# Patient Record
Sex: Male | Born: 1940
Health system: Southern US, Community
[De-identification: ages and names within clinical notes are randomized; demographics above are authoritative.]

## PROBLEM LIST (undated history)

## (undated) DIAGNOSIS — N189 Chronic kidney disease, unspecified: Secondary | ICD-10-CM

## (undated) DIAGNOSIS — H539 Unspecified visual disturbance: Secondary | ICD-10-CM

## (undated) DIAGNOSIS — I10 Essential (primary) hypertension: Secondary | ICD-10-CM

## (undated) DIAGNOSIS — I2510 Atherosclerotic heart disease of native coronary artery without angina pectoris: Secondary | ICD-10-CM

## (undated) DIAGNOSIS — E669 Obesity, unspecified: Secondary | ICD-10-CM

## (undated) DIAGNOSIS — M109 Gout, unspecified: Secondary | ICD-10-CM

## (undated) DIAGNOSIS — M2550 Pain in unspecified joint: Secondary | ICD-10-CM

## (undated) DIAGNOSIS — I77810 Thoracic aortic ectasia: Secondary | ICD-10-CM

## (undated) DIAGNOSIS — I351 Nonrheumatic aortic (valve) insufficiency: Secondary | ICD-10-CM

## (undated) DIAGNOSIS — H409 Unspecified glaucoma: Secondary | ICD-10-CM

## (undated) DIAGNOSIS — R7301 Impaired fasting glucose: Secondary | ICD-10-CM

## (undated) DIAGNOSIS — E7800 Pure hypercholesterolemia, unspecified: Secondary | ICD-10-CM

## (undated) DIAGNOSIS — K760 Fatty (change of) liver, not elsewhere classified: Secondary | ICD-10-CM

## (undated) DIAGNOSIS — E785 Hyperlipidemia, unspecified: Secondary | ICD-10-CM

## (undated) DIAGNOSIS — I739 Peripheral vascular disease, unspecified: Secondary | ICD-10-CM

## (undated) DIAGNOSIS — R591 Generalized enlarged lymph nodes: Secondary | ICD-10-CM

## (undated) HISTORY — DX: Atherosclerotic heart disease of native coronary artery without angina pectoris: I25.10

## (undated) HISTORY — DX: Obesity, unspecified: E66.9

## (undated) HISTORY — DX: Nonrheumatic aortic (valve) insufficiency: I35.1

## (undated) HISTORY — DX: Unspecified glaucoma: H40.9

## (undated) HISTORY — DX: Hyperlipidemia, unspecified: E78.5

## (undated) HISTORY — DX: Peripheral vascular disease, unspecified: I73.9

## (undated) HISTORY — DX: Thoracic aortic ectasia: I77.810

## (undated) HISTORY — DX: Fatty (change of) liver, not elsewhere classified: K76.0

## (undated) HISTORY — DX: Generalized enlarged lymph nodes: R59.1

## (undated) HISTORY — DX: Essential (primary) hypertension: I10

## (undated) HISTORY — DX: Unspecified visual disturbance: H53.9

## (undated) HISTORY — PX: WISDOM TOOTH EXTRACTION: SHX21

## (undated) HISTORY — DX: Gout, unspecified: M10.9

## (undated) HISTORY — DX: Pure hypercholesterolemia, unspecified: E78.00

## (undated) HISTORY — PX: EYE SURGERY: SHX253

## (undated) HISTORY — DX: Impaired fasting glucose: R73.01

## (undated) HISTORY — PX: ADENOIDECTOMY: SUR15

## (undated) HISTORY — PX: CATARACT EXTRACTION: SUR2

## (undated) HISTORY — DX: Pain in unspecified joint: M25.50

## (undated) HISTORY — PX: TONSILLECTOMY: SUR1361

---

## 2001-03-02 ENCOUNTER — Ambulatory Visit (HOSPITAL_COMMUNITY): Admission: RE | Admit: 2001-03-02 | Discharge: 2001-03-03 | Payer: Self-pay | Admitting: Cardiology

## 2005-10-28 ENCOUNTER — Encounter: Admission: RE | Admit: 2005-10-28 | Discharge: 2006-01-26 | Payer: Self-pay | Admitting: Family Medicine

## 2005-10-31 ENCOUNTER — Ambulatory Visit (HOSPITAL_COMMUNITY): Admission: RE | Admit: 2005-10-31 | Discharge: 2005-10-31 | Payer: Self-pay | Admitting: Gastroenterology

## 2007-09-13 HISTORY — PX: CORONARY ANGIOPLASTY WITH STENT PLACEMENT: SHX49

## 2010-08-13 ENCOUNTER — Encounter: Payer: Self-pay | Admitting: Cardiology

## 2011-02-11 ENCOUNTER — Ambulatory Visit (HOSPITAL_COMMUNITY)
Admission: RE | Admit: 2011-02-11 | Discharge: 2011-02-11 | Disposition: A | Discharge status: Home / Self Care | Payer: BC Managed Care – PPO | Source: Ambulatory Visit | Attending: Orthopedic Surgery | Admitting: Orthopedic Surgery

## 2011-02-11 DIAGNOSIS — M7989 Other specified soft tissue disorders: Secondary | ICD-10-CM | POA: Insufficient documentation

## 2013-07-22 ENCOUNTER — Ambulatory Visit (INDEPENDENT_AMBULATORY_CARE_PROVIDER_SITE_OTHER): Payer: BC Managed Care – PPO | Admitting: *Deleted

## 2013-07-22 DIAGNOSIS — E7800 Pure hypercholesterolemia, unspecified: Secondary | ICD-10-CM

## 2013-07-22 LAB — ALT: ALT: 67 U/L — ABNORMAL HIGH (ref 0–53)

## 2013-07-23 LAB — NMR LIPOPROFILE WITH LIPIDS
Cholesterol, Total: 135 mg/dL (ref <200)
HDL Particle Number: 34.3 umol/L (ref >=30.5)
HDL Size: 8.6 nm — ABNORMAL LOW (ref >=9.2)
HDL-C: 37 mg/dL — ABNORMAL LOW (ref >=40)
LDL Particle Number: 785 nmol/L (ref <1000)
Triglycerides: 259 mg/dL — ABNORMAL HIGH (ref <150)

## 2013-07-24 ENCOUNTER — Other Ambulatory Visit: Payer: Self-pay | Admitting: General Surgery

## 2013-07-24 ENCOUNTER — Encounter: Payer: Self-pay | Admitting: General Surgery

## 2013-07-24 DIAGNOSIS — I2510 Atherosclerotic heart disease of native coronary artery without angina pectoris: Secondary | ICD-10-CM

## 2013-07-24 DIAGNOSIS — Z79899 Other long term (current) drug therapy: Secondary | ICD-10-CM

## 2013-08-12 DIAGNOSIS — I2510 Atherosclerotic heart disease of native coronary artery without angina pectoris: Secondary | ICD-10-CM

## 2013-08-12 HISTORY — DX: Atherosclerotic heart disease of native coronary artery without angina pectoris: I25.10

## 2013-08-19 ENCOUNTER — Telehealth: Payer: Self-pay | Admitting: Cardiology

## 2013-08-19 NOTE — Telephone Encounter (Signed)
Called pt and let know I would mail labs. He would have to call IT for cone about id and password for [facility]

## 2013-08-19 NOTE — Telephone Encounter (Signed)
Follow up    Pt called for last lab results to be mailed to him as well as his MY Chart  ID& Pass word.  Thanks!

## 2013-08-30 ENCOUNTER — Ambulatory Visit: Payer: BC Managed Care – PPO | Admitting: Cardiology

## 2013-09-09 ENCOUNTER — Encounter: Payer: Self-pay | Admitting: General Surgery

## 2013-09-09 DIAGNOSIS — I10 Essential (primary) hypertension: Secondary | ICD-10-CM | POA: Insufficient documentation

## 2013-09-09 DIAGNOSIS — I2510 Atherosclerotic heart disease of native coronary artery without angina pectoris: Secondary | ICD-10-CM | POA: Insufficient documentation

## 2013-09-09 DIAGNOSIS — I119 Hypertensive heart disease without heart failure: Secondary | ICD-10-CM

## 2013-09-09 DIAGNOSIS — E7800 Pure hypercholesterolemia, unspecified: Secondary | ICD-10-CM

## 2013-09-09 HISTORY — DX: Essential (primary) hypertension: I10

## 2013-09-09 HISTORY — DX: Pure hypercholesterolemia, unspecified: E78.00

## 2013-09-24 ENCOUNTER — Ambulatory Visit (INDEPENDENT_AMBULATORY_CARE_PROVIDER_SITE_OTHER): Payer: BC Managed Care – PPO | Admitting: Cardiology

## 2013-09-24 ENCOUNTER — Encounter: Payer: Self-pay | Admitting: Cardiology

## 2013-09-24 VITALS — BP 140/82 | HR 65 | Ht 76.0 in | Wt 293.8 lb

## 2013-09-24 DIAGNOSIS — I2510 Atherosclerotic heart disease of native coronary artery without angina pectoris: Secondary | ICD-10-CM

## 2013-09-24 DIAGNOSIS — I10 Essential (primary) hypertension: Secondary | ICD-10-CM

## 2013-09-24 DIAGNOSIS — R7401 Elevation of levels of liver transaminase levels: Secondary | ICD-10-CM

## 2013-09-24 DIAGNOSIS — R740 Nonspecific elevation of levels of transaminase and lactic acid dehydrogenase [LDH]: Secondary | ICD-10-CM

## 2013-09-24 DIAGNOSIS — E7800 Pure hypercholesterolemia, unspecified: Secondary | ICD-10-CM

## 2013-09-24 DIAGNOSIS — R7402 Elevation of levels of lactic acid dehydrogenase (LDH): Secondary | ICD-10-CM

## 2013-09-24 MED ORDER — VALSARTAN 320 MG PO TABS: 320.0000 mg | ORAL_TABLET | Freq: Every day | ORAL | Status: DC

## 2013-09-24 MED ORDER — ATORVASTATIN CALCIUM 80 MG PO TABS: 80.0000 mg | ORAL_TABLET | Freq: Every day | ORAL | Status: DC

## 2013-09-24 MED ORDER — METOPROLOL SUCCINATE ER 100 MG PO TB24: 100.0000 mg | ORAL_TABLET | Freq: Every day | ORAL | Status: DC

## 2013-09-24 NOTE — Patient Instructions (Signed)
Your physician recommends that you continue on your current medications as directed. Please refer to the Current Medication list given to you today.  You have been referred to [facility] [address], [address] [place] [address] Phone: [phone] Hours: Open Monday through Friday from 8:30 am to 5:00 pm They will be contacting you for an appointment within the next week.   Your physician wants you to follow-up in: 1 Year You will receive a reminder letter in the mail two months in advance. If you don't receive a letter, please call our office to schedule the follow-up appointment.

## 2013-09-24 NOTE — Progress Notes (Signed)
[place], [place], [place]  [address] Phone: [phone] Fax:  [phone]  Date:  09/24/2013   ID:  Marcus [name], DOB July 11, 1941, MRN [id number]  PCP:  Gennette Pac, MD  Cardiologist:  Fransico Him, MD     History of Present Illness: Marcus [name] is a 73 y.o. male with a history of ASCAD, HTN and dyslipidemia.  He is doing well.  He denies any chest pain, SOB, DOE, LE edema, dizziness, palpitations or syncope.  He has had a persistent cough due to chronic sinus drainage.  He is not getting any aerobic exercise at present.     Wt Readings from Last 3 Encounters:  09/24/13 293 lb 12.8 oz (133.267 kg)  09/09/13 286 lb (129.729 kg)     Past Medical History  Diagnosis Date  . Coronary artery disease     S/P PCI of the LAD  . Hypertension   . Dyslipidemia   . Obesity   . Gout     Current Outpatient Prescriptions  Medication Sig Dispense Refill  . allopurinol (ZYLOPRIM) 300 MG tablet Take 300 mg by mouth daily.      Marland Kitchen aspirin 81 MG tablet Take 81 mg by mouth daily.      Marland Kitchen atorvastatin (LIPITOR) 80 MG tablet Take 80 mg by mouth daily.      . Dorzolamide HCl-Timolol Mal (COSOPT OP) Apply 1 drop to eye 2 (two) times daily.      . metoprolol succinate (TOPROL-XL) 100 MG 24 hr tablet Take 100 mg by mouth daily. Take with or immediately following a meal.      . nitroGLYCERIN (NITROSTAT) 0.4 MG SL tablet Place 0.4 mg under the tongue every 5 (five) minutes as needed for chest pain.      . Omega-3 Fatty Acids (FISH OIL) 1000 MG CAPS Take 9 capsules by mouth daily.       . valsartan (DIOVAN) 320 MG tablet Take 320 mg by mouth daily.       No current facility-administered medications for this visit.    Allergies:   No Known Allergies  Social History:  The patient  reports that he has been smoking Cigars.  He does not have any smokeless tobacco history on file. He reports that he drinks alcohol. He reports that he does not use illicit drugs.   Family History:   The patient's family history is not on file.   ROS:  Please see the history of present illness.      All other systems reviewed and negative.   PHYSICAL EXAM: VS:  BP 140/82  Pulse 65  Ht _0  (1.93 m)  Wt 293 lb 12.8 oz (133.267 kg)  BMI 35.78 kg/m2 Well nourished, well developed, in no acute distress HEENT: normal Neck: no JVD Cardiac:  normal S1, S2; RRR; no murmur Lungs:  clear to auscultation bilaterally, no wheezing, rhonchi or rales Abd: soft, nontender, no hepatomegaly Ext: no edema Skin: warm and dry Neuro:  CNs 2-12 intact, no focal abnormalities noted  EKG:   NSR with no ST changes    ASSESSMENT AND PLAN:  1. ASCAD   - continue ASA 2. HTN - well controlled  - continue valsartan/metoprolol 3. Dyslipidemia  - continue Atrovastatin/fish oil 4. Obesity with elevated ALT which I suspect is from fatty liver.  We had a long discussion about carrying a lot of weight around his abdomen.  I have strongly encouraged him to join a gym and get into a  routine exercise program and consider weight watchers.  We will recheck his ALT after stopping all alcohol and no tylenol. 5. Elevated ALT -most likely due to fatty liver but will refer to GI  Followup with me in 1 year  Signed, Fransico Him, MD 09/24/2013 8:23 AM

## 2013-12-04 ENCOUNTER — Other Ambulatory Visit: Payer: Self-pay | Admitting: Gastroenterology

## 2013-12-04 DIAGNOSIS — R109 Unspecified abdominal pain: Secondary | ICD-10-CM

## 2013-12-30 ENCOUNTER — Ambulatory Visit
Admission: RE | Admit: 2013-12-30 | Discharge: 2013-12-30 | Disposition: A | Discharge status: Home / Self Care | Payer: BC Managed Care – PPO | Source: Ambulatory Visit | Attending: Gastroenterology | Admitting: Gastroenterology

## 2013-12-30 DIAGNOSIS — R109 Unspecified abdominal pain: Secondary | ICD-10-CM

## 2014-01-16 ENCOUNTER — Telehealth: Payer: Self-pay | Admitting: Cardiology

## 2014-01-16 NOTE — Telephone Encounter (Signed)
New message     Question about upcoming lab test

## 2014-01-16 NOTE — Telephone Encounter (Signed)
Called pt back and let him know what lab test he is scheduled for.

## 2014-01-21 ENCOUNTER — Other Ambulatory Visit: Payer: BC Managed Care – PPO

## 2014-01-27 ENCOUNTER — Other Ambulatory Visit (INDEPENDENT_AMBULATORY_CARE_PROVIDER_SITE_OTHER): Payer: BC Managed Care – PPO

## 2014-01-27 DIAGNOSIS — I2510 Atherosclerotic heart disease of native coronary artery without angina pectoris: Secondary | ICD-10-CM

## 2014-01-27 DIAGNOSIS — Z79899 Other long term (current) drug therapy: Secondary | ICD-10-CM

## 2014-01-27 LAB — ALT: ALT: 74 U/L — ABNORMAL HIGH (ref 0–53)

## 2014-01-28 LAB — NMR LIPOPROFILE WITH LIPIDS
CHOLESTEROL, TOTAL: 105 mg/dL (ref <200)
HDL Particle Number: 26.2 umol/L — ABNORMAL LOW (ref >=30.5)
HDL SIZE: 8.5 nm — AB (ref >=9.2)
HDL-C: 29 mg/dL — AB (ref >=40)
LDL (calc): 42 mg/dL (ref <100)
LDL Particle Number: 649 nmol/L (ref <1000)
LDL SIZE: 19.6 nm — AB (ref >20.5)
LP-IR SCORE: 52 — AB (ref <=45)
Large HDL-P: <1.3 umol/L — ABNORMAL LOW (ref >=4.8)
Large VLDL-P: 1.3 nmol/L (ref <=2.7)
SMALL LDL PARTICLE NUMBER: 510 nmol/L (ref <=527)
Triglycerides: 169 mg/dL — ABNORMAL HIGH (ref <150)
VLDL SIZE: 42.4 nm (ref <=46.6)

## 2014-01-29 ENCOUNTER — Encounter: Payer: Self-pay | Admitting: General Surgery

## 2014-01-29 ENCOUNTER — Other Ambulatory Visit: Payer: Self-pay | Admitting: General Surgery

## 2014-01-29 DIAGNOSIS — E7800 Pure hypercholesterolemia, unspecified: Secondary | ICD-10-CM

## 2014-04-09 ENCOUNTER — Encounter: Payer: Self-pay | Admitting: Cardiology

## 2014-07-31 ENCOUNTER — Telehealth: Payer: Self-pay | Admitting: Cardiology

## 2014-07-31 NOTE — Telephone Encounter (Signed)
Please get a copy of patient's last BMET from PCP

## 2014-08-01 ENCOUNTER — Other Ambulatory Visit (INDEPENDENT_AMBULATORY_CARE_PROVIDER_SITE_OTHER): Payer: BC Managed Care – PPO | Admitting: *Deleted

## 2014-08-01 DIAGNOSIS — E7800 Pure hypercholesterolemia, unspecified: Secondary | ICD-10-CM

## 2014-08-01 LAB — HEPATIC FUNCTION PANEL
ALT: 41 U/L (ref 0–53)
AST: 34 U/L (ref 0–37)
Albumin: 3.7 g/dL (ref 3.5–5.2)
Alkaline Phosphatase: 63 U/L (ref 39–117)
Bilirubin, Direct: 0 mg/dL (ref 0.0–0.3)
Total Bilirubin: 0.8 mg/dL (ref 0.2–1.2)
Total Protein: 7.3 g/dL (ref 6.0–8.3)

## 2014-08-01 NOTE — Telephone Encounter (Signed)
Called Dr. Eddie Dibbles office medical records who st they will fax it over.

## 2014-08-04 ENCOUNTER — Other Ambulatory Visit: Payer: Self-pay | Admitting: Cardiology

## 2014-08-04 LAB — NMR LIPOPROFILE WITH LIPIDS
CHOLESTEROL, TOTAL: 105 mg/dL (ref 100–199)
HDL PARTICLE NUMBER: 25.2 umol/L — AB (ref >=30.5)
HDL Size: 8.7 nm — ABNORMAL LOW (ref >=9.2)
HDL-C: 26 mg/dL — ABNORMAL LOW (ref >39)
LDL CALC: 43 mg/dL (ref 0–99)
LDL Particle Number: 476 nmol/L (ref <1000)
LDL Size: 20 nm (ref >=20.8)
LP-IR Score: 52 — ABNORMAL HIGH (ref <=45)
Large VLDL-P: 2.3 nmol/L (ref <=2.7)
SMALL LDL PARTICLE NUMBER: 279 nmol/L (ref <=527)
Triglycerides: 182 mg/dL — ABNORMAL HIGH (ref 0–149)
VLDL SIZE: 40.4 nm (ref <=46.6)

## 2014-08-06 ENCOUNTER — Telehealth: Payer: Self-pay | Admitting: Cardiology

## 2014-08-06 DIAGNOSIS — E7800 Pure hypercholesterolemia, unspecified: Secondary | ICD-10-CM

## 2014-08-06 NOTE — Telephone Encounter (Signed)
Patient informed of results and verbal understanding expressed.  Informed patient fasting labs need to be redrawn in a year.  Labs ordered and patient to call back at his convenience to make an appointment.

## 2014-08-06 NOTE — Telephone Encounter (Signed)
New Msg   Patient returning call. Please contact at [phone].

## 2014-08-06 NOTE — Telephone Encounter (Signed)
-----  Message from Aris Georgia, [facility] sent at 08/04/2014 11:59 AM EST ----- RF: CAD, insulin resistance, age - LDL goal < 70, non-HDL goal < 100, LDL-P goal < 1000. Meds: Atorvastatin 80 mg qd, fish oil 4 daily (h/o not taking fish oil). LDL, non-HDL, and LDL-P all at goal currently. TG remain slightly elevated ~ 146m/dL. ALT historically b/t 60-75 U/L, but normal today at 41 Plan: 1. Continue atorvastatin 80 mg qd and fish oil  2. Recheck NMR LipoProfile and hepatic panel in 12 months. Please notify patient, and set up labs. Thanks.

## 2014-08-18 ENCOUNTER — Encounter: Payer: Self-pay | Admitting: Cardiology

## 2014-08-28 ENCOUNTER — Telehealth: Payer: Self-pay | Admitting: Cardiology

## 2014-08-28 NOTE — Telephone Encounter (Signed)
New message      Talk to Katy----pt is aware she is out today and want to wait until she returns

## 2014-08-28 NOTE — Telephone Encounter (Signed)
Message sent to [facility]

## 2014-08-29 NOTE — Telephone Encounter (Signed)
Pt extremely upset because he got blood work done 11/20 and he recently received a bill from [facility] for over $70. Patient st that the order was placed incorrectly, and that [facility] should pay for it. He has gotten labs drawn here for years and has never gotten a bill. Apologized to patient that he is so angry and told him it will be looked into. Will F/U with patient next week.

## 2014-09-01 NOTE — Telephone Encounter (Signed)
Left detailed voicemail for patient.  I will be in [facility] [address] tomorrow and I explained in voicemail that in the morning I will call [facility] and see if they can give me an explanation of charges and that I will return his call before lunchtime.  Hopefully we can resolve some confusion.

## 2014-09-01 NOTE — Telephone Encounter (Signed)
I left him a voicemail to call me back

## 2014-10-06 ENCOUNTER — Ambulatory Visit: Payer: BC Managed Care – PPO | Admitting: Cardiology

## 2014-10-23 ENCOUNTER — Ambulatory Visit (INDEPENDENT_AMBULATORY_CARE_PROVIDER_SITE_OTHER): Payer: BLUE CROSS/BLUE SHIELD | Admitting: Cardiology

## 2014-10-23 ENCOUNTER — Encounter: Payer: Self-pay | Admitting: Cardiology

## 2014-10-23 VITALS — BP 134/80 | HR 60 | Ht 76.0 in | Wt 290.8 lb

## 2014-10-23 DIAGNOSIS — I2510 Atherosclerotic heart disease of native coronary artery without angina pectoris: Secondary | ICD-10-CM | POA: Diagnosis not present

## 2014-10-23 DIAGNOSIS — E7800 Pure hypercholesterolemia, unspecified: Secondary | ICD-10-CM

## 2014-10-23 DIAGNOSIS — I10 Essential (primary) hypertension: Secondary | ICD-10-CM

## 2014-10-23 NOTE — Patient Instructions (Signed)
Your physician recommends that you continue on your current medications as directed. Please refer to the Current Medication list given to you today.  Your physician wants you to follow-up in: 1 year with Dr. Radford Pax. You will receive a reminder letter in the mail two months in advance. If you don't receive a letter, please call our office to schedule the follow-up appointment.

## 2014-10-23 NOTE — Progress Notes (Signed)
Cardiology Office Note   Date:  10/23/2014   ID:  Marcus [name], DOB Apr 06, 1941, MRN [id number]  PCP:  Gennette Pac, MD  Cardiologist:   Sueanne Margarita, MD   Chief Complaint  Patient presents with  . Coronary Artery Disease  . Hypertension  . Hyperlipidemia      History of Present Illness: Marcus [name] is a 74 y.o. male with a history of ASCAD, HTN and dyslipidemia.He has a history of elevated ALT and was evaluated by GI and US showed an enlarged liver.  He stopped ETOH and tylenol and  ALT normalized.  He is doing well. He denies any anginal chest pain, SOB, DOE, dizziness, palpitations or syncope. He has chronic LE edema which is stable.  He occasionally has some numbness and tingling in his LE.  He is not getting any aerobic exercise at present.      Past Medical History  Diagnosis Date  . Coronary artery disease     S/P PCI of the LAD  . Hypertension   . Dyslipidemia   . Obesity   . Gout   . LAD (lymphadenopathy)   . Hyperlipidemia   . Joint pain   . Elevated fasting glucose   . Fatty liver     Past Surgical History  Procedure Laterality Date  . Coronary angioplasty with stent placement  2009  . Wisdom tooth extraction    . Tonsillectomy    . Adenoidectomy       Current Outpatient Prescriptions  Medication Sig Dispense Refill  . allopurinol (ZYLOPRIM) 300 MG tablet Take 300 mg by mouth daily.    Marland Kitchen aspirin 81 MG tablet Take 81 mg by mouth daily.    Marland Kitchen atorvastatin (LIPITOR) 80 MG tablet TAKE 1 TABLET DAILY 90 tablet 0  . Dorzolamide HCl-Timolol Mal (COSOPT OP) Apply 1 drop to eye 2 (two) times daily.    . metoprolol succinate (TOPROL-XL) 100 MG 24 hr tablet TAKE 1 TABLET DAILY. TAKE WITH OR IMMEDIATELY FOLLOWING A MEAL. 90 tablet 0  . minocycline (MINOCIN,DYNACIN) 100 MG capsule Take 100 mg by mouth as needed. For skin acne  3  . nitroGLYCERIN (NITROSTAT) 0.4 MG SL tablet Place 0.4 mg under the tongue every 5 (five) minutes as needed for chest  pain.    . Omega-3 Fatty Acids (FISH OIL) 1000 MG CAPS Take 9 capsules by mouth daily.     . valsartan (DIOVAN) 320 MG tablet TAKE 1 TABLET DAILY 90 tablet 0   No current facility-administered medications for this visit.    Allergies:   Review of patient's allergies indicates no known allergies.    Social History:  The patient  reports that he has been smoking Cigars.  He does not have any smokeless tobacco history on file. He reports that he drinks alcohol. He reports that he does not use illicit drugs.   Family History:  The patient's family history includes Breast cancer in his mother; CAD in his brother and sister; Heart attack in his father; Rheumatic fever in his father.    ROS:  Please see the history of present illness.   Otherwise, review of systems are positive for none.   All other systems are reviewed and negative.    PHYSICAL EXAM: VS:  BP 134/80 mmHg  Pulse 60  Ht _0  (1.93 m)  Wt 290 lb 12.8 oz (131.906 kg)  BMI 35.41 kg/m2 , BMI Body mass index is 35.41 kg/(m^2). GEN: Well nourished, well developed, in no acute distress  HEENT: normal Neck: no JVD, carotid bruits, or masses Cardiac: RRR; no murmurs, rubs, or gallops, trace edema  Respiratory:  clear to auscultation bilaterally, normal work of breathing GI: soft, nontender, nondistended, + BS MS: no deformity or atrophy Skin: warm and dry, no rash Neuro:  Strength and sensation are intact Psych: euthymic mood, full affect   EKG:  EKG is ordered today. The ekg ordered today demonstrates NSR at 60bpm with IRBBB   Recent Labs: 08/01/2014: ALT 41    Lipid Panel    Component Value Date/Time   TRIG 182* 08/01/2014 0835   LDLCALC 43 08/01/2014 0835      Wt Readings from Last 3 Encounters:  10/23/14 290 lb 12.8 oz (131.906 kg)  09/24/13 293 lb 12.8 oz (133.267 kg)  09/09/13 286 lb (129.729 kg)       ASSESSMENT AND PLAN:  1. ASCAD- s/p remote LAD PCI - continue ASA 2. HTN - well  controlled - continue valsartan/metoprolol 3. Dyslipidemia - LDL at goal - continue Atrovastatin/fish oil 4. Obesity . We had a long discussion about carrying a lot of weight around his abdomen. I have strongly encouraged him to join a gym and get into a routine exercise program and consider weight watchers. 5. Elevated ALT - resolved after stopping ETOH 6. Numbness and tingling of LE - I recommended that he discuss this with his PCP    Current medicines are reviewed at length with the patient today.  The patient does not have concerns regarding medicines.  The following changes have been made:  no change  Labs/ tests ordered today include: None No orders of the defined types were placed in this encounter.     Disposition:   FU with me in 1 year   Signed, Sueanne Margarita, MD  10/23/2014 9:48 AM    [address], [place], [place]  [address] Phone: [phone]; Fax: [phone]

## 2014-11-02 ENCOUNTER — Other Ambulatory Visit: Payer: Self-pay | Admitting: Cardiology

## 2015-01-21 ENCOUNTER — Other Ambulatory Visit: Payer: Self-pay | Admitting: *Deleted

## 2015-01-21 MED ORDER — METOPROLOL SUCCINATE ER 100 MG PO TB24: ORAL_TABLET | ORAL | Status: DC

## 2015-01-21 MED ORDER — ATORVASTATIN CALCIUM 80 MG PO TABS: 80.0000 mg | ORAL_TABLET | Freq: Every day | ORAL | Status: DC

## 2015-01-21 MED ORDER — VALSARTAN 320 MG PO TABS: 320.0000 mg | ORAL_TABLET | Freq: Every day | ORAL | Status: DC

## 2015-01-21 NOTE — Telephone Encounter (Signed)
Rx refill sent to patient pharmacy

## 2016-01-11 ENCOUNTER — Encounter: Payer: Self-pay | Admitting: Cardiology

## 2016-02-02 ENCOUNTER — Other Ambulatory Visit: Payer: Self-pay | Admitting: Cardiology

## 2016-02-02 ENCOUNTER — Telehealth: Payer: Self-pay | Admitting: Cardiology

## 2016-02-02 DIAGNOSIS — E7800 Pure hypercholesterolemia, unspecified: Secondary | ICD-10-CM

## 2016-02-02 NOTE — Telephone Encounter (Signed)
Rx refill sent to pharmacy.

## 2016-02-02 NOTE — Telephone Encounter (Signed)
New Message  Pt sched OV w/ [facility]- 8/21 _0 - pt stated he would probably need lab work beforehand- scheduled labs for 04/18/16- no orders in syst. Please call back and discuss.

## 2016-03-26 NOTE — Telephone Encounter (Signed)
FLP and ALT

## 2016-03-28 NOTE — Telephone Encounter (Signed)
Left message for patient to come fasting to lab visit 8/7. Orders placed and linked to appointment.

## 2016-04-18 ENCOUNTER — Other Ambulatory Visit (INDEPENDENT_AMBULATORY_CARE_PROVIDER_SITE_OTHER): Payer: 59

## 2016-04-18 ENCOUNTER — Encounter: Payer: Self-pay | Admitting: *Deleted

## 2016-04-18 DIAGNOSIS — E7800 Pure hypercholesterolemia, unspecified: Secondary | ICD-10-CM

## 2016-04-18 LAB — LIPID PANEL
CHOLESTEROL: 104 mg/dL — AB (ref 125–200)
HDL: 28 mg/dL — AB (ref >=40)
LDL Cholesterol: 35 mg/dL (ref <130)
TRIGLYCERIDES: 207 mg/dL — AB (ref <150)
Total CHOL/HDL Ratio: 3.7 Ratio (ref <=5.0)
VLDL: 41 mg/dL — ABNORMAL HIGH (ref <30)

## 2016-04-18 LAB — ALT: ALT: 47 U/L — ABNORMAL HIGH (ref 9–46)

## 2016-05-01 NOTE — Progress Notes (Signed)
Cardiology Office Note    Date:  05/02/2016   ID:  Marcus [name], DOB 06/28/1941, MRN [id number]  PCP:  Gennette Pac, MD  Cardiologist:  Fransico Him, MD   Chief Complaint  Patient presents with  . Coronary Artery Disease  . Hypertension  . Hyperlipidemia    History of Present Illness:  Marcus [name] is a 75 y.o. male with a history of ASCAD, HTN and dyslipidemia.He has a history of elevated ALT and was evaluated by GI and US showed an enlarged liver.  He stopped ETOH and tylenol and  ALT normalized.  He is doing well. He denies any anginal chest pain, SOB, DOE, dizziness, palpitations or syncope. He has chronic LE edema which is stable.  He says that he has been having some numbness of his legs and feet but no pain with ambulation.    Past Medical History:  Diagnosis Date  . Benign essential HTN 09/09/2013  . Coronary artery disease 08/2013   S/P PCI of the LAD  . Dyslipidemia   . Elevated fasting glucose   . Fatty liver   . Gout   . Hyperlipidemia   . Hypertension   . Joint pain   . LAD (lymphadenopathy)   . Obesity   . Pure hypercholesterolemia 09/09/2013    Past Surgical History:  Procedure Laterality Date  . ADENOIDECTOMY    . CORONARY ANGIOPLASTY WITH STENT PLACEMENT  2009  . TONSILLECTOMY    . WISDOM TOOTH EXTRACTION      Current Medications: Outpatient Medications Prior to Visit  Medication Sig Dispense Refill  . allopurinol (ZYLOPRIM) 300 MG tablet Take 300 mg by mouth daily.    Marland Kitchen aspirin 81 MG tablet Take 81 mg by mouth daily.    Marland Kitchen atorvastatin (LIPITOR) 80 MG tablet Take 1 tablet by mouth  daily 90 tablet 0  . Dorzolamide HCl-Timolol Mal (COSOPT OP) Apply 1 drop to eye 2 (two) times daily.    . metoprolol succinate (TOPROL-XL) 100 MG 24 hr tablet TAKE 1 TABLET DAILY WITH OR IMMEDIATELY FOLLOWING A  MEAL 90 tablet 0  . minocycline (MINOCIN,DYNACIN) 100 MG capsule Take 100 mg by mouth as needed. For skin acne  3  . nitroGLYCERIN  (NITROSTAT) 0.4 MG SL tablet Place 0.4 mg under the tongue every 5 (five) minutes as needed for chest pain.    . Omega-3 Fatty Acids (FISH OIL) 1000 MG CAPS Take 9 capsules by mouth daily.     . valsartan (DIOVAN) 320 MG tablet Take 1 tablet by mouth  daily 90 tablet 0   No facility-administered medications prior to visit.      Allergies:   Review of patient's allergies indicates no known allergies.   Social History   Social History  . Marital status: Married    Spouse name: N/A  . Number of children: N/A  . Years of education: N/A   Social History Main Topics  . Smoking status: Current Every Day Smoker    Types: Cigars  . Smokeless tobacco: None  . Alcohol use Yes     Comment: 2-3 beers daily  . Drug use: No  . Sexual activity: Not Asked   Other Topics Concern  . None   Social History Narrative  . None     Family History:  The patient's family history includes Breast cancer in his mother; CAD in his brother and sister; Heart attack in his father; Rheumatic fever in his father.   ROS:   Please see the  history of present illness.    ROS All other systems reviewed and are negative.   PHYSICAL EXAM:   VS:  BP (!) 136/56   Pulse 67   Ht _0  (1.905 m)   Wt 294 lb 12.8 oz (133.7 kg)   BMI 36.85 kg/m    GEN: Well nourished, well developed, in no acute distress  HEENT: normal  Neck: no JVD, carotid bruits, or masses Cardiac: RRR; no murmurs, rubs, or gallops,no edema.  Intact distal pulses bilaterally.  Respiratory:  clear to auscultation bilaterally, normal work of breathing GI: soft, nontender, nondistended, + BS MS: no deformity or atrophy  Skin: warm and dry, no rash Neuro:  Alert and Oriented x 3, Strength and sensation are intact Psych: euthymic mood, full affect  Wt Readings from Last 3 Encounters:  05/02/16 294 lb 12.8 oz (133.7 kg)  10/23/14 290 lb 12.8 oz (131.9 kg)  09/24/13 293 lb 12.8 oz (133.3 kg)      Studies/Labs Reviewed:   EKG:  EKG is   ordered today.  The ekg ordered today demonstrates NSR at 67 bpm with no ST changes  Recent Labs: 04/18/2016: ALT 47   Lipid Panel    Component Value Date/Time   CHOL 104 (L) 04/18/2016 0922   CHOL 105 08/01/2014 0835   TRIG 207 (H) 04/18/2016 0922   TRIG 182 (H) 08/01/2014 0835   HDL 28 (L) 04/18/2016 0922   HDL 26 (L) 08/01/2014 0835   CHOLHDL 3.7 04/18/2016 0922   VLDL 41 (H) 04/18/2016 0922   LDLCALC 35 04/18/2016 0922   LDLCALC 43 08/01/2014 0835    Additional studies/ records that were reviewed today include:  none    ASSESSMENT:    1. Atherosclerosis of native coronary artery of native heart without angina pectoris   2. Benign essential HTN   3. Pure hypercholesterolemia   4. Numbness and tingling of both legs below knees      PLAN:  In order of problems listed above:  1. ASCAD s/p PCI of the LAD with no anginal symptoms.  Continue ASA/statin/BB 2. HTN - BP controlled on current meds.  Continue ARB and BB.  Get copy of BMET from PCP 3. Dyslipidemia - LDL goal < 70.  Continue statin. LDL at goal (35) by lipids 04/2016 4. LE numbness with no pain with ambulating - he PT pulses are trace so I will get LE arterial dopplers to assess.    Medication Adjustments/Labs and Tests Ordered: Current medicines are reviewed at length with the patient today.  Concerns regarding medicines are outlined above.  Medication changes, Labs and Tests ordered today are listed in the Patient Instructions below.  There are no Patient Instructions on file for this visit.   Signed, Fransico Him, MD  05/02/2016 1:32 PM    [facility] [place], [place], [place]  [address] Phone: [phone]; Fax: [phone]

## 2016-05-02 ENCOUNTER — Encounter: Payer: Self-pay | Admitting: Cardiology

## 2016-05-02 ENCOUNTER — Ambulatory Visit (INDEPENDENT_AMBULATORY_CARE_PROVIDER_SITE_OTHER): Payer: 59 | Admitting: Cardiology

## 2016-05-02 VITALS — BP 136/56 | HR 67 | Ht 75.0 in | Wt 294.8 lb

## 2016-05-02 DIAGNOSIS — R202 Paresthesia of skin: Secondary | ICD-10-CM

## 2016-05-02 DIAGNOSIS — I2510 Atherosclerotic heart disease of native coronary artery without angina pectoris: Secondary | ICD-10-CM | POA: Diagnosis not present

## 2016-05-02 DIAGNOSIS — I10 Essential (primary) hypertension: Secondary | ICD-10-CM | POA: Diagnosis not present

## 2016-05-02 DIAGNOSIS — E7800 Pure hypercholesterolemia, unspecified: Secondary | ICD-10-CM | POA: Diagnosis not present

## 2016-05-02 DIAGNOSIS — R200 Anesthesia of skin: Secondary | ICD-10-CM

## 2016-05-02 MED ORDER — ATORVASTATIN CALCIUM 80 MG PO TABS: 80.0000 mg | ORAL_TABLET | Freq: Every day | ORAL | 3 refills | Status: DC

## 2016-05-02 MED ORDER — VALSARTAN 320 MG PO TABS: 320.0000 mg | ORAL_TABLET | Freq: Every day | ORAL | 3 refills | Status: DC

## 2016-05-02 MED ORDER — METOPROLOL SUCCINATE ER 100 MG PO TB24: ORAL_TABLET | ORAL | 3 refills | Status: DC

## 2016-05-02 NOTE — Patient Instructions (Signed)
Medication Instructions:  Your physician recommends that you continue on your current medications as directed. Please refer to the Current Medication list given to you today.   Labwork: None  Testing/Procedures: Your physician has requested that you have a lower extremity arterial duplex. During this test, ultrasound is used to evaluate arterial blood flow in the legs. Allow one hour for this exam. There are no restrictions or special instructions.   Follow-Up: Your physician wants you to follow-up in: 1 year with Dr. Radford Pax. You will receive a reminder letter in the mail two months in advance. If you don't receive a letter, please call our office to schedule the follow-up appointment.   Any Other Special Instructions Will Be Listed Below (If Applicable).     If you need a refill on your cardiac medications before your next appointment, please call your pharmacy.

## 2016-05-26 ENCOUNTER — Other Ambulatory Visit: Payer: Self-pay | Admitting: Cardiology

## 2016-05-26 DIAGNOSIS — R200 Anesthesia of skin: Secondary | ICD-10-CM

## 2016-05-30 ENCOUNTER — Ambulatory Visit (HOSPITAL_COMMUNITY)
Admission: RE | Admit: 2016-05-30 | Discharge: 2016-05-30 | Disposition: A | Discharge status: Home / Self Care | Payer: 59 | Source: Ambulatory Visit | Attending: Cardiology | Admitting: Cardiology

## 2016-05-30 DIAGNOSIS — I70203 Unspecified atherosclerosis of native arteries of extremities, bilateral legs: Secondary | ICD-10-CM | POA: Insufficient documentation

## 2016-05-30 DIAGNOSIS — I10 Essential (primary) hypertension: Secondary | ICD-10-CM | POA: Insufficient documentation

## 2016-05-30 DIAGNOSIS — R200 Anesthesia of skin: Secondary | ICD-10-CM | POA: Diagnosis not present

## 2016-05-30 DIAGNOSIS — I2510 Atherosclerotic heart disease of native coronary artery without angina pectoris: Secondary | ICD-10-CM | POA: Insufficient documentation

## 2016-05-30 DIAGNOSIS — R202 Paresthesia of skin: Secondary | ICD-10-CM | POA: Diagnosis not present

## 2016-05-30 DIAGNOSIS — I739 Peripheral vascular disease, unspecified: Secondary | ICD-10-CM

## 2016-05-30 DIAGNOSIS — R0989 Other specified symptoms and signs involving the circulatory and respiratory systems: Secondary | ICD-10-CM | POA: Diagnosis not present

## 2016-05-30 DIAGNOSIS — R208 Other disturbances of skin sensation: Secondary | ICD-10-CM

## 2016-06-06 ENCOUNTER — Telehealth: Payer: Self-pay

## 2016-06-06 DIAGNOSIS — I70202 Unspecified atherosclerosis of native arteries of extremities, left leg: Secondary | ICD-10-CM

## 2016-06-06 NOTE — Telephone Encounter (Signed)
Repeat study ordered to be scheduled in 1 year.

## 2016-06-06 NOTE — Telephone Encounter (Signed)
-----  Message from Sueanne Margarita, MD sent at 05/31/2016  9:37 AM EDT ----- 30-49% left common femoral stenosis - repeat study in 1 year

## 2017-02-09 ENCOUNTER — Telehealth: Payer: Self-pay | Admitting: Cardiology

## 2017-02-09 NOTE — Telephone Encounter (Signed)
Informed patient labs will be requested from PCP and a plan will be made from there. He was grateful for call.

## 2017-02-09 NOTE — Telephone Encounter (Signed)
New message    Pt is calling to find out if he needs to have blood work before appt in September with Dr. Radford Pax. There is no order in the computer. He asked if he could use the blood work he had done at his physical on 02/08/17 or if he would need to have it done again.

## 2017-02-14 NOTE — Telephone Encounter (Signed)
Labs requested from PCP.  

## 2017-03-20 ENCOUNTER — Telehealth: Payer: Self-pay | Admitting: Cardiology

## 2017-03-20 DIAGNOSIS — E785 Hyperlipidemia, unspecified: Secondary | ICD-10-CM

## 2017-03-20 MED ORDER — FENOFIBRATE 48 MG PO TABS: 48.0000 mg | ORAL_TABLET | Freq: Every day | ORAL | 1 refills | Status: DC

## 2017-03-20 NOTE — Telephone Encounter (Signed)
Notes recorded by Theodoro Parma, RN on 03/14/2017 at 2:02 PM EDT Left message to call back. ------  Notes recorded by Leeroy Bock, RPH on 02/23/2017 at 1:22 PM EDT Agree pt needs to reduce fish oil to 4g daily as a max. Would recommend starting fenofibrate 58m daily as well (lower dose based on reduced eGFR). Advise pt to limit sweets, alcohol, and carbs. Recheck lipids in 3 months.   03/20/17--spoke with patient about recommendations, pt verbalized understanding, agreed with plan, fasting lipid profile scheduled for 06/13/17.

## 2017-03-20 NOTE — Telephone Encounter (Signed)
Follow Up:    Returning Dr Theodosia Blender nurse call from last week.He said he did not have a name.,

## 2017-03-23 ENCOUNTER — Ambulatory Visit (INDEPENDENT_AMBULATORY_CARE_PROVIDER_SITE_OTHER): Payer: 59 | Admitting: Neurology

## 2017-03-23 ENCOUNTER — Encounter: Payer: Self-pay | Admitting: Neurology

## 2017-03-23 VITALS — BP 116/67 | HR 62 | Ht 75.0 in | Wt 292.6 lb

## 2017-03-23 DIAGNOSIS — G629 Polyneuropathy, unspecified: Secondary | ICD-10-CM | POA: Diagnosis not present

## 2017-03-23 NOTE — Patient Instructions (Signed)
Remember to drink plenty of fluid, eat healthy meals and do not skip any meals. Try to eat protein with a every meal and eat a healthy snack such as fruit or nuts in between meals. Try to keep a regular sleep-wake schedule and try to exercise daily, particularly in the form of walking, 20-30 minutes a day, if you can.   As far as your medications are concerned, I would like to suggest: Alpha lipoic acid 400-650m daily  As far as diagnostic testing: Labs  Our phone number is [phone] We also have an after hours call service for urgent matters and there is a physician on-call for urgent questions. For any emergencies you know to call 911 or go to the nearest emergency room  Causes of peripheral neuropathy: the most common being diabetes or pre-diabetes. About 20 million people in the [place] have some form of peripheral neuropathy. This is a condition that develops as a result of damage to the peripheral nervous system. There are multiple causes including metabolic, toxic, infectious and endocrine disorders, small vessel disease, autoimmune diseases, and others. We'll perform extensive serum neuropathy screening.

## 2017-03-23 NOTE — Progress Notes (Signed)
[facility]    Provider:  Dr Jaynee Eagles Referring Provider: Hulan Fess, MD Primary Care Physician:  Hulan Fess, MD  CC:  Numbness in the feet  HPI:  Marcus [name] is a lovely 76 y.o. male here as a referral from Dr. Rex Kras for numbness in the feet. Past medical history of hypertension, mixed dyslipidemia, obesity, gout, elevated fasting glucose is, fatty liver, glaucoma, chronic kidney disease and history of coronary artery disease. Symptoms started 5 years ago maybe longer in the ankles with numbness on both feet. Spread to the rest of the foot and the bottom of the foot. He is having difficulty feeling hte pedals on the car. Has to look down to see where his feet are. He feels less steady on his feet. He is concerned about his driving. Slowly progressive. No pain, just numbness. Rarely some sharp pains in the toes. No cramping. No current back pain or radicular symptoms. He fell on the ice in February but nothing since then. No symptoms in the fingers. He has discoloration on the left foot. 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 No other focal neurologic deficits, associated symptoms, inciting events or modifiable factors.  Reviewed notes, labs and imaging from outside physicians, which showed:   This is a 76 year old male here for evaluation of neuropathy. Reviewed primary care notes. TSH normal. Creatinine 1.62 and GFR 42. Elevated AST and ALT. Recommendations to avoid Tylenol and alcohol. He continues aspirin, statin and beta blocker. Increasing numbness in both feet that is affecting his ability to drive which is his occupation and he would like to speak with a neurologist. Reviewed exam which was normal including general, head, eyes, ears, nose, throat, neck, lungs, heart, abdomen, extremities, neurologic, skin. He also had a temporal headache and was evaluated with ESR and CRP. CMP was drawn 01/29/2017 with BUN 36 and creatinine 1.69. ALP 48, AST 33, ALT  38. LDL 18. TSH 2.82. Vitamin B12 517.  Review of Systems: Patient complains of symptoms per HPI as well as the following symptoms: swelling in legs, blurred vision, cough, snoring, easy bleeding, snoring, headache, runny nose. Pertinent negatives and positives per HPI. All others negative.   Social History   Social History  . Marital status: Married    Spouse name: N/A  . Number of children: N/A  . Years of education: N/A   Occupational History  . Not on file.   Social History Main Topics  . Smoking status: Current Every Day Smoker    Types: Cigars  . Smokeless tobacco: Never Used     Comment: daily  . Alcohol use 0.6 oz/week    1 Cans of beer per week     Comment: 2-3 beers daily  . Drug use: No  . Sexual activity: Not on file   Other Topics Concern  . Not on file   Social History Narrative  . No narrative on file    Family History  Problem Relation Age of Onset  . Breast cancer Mother   . Heart attack Father   . Rheumatic fever Father   . CAD Sister   . Multiple sclerosis Sister   . CAD Brother   . Neuropathy Neg Hx     Past Medical History:  Diagnosis Date  . Benign essential HTN 09/09/2013  . Coronary artery disease 08/2013   S/P PCI of the LAD  . Dyslipidemia   . Elevated fasting glucose   . Fatty liver   .  Glaucoma   . Gout   . Hyperlipidemia   . Hypertension   . Joint pain   . LAD (lymphadenopathy)   . Obesity   . Pure hypercholesterolemia 09/09/2013  . Vision abnormalities     Past Surgical History:  Procedure Laterality Date  . ADENOIDECTOMY    . CATARACT EXTRACTION    . CORONARY ANGIOPLASTY WITH STENT PLACEMENT  2009  . EYE SURGERY    . TONSILLECTOMY    . WISDOM TOOTH EXTRACTION      Current Outpatient Prescriptions  Medication Sig Dispense Refill  . allopurinol (ZYLOPRIM) 300 MG tablet Take 300 mg by mouth daily.    Marland Kitchen aspirin 81 MG tablet Take 81 mg by mouth daily.    Marland Kitchen atorvastatin (LIPITOR) 80 MG tablet Take 1 tablet (80 mg  total) by mouth daily. 90 tablet 3  . DOCOSAHEXAENOIC ACID PO 3 g.    . dorzolamide-timolol (COSOPT) 22.3-6.8 MG/ML ophthalmic solution Place 1 drop into both eyes 2 (two) times daily.    . fenofibrate (TRICOR) 48 MG tablet Take 1 tablet (48 mg total) by mouth daily. 90 tablet 1  . latanoprost (XALATAN) 0.005 % ophthalmic solution Place 1 drop into both eyes daily.    . metoprolol succinate (TOPROL-XL) 100 MG 24 hr tablet TAKE 1 TABLET DAILY WITH OR IMMEDIATELY FOLLOWING A  MEAL 90 tablet 3  . minocycline (MINOCIN,DYNACIN) 100 MG capsule Take 100 mg by mouth as needed. For skin acne  3  . nitroGLYCERIN (NITROSTAT) 0.4 MG SL tablet Place 0.4 mg under the tongue every 5 (five) minutes as needed for chest pain.    . Omega-3 Fatty Acids (FISH OIL) 1000 MG CAPS 2 capsules (2070m) by mouth two times a day    . valsartan (DIOVAN) 320 MG tablet Take 1 tablet (320 mg total) by mouth daily. 90 tablet 3   No current facility-administered medications for this visit.     Allergies as of 03/23/2017  . (No Known Allergies)    Vitals: BP 116/67 (BP Location: Right Arm, Patient Position: Sitting, Cuff Size: Normal)   Pulse 62   Ht _0  (1.905 m)   Wt 292 lb 9.6 oz (132.7 kg)   BMI 36.57 kg/m  Last Weight:  Wt Readings from Last 1 Encounters:  03/23/17 292 lb 9.6 oz (132.7 kg)   Last Height:   Ht Readings from Last 1 Encounters:  03/23/17 _1  (1.905 m)    Physical exam: Exam: Gen: NAD, conversant, well nourised, obese, well groomed                     CV: RRR, no MRG. No Carotid Bruits. +peripheral edema left foot > right, warm, nontender Eyes: Conjunctivae clear without exudates or hemorrhage  Neuro: Detailed Neurologic Exam  Speech:    Speech is normal; fluent and spontaneous with normal comprehension.  Cognition:    The patient is oriented to person, place, and time;     recent and remote memory intact;     language fluent;     normal attention, concentration,     fund of  knowledge Cranial Nerves:    The pupils are equal, round, and reactive to light. The fundi are normal and spontaneous venous pulsations are present. Visual fields are full to finger confrontation but decreased on the right due to glaucomq. Extraocular movements are intact. Trigeminal sensation is intact and the muscles of mastication are normal. Right ptosis. The palate elevates in the midline. Hearing  intact. Voice is normal. Shoulder shrug is normal. The tongue has normal motion without fasciculations.   Coordination:    Normal finger to nose and heel to shin.   Gait:    Heel-toe intact, imbalance and difficulty with tandem gait.   Motor Observation:    No asymmetry, no atrophy, and no involuntary movements. Tone:    Normal muscle tone.    Posture:    Posture is normal. normal erect    Strength:    Strength is V/V in the upper and lower limbs.      Sensation: decrease of all sensory modalities (temp, pin prick, vibration, proprioception) distally in the LE nml UE     Reflex Exam:  DTR's:    Absent Ajs otherwise deep tendon reflexes in the upper and lower extremities are brisk bilaterally.   Toes:    The toes are equivocal bilaterally.   Clonus:    Clonus is absent.      Assessment/Plan:  76 year old with distal symmetric polyneuropathy in the feet with decrease of all sensory modalities (temp, pin prick, vibration, proprioception).  Causes of peripheral neuropathy: the most common being diabetes or pre-diabetes. About 20 million people in the [place] states have some form of peripheral neuropathy. This is a condition that develops as a result of damage to the peripheral nervous system. There are multiple causes including metabolic, toxic, infectious and endocrine disorders, small vessel disease, autoimmune diseases, and others. We'll perform extensive serum neuropathy screening. Can consider emg/ncs afterwards.  Patient's risk factors include CKD, CAD and elevated glucose.  Could also be due to long-term exposure to 16+ beers a week. Discussed with patient, advised no more than one beer a day on average.  Discussed emg/ncs, patient declines for now.   Orders Placed This Encounter  Procedures  . Hemoglobin A1c  . B. burgdorfi Antibody  . Angiotensin converting enzyme  . ANA w/Reflex  . Sjogren's syndrome antibods(ssa + ssb)  . Pan-ANCA  . RPR  . Hepatitis C antibody  . Tissue transglutaminase, IgA  . Gliadin antibodies, serum  . Rheumatoid factor  . Heavy metals, blood  . Vitamin B6  . Multiple Myeloma Panel (SPEP&IFE w/QIG)  . Vitamin B1  . Folate    Cc: Dr. Simmie Davies, [facility] [address] [place], [place] [address]  Phone [phone] Fax [phone]

## 2017-03-30 ENCOUNTER — Telehealth: Payer: Self-pay | Admitting: *Deleted

## 2017-03-30 LAB — MULTIPLE MYELOMA PANEL, SERUM
ALBUMIN SERPL ELPH-MCNC: 3.8 g/dL (ref 2.9–4.4)
ALPHA 1: 0.2 g/dL (ref 0.0–0.4)
ALPHA2 GLOB SERPL ELPH-MCNC: 0.9 g/dL (ref 0.4–1.0)
Albumin/Glob SerPl: 1.2 (ref 0.7–1.7)
B-GLOBULIN SERPL ELPH-MCNC: 1.1 g/dL (ref 0.7–1.3)
GAMMA GLOB SERPL ELPH-MCNC: 0.9 g/dL (ref 0.4–1.8)
GLOBULIN, TOTAL: 3.2 g/dL (ref 2.2–3.9)
IGG (IMMUNOGLOBIN G), SERUM: 825 mg/dL (ref 700–1600)
IgA/Immunoglobulin A, Serum: 312 mg/dL (ref 61–437)
IgM (Immunoglobulin M), Srm: 126 mg/dL (ref 15–143)
Total Protein: 7 g/dL (ref 6.0–8.5)

## 2017-03-30 LAB — GLIADIN ANTIBODIES, SERUM
Antigliadin Abs, IgA: 6 units (ref 0–19)
Gliadin IgG: 2 units (ref 0–19)

## 2017-03-30 LAB — TISSUE TRANSGLUTAMINASE, IGA: Transglutaminase IgA: <2 U/mL (ref 0–3)

## 2017-03-30 LAB — ANA W/REFLEX: ANA: NEGATIVE

## 2017-03-30 LAB — HEAVY METALS, BLOOD
Arsenic: 11 ug/L (ref 2–23)
Lead, Blood: NOT DETECTED ug/dL (ref 0–19)
MERCURY: NOT DETECTED ug/L (ref 0.0–14.9)

## 2017-03-30 LAB — PAN-ANCA
ANCA Proteinase 3: <3.5 U/mL (ref 0.0–3.5)
Atypical pANCA: <1:20 {titer}
C-ANCA: <1:20 {titer}
Myeloperoxidase Ab: <9 U/mL (ref 0.0–9.0)

## 2017-03-30 LAB — FOLATE: Folate: >20 ng/mL (ref >3.0)

## 2017-03-30 LAB — HEMOGLOBIN A1C
Est. average glucose Bld gHb Est-mCnc: 123 mg/dL
Hgb A1c MFr Bld: 5.9 % — ABNORMAL HIGH (ref 4.8–5.6)

## 2017-03-30 LAB — ANGIOTENSIN CONVERTING ENZYME: ANGIO CONVERT ENZYME: 28 U/L (ref 14–82)

## 2017-03-30 LAB — HEPATITIS C ANTIBODY: Hep C Virus Ab: <0.1 s/co ratio (ref 0.0–0.9)

## 2017-03-30 LAB — VITAMIN B1: Thiamine: 124.2 nmol/L (ref 66.5–200.0)

## 2017-03-30 LAB — RHEUMATOID FACTOR: Rhuematoid fact SerPl-aCnc: <10 IU/mL (ref 0.0–13.9)

## 2017-03-30 LAB — RPR: RPR Ser Ql: NONREACTIVE

## 2017-03-30 LAB — VITAMIN B6: VITAMIN B6: 36.1 ug/L (ref 5.3–46.7)

## 2017-03-30 LAB — SJOGREN'S SYNDROME ANTIBODS(SSA + SSB)

## 2017-03-30 LAB — B. BURGDORFI ANTIBODIES

## 2017-03-30 NOTE — Telephone Encounter (Signed)
Spoke with patient and informed him that all labs are normal so far; still awaiting on one. Advised him we will let him  know if that is abnormal; we will call him. Advised otherwise everything looks fine. Informed patient his HgbA1c is however mildly elevated, indicating pre-diabetes so he needs to evaluate his diet.  Patient stated his PCP is watching his Hgb A1C due to being mildly elevated in past. He stated he viewed his lab results in my chart. He asked if the remaining lab will be in his my chart, and this RN advised it will. He verbalized understanding, appreciation.

## 2017-04-13 ENCOUNTER — Telehealth: Payer: Self-pay | Admitting: Cardiology

## 2017-04-13 NOTE — Telephone Encounter (Signed)
Please forward to Fuller Canada, PharmD to determine which ARB will be more cost effective for him

## 2017-04-13 NOTE — Telephone Encounter (Signed)
Will route to Dr Radford Pax for recommended therapy for her pt, as advised by our Pharmacist.

## 2017-04-13 NOTE — Telephone Encounter (Signed)
Please change to Irbesartan 379m daily and have him check his BP daily for a week and call with results

## 2017-04-13 NOTE — Telephone Encounter (Signed)
New message      Pt c/o medication issue:  1. Name of Medication:  valsartan 2. How are you currently taking this medication (dosage and times per day)?  362m 3. Are you having a reaction (difficulty breathing--STAT)? no 4. What is your medication issue?  Medication is on recall.  Patient does not want new medication called in to pharmacy.  He want to use all of his medication but he would like to know what will Dr TRadford Paxcall in to replace this drug?  He want to call his ins co to get the cost.

## 2017-04-13 NOTE — Telephone Encounter (Signed)
Advised patient of change in medication. Patient does not want to start medication at this time due to still having some of the current prescription left over. I will take the valsartan off of his chart and replace it with the ibesartan 300 mg but will not send a prescription to his pharmacy at this time.

## 2017-04-13 NOTE — Telephone Encounter (Signed)
Pt on valsartan for HTN. Would recommend change to irbesartan 393m daily (this may be a higher tier on insurance but will generally control pressures better) losartan 1038mdaily is likely the same or lower tier but may not control his pressure as well.

## 2017-04-14 ENCOUNTER — Other Ambulatory Visit: Payer: Self-pay | Admitting: Cardiology

## 2017-04-14 MED ORDER — IRBESARTAN 300 MG PO TABS: 300.0000 mg | ORAL_TABLET | Freq: Every day | ORAL | 0 refills | Status: DC

## 2017-04-23 ENCOUNTER — Other Ambulatory Visit: Payer: Self-pay | Admitting: Cardiology

## 2017-05-16 ENCOUNTER — Ambulatory Visit (INDEPENDENT_AMBULATORY_CARE_PROVIDER_SITE_OTHER): Payer: 59 | Admitting: Cardiology

## 2017-05-16 ENCOUNTER — Encounter: Payer: Self-pay | Admitting: Cardiology

## 2017-05-16 VITALS — BP 128/70 | HR 60 | Ht 75.0 in | Wt 293.0 lb

## 2017-05-16 DIAGNOSIS — E7800 Pure hypercholesterolemia, unspecified: Secondary | ICD-10-CM

## 2017-05-16 DIAGNOSIS — I2510 Atherosclerotic heart disease of native coronary artery without angina pectoris: Secondary | ICD-10-CM

## 2017-05-16 DIAGNOSIS — I10 Essential (primary) hypertension: Secondary | ICD-10-CM

## 2017-05-16 DIAGNOSIS — R200 Anesthesia of skin: Secondary | ICD-10-CM

## 2017-05-16 NOTE — Patient Instructions (Signed)
Medication Instructions:  Your provider recommends that you continue on your current medications as directed. Please refer to the Current Medication list given to you today.    Labwork: None  Testing/Procedures: None  Follow-Up: Your provider wants you to follow-up in: 1 year with Dr. Radford Pax. You will receive a reminder letter in the mail two months in advance. If you don't receive a letter, please call our office to schedule the follow-up appointment.    Any Other Special Instructions Will Be Listed Below (If Applicable).     If you need a refill on your cardiac medications before your next appointment, please call your pharmacy.

## 2017-05-16 NOTE — Progress Notes (Signed)
Cardiology Office Note:    Date:  05/16/2017   ID:  Marcus [name], DOB July 28, 1941, MRN [id number]  PCP:  Hulan Fess, MD  Cardiologist:  Fransico Him, MD   Referring MD: Hulan Fess, MD   Chief Complaint  Patient presents with  . Coronary Artery Disease  . Hypertension  . Hyperlipidemia    History of Present Illness:    Marcus [name] is a 76 y.o. male with a history of ASCAD, HTN and dyslipidemia.He has a history of elevated ALT and was evaluated by GI and US showed an enlarged liver. He stopped ETOH and tylenol and hisALT normalized. He is here today for followup and is doing well. He denies any anginal chest pain or pressure, SOB, DOE (except with extreme exertion),PND, orthopnea,  dizziness, palpitations or syncope. He has chronic LE edema which is stable and unchanged from last OV. At last OV he had been having some numbness of his legs and feet but no pain with ambulation.  His LE arterial dopplers showed no 30-50% left common femoral artery stenosis.  He says that he snores according to his wife but is not interested in a sleep study.   Past Medical History:  Diagnosis Date  . Benign essential HTN 09/09/2013  . Coronary artery disease 08/2013   S/P PCI of the LAD  . Dyslipidemia   . Elevated fasting glucose   . Fatty liver   . Glaucoma   . Gout   . Hyperlipidemia   . Hypertension   . Joint pain   . LAD (lymphadenopathy)   . Obesity   . Pure hypercholesterolemia 09/09/2013  . Vision abnormalities     Past Surgical History:  Procedure Laterality Date  . ADENOIDECTOMY    . CATARACT EXTRACTION    . CORONARY ANGIOPLASTY WITH STENT PLACEMENT  2009  . EYE SURGERY    . TONSILLECTOMY    . WISDOM TOOTH EXTRACTION      Current Medications: Current Meds  Medication Sig  . allopurinol (ZYLOPRIM) 300 MG tablet Take 300 mg by mouth daily.  Marland Kitchen aspirin 81 MG tablet Take 81 mg by mouth daily.  Marland Kitchen atorvastatin (LIPITOR) 80 MG tablet TAKE 1 TABLET BY MOUTH  DAILY    . DOCOSAHEXAENOIC ACID PO 3 g.  . dorzolamide-timolol (COSOPT) 22.3-6.8 MG/ML ophthalmic solution Place 1 drop into both eyes 2 (two) times daily.  . fenofibrate (TRICOR) 48 MG tablet Take 1 tablet (48 mg total) by mouth daily.  Marland Kitchen latanoprost (XALATAN) 0.005 % ophthalmic solution Place 1 drop into both eyes daily.  . metoprolol succinate (TOPROL-XL) 100 MG 24 hr tablet TAKE 1 TABLET DAILY WITH OR IMMEDIATELY FOLLOWING A  MEAL  . minocycline (MINOCIN,DYNACIN) 100 MG capsule Take 100 mg by mouth as needed. For skin acne  . nitroGLYCERIN (NITROSTAT) 0.4 MG SL tablet Place 0.4 mg under the tongue every 5 (five) minutes as needed for chest pain.  . Omega-3 Fatty Acids (FISH OIL) 1000 MG CAPS 2 capsules (2037m) by mouth two times a day  . VALSARTAN PO Take by mouth daily.     Allergies:   Patient has no known allergies.   Social History   Social History  . Marital status: Married    Spouse name: N/A  . Number of children: N/A  . Years of education: N/A   Social History Main Topics  . Smoking status: Current Every Day Smoker    Types: Cigars  . Smokeless tobacco: Never Used     Comment: daily  .  Alcohol use 0.6 oz/week    1 Cans of beer per week     Comment: 2-3 beers daily  . Drug use: No  . Sexual activity: Not Asked   Other Topics Concern  . None   Social History Narrative  . None     Family History: The patient's family history includes Breast cancer in his mother; CAD in his brother and sister; Heart attack in his father; Multiple sclerosis in his sister; Rheumatic fever in his father. There is no history of Neuropathy.  ROS:   Please see the history of present illness.     All other systems reviewed and are negative.  EKGs/Labs/Other Studies Reviewed:    The following studies were reviewed today: LE arterial dopplers 05/2016  EKG:  EKG is ordered today and showed NSR at 60bpm with no ST changes  Recent Labs: No results found for requested labs within last 8760  hours.   Recent Lipid Panel    Component Value Date/Time   CHOL 104 (L) 04/18/2016 0922   CHOL 105 08/01/2014 0835   TRIG 207 (H) 04/18/2016 0922   TRIG 182 (H) 08/01/2014 0835   HDL 28 (L) 04/18/2016 0922   HDL 26 (L) 08/01/2014 0835   CHOLHDL 3.7 04/18/2016 0922   VLDL 41 (H) 04/18/2016 0922   LDLCALC 35 04/18/2016 0922   LDLCALC 43 08/01/2014 0835    Physical Exam:    VS:  BP 128/70   Pulse 60   Ht _0  (1.905 m)   Wt 293 lb (132.9 kg)   BMI 36.62 kg/m     Wt Readings from Last 3 Encounters:  05/16/17 293 lb (132.9 kg)  03/23/17 292 lb 9.6 oz (132.7 kg)  05/02/16 294 lb 12.8 oz (133.7 kg)     GEN:  Well nourished, well developed in no acute distress HEENT: Normal NECK: No JVD; No carotid bruits LYMPHATICS: No lymphadenopathy CARDIAC: RRR, no murmurs, rubs, gallops RESPIRATORY:  Clear to auscultation without rales, wheezing or rhonchi  ABDOMEN: Soft, non-tender, non-distended MUSCULOSKELETAL:  No edema; No deformity  SKIN: Warm and dry NEUROLOGIC:  Alert and oriented x 3 PSYCHIATRIC:  Normal affect   ASSESSMENT:    1. Atherosclerosis of native coronary artery of native heart without angina pectoris   2. Benign essential HTN   3. Pure hypercholesterolemia   4. Leg numbness    PLAN:    In order of problems listed above:  1. ASCAD s/p PCI of the LAD.  He has no anginal symptoms.  He will continue ASA/statin/BB. 2. HTN - BP is controlled on .  He will continue on ARB, Toprol XL 187m daily. 3. Dyslipidemia - LDL goal < 70.  Continue fenofibrate 462mdaily and atorvastatin 9013maily. LDL at goal (35) by lipids 04/2016.  I will get an FLP and ALT next month.  4. LE numbness with no pain with ambulating - LE dopplers showed 40-50% left common femoral stenosis.  He will continue on statin. He was seen by Neuro and dx with LE neuropathy.     Medication Adjustments/Labs and Tests Ordered: Current medicines are reviewed at length with the patient today.   Concerns regarding medicines are outlined above.  No orders of the defined types were placed in this encounter.  No orders of the defined types were placed in this encounter.   Signed, TraFransico HimD  05/16/2017 1:27 PM    [facility]

## 2017-06-07 ENCOUNTER — Other Ambulatory Visit: Payer: Self-pay | Admitting: Cardiology

## 2017-06-08 ENCOUNTER — Other Ambulatory Visit: Payer: 59 | Admitting: *Deleted

## 2017-06-08 DIAGNOSIS — E785 Hyperlipidemia, unspecified: Secondary | ICD-10-CM

## 2017-06-08 LAB — LIPID PANEL
Chol/HDL Ratio: 3.3 ratio (ref 0.0–5.0)
Cholesterol, Total: 109 mg/dL (ref 100–199)
HDL: 33 mg/dL — ABNORMAL LOW (ref >39)
LDL Calculated: 41 mg/dL (ref 0–99)
Triglycerides: 175 mg/dL — ABNORMAL HIGH (ref 0–149)
VLDL CHOLESTEROL CAL: 35 mg/dL (ref 5–40)

## 2017-06-13 ENCOUNTER — Other Ambulatory Visit: Payer: 59

## 2017-06-21 ENCOUNTER — Telehealth: Payer: Self-pay | Admitting: *Deleted

## 2017-06-21 ENCOUNTER — Other Ambulatory Visit: Payer: Self-pay | Admitting: Cardiology

## 2017-06-21 NOTE — Telephone Encounter (Signed)
   [facility] Pre-operative Risk Assessment    Request for surgical clearance:  1. What type of surgery is being performed? Ptosis repair/Brow lift  2. When is this surgery scheduled? 11.01.2018  3. Are there any medications that need to be held prior to surgery and how long? :Medical Clearance: Asprin for 1 week  4. Practice name and name of physician performing surgery? [facility].Department of Ophthalmology N/A.   5. What is your office phone and fax number? Ph: [phone] Fx: [phone]  8. Anesthesia type (None, local, MAC, general) ? N/A   Marcus [name] 06/21/2017, 2:58 PM  _________________________________________________________________   (provider comments below)

## 2017-06-22 NOTE — Telephone Encounter (Signed)
   Chart reviewed as part of pre-operative protocol coverage. Given past medical history and time since last visit, based on ACC/AHA guidelines, [name] [name] would be at acceptable risk for the planned procedure without further cardiovascular testing.   Erma Heritage, PA-C 06/22/2017, 3:30 PM

## 2017-06-29 ENCOUNTER — Telehealth: Payer: Self-pay | Admitting: Cardiology

## 2017-06-29 NOTE — Telephone Encounter (Signed)
Cardiac clearance printed and faxed to the fax # provided

## 2017-06-29 NOTE — Telephone Encounter (Signed)
yes 

## 2017-06-29 NOTE — Telephone Encounter (Signed)
Addendum to original clearance request and routed to pre op pool for review/recommendation.

## 2017-06-29 NOTE — Telephone Encounter (Signed)
Please let surgeon know that [facility] to hold ASA for one week per Dr. Radford Pax.

## 2017-06-29 NOTE — Telephone Encounter (Signed)
Received call from opthalmology PA at [facility] requesting specifically documetation stating it is ok for patient to hold aspirin for 1 week prior.  Will route to PreOp Pool.

## 2017-06-29 NOTE — Telephone Encounter (Signed)
New message    Davy Pique PA is calling asking about clearance. She said she got reply back about clearance but not about aspirin.

## 2017-06-29 NOTE — Telephone Encounter (Signed)
Please advice  

## 2017-07-25 ENCOUNTER — Other Ambulatory Visit: Payer: Self-pay | Admitting: Cardiology

## 2017-07-25 DIAGNOSIS — E785 Hyperlipidemia, unspecified: Secondary | ICD-10-CM

## 2018-02-19 ENCOUNTER — Encounter: Payer: Self-pay | Admitting: Cardiology

## 2018-05-23 ENCOUNTER — Other Ambulatory Visit: Payer: Self-pay | Admitting: Cardiology

## 2018-05-23 MED ORDER — IRBESARTAN 300 MG PO TABS: 300.0000 mg | ORAL_TABLET | Freq: Every day | ORAL | 0 refills | Status: DC

## 2018-06-12 ENCOUNTER — Encounter: Payer: Self-pay | Admitting: Cardiology

## 2018-06-12 ENCOUNTER — Ambulatory Visit: Payer: 59 | Admitting: Cardiology

## 2018-06-12 VITALS — BP 132/68 | HR 59 | Ht 75.0 in | Wt 284.2 lb

## 2018-06-12 DIAGNOSIS — I10 Essential (primary) hypertension: Secondary | ICD-10-CM

## 2018-06-12 DIAGNOSIS — I739 Peripheral vascular disease, unspecified: Secondary | ICD-10-CM | POA: Diagnosis not present

## 2018-06-12 DIAGNOSIS — I2510 Atherosclerotic heart disease of native coronary artery without angina pectoris: Secondary | ICD-10-CM | POA: Diagnosis not present

## 2018-06-12 DIAGNOSIS — E7800 Pure hypercholesterolemia, unspecified: Secondary | ICD-10-CM

## 2018-06-12 HISTORY — DX: Peripheral vascular disease, unspecified: I73.9

## 2018-06-12 LAB — BASIC METABOLIC PANEL
BUN / CREAT RATIO: 16 (ref 10–24)
BUN: 21 mg/dL (ref 8–27)
CHLORIDE: 101 mmol/L (ref 96–106)
CO2: 24 mmol/L (ref 20–29)
CREATININE: 1.35 mg/dL — AB (ref 0.76–1.27)
Calcium: 9.6 mg/dL (ref 8.6–10.2)
GFR calc non Af Amer: 51 mL/min/{1.73_m2} — ABNORMAL LOW (ref >59)
GFR, EST AFRICAN AMERICAN: 59 mL/min/{1.73_m2} — AB (ref >59)
Glucose: 113 mg/dL — ABNORMAL HIGH (ref 65–99)
Potassium: 5 mmol/L (ref 3.5–5.2)
SODIUM: 141 mmol/L (ref 134–144)

## 2018-06-12 NOTE — Progress Notes (Signed)
Cardiology Office Note:    Date:  06/12/2018   ID:  Marcus [name], DOB 1941-09-09, MRN [id number]  PCP:  Hulan Fess, MD He Cardiologist:  Fransico Him, MD    Referring MD: Hulan Fess, MD   Chief Complaint  Patient presents with  . Coronary Artery Disease  . Hypertension  . Hyperlipidemia    History of Present Illness:    Marcus [name] is a 77 y.o. male with a hx of ASCAD, HTN and dyslipidemia.He has a history of elevated ALT and was evaluated by GI and US showed an enlarged liver. He stopped ETOH and tylenol and hisALT normalized.  LE arterial dopplers 20017 showed 30-49% left common femoral stenosis.  He is here today for followup and is doing well.  He denies any chest pain or pressure, SOB, DOE, PND, orthopnea, LE edema, dizziness, palpitations or syncope. He is compliant with his meds and is tolerating meds with no SE.   Past Medical History:  Diagnosis Date  . Benign essential HTN 09/09/2013  . Coronary artery disease 08/2013   S/P PCI of the LAD  . Dyslipidemia   . Elevated fasting glucose   . Fatty liver   . Glaucoma   . Gout   . Hyperlipidemia   . Hypertension   . Joint pain   . LAD (lymphadenopathy)   . Obesity   . Pure hypercholesterolemia 09/09/2013  . Vision abnormalities     Past Surgical History:  Procedure Laterality Date  . ADENOIDECTOMY    . CATARACT EXTRACTION    . CORONARY ANGIOPLASTY WITH STENT PLACEMENT  2009  . EYE SURGERY    . TONSILLECTOMY    . WISDOM TOOTH EXTRACTION      Current Medications: Current Meds  Medication Sig  . allopurinol (ZYLOPRIM) 300 MG tablet Take 300 mg by mouth daily.  Marland Kitchen aspirin 81 MG tablet Take 81 mg by mouth daily.  Marland Kitchen atorvastatin (LIPITOR) 80 MG tablet Take 1 tablet (80 mg total) by mouth daily.  . dorzolamide-timolol (COSOPT) 22.3-6.8 MG/ML ophthalmic solution Place 1 drop into both eyes 2 (two) times daily.  . irbesartan (AVAPRO) 300 MG tablet Take 1 tablet (300 mg total) by mouth daily. Please keep  upcoming appt in October with Dr. Radford Pax for future refills. Thank you  . latanoprost (XALATAN) 0.005 % ophthalmic solution Place 1 drop into both eyes daily.  . metoprolol succinate (TOPROL-XL) 100 MG 24 hr tablet Take 1 tablet (100 mg total) by mouth daily. WITH OR IMMEDIATELY FOLLOWING A  MEAL  . minocycline (MINOCIN,DYNACIN) 100 MG capsule Take 100 mg by mouth as needed. For skin acne  . nitroGLYCERIN (NITROSTAT) 0.4 MG SL tablet Place 0.4 mg under the tongue every 5 (five) minutes as needed for chest pain.  . Omega-3 Fatty Acids (FISH OIL) 1000 MG CAPS 2 capsules (207m) by mouth two times a day     Allergies:   Patient has no known allergies.   Social History   Socioeconomic History  . Marital status: Married    Spouse name: Not on file  . Number of children: Not on file  . Years of education: Not on file  . Highest education level: Not on file  Occupational History  . Not on file  Social Needs  . Financial resource strain: Not on file  . Food insecurity:    Worry: Not on file    Inability: Not on file  . Transportation needs:    Medical: Not on file    Non-medical:  Not on file  Tobacco Use  . Smoking status: Current Every Day Smoker    Types: Cigars  . Smokeless tobacco: Never Used  . Tobacco comment: daily  Substance and Sexual Activity  . Alcohol use: Yes    Alcohol/week: 1.0 standard drinks    Types: 1 Cans of beer per week    Comment: 2-3 beers daily  . Drug use: No  . Sexual activity: Not on file  Lifestyle  . Physical activity:    Days per week: Not on file    Minutes per session: Not on file  . Stress: Not on file  Relationships  . Social connections:    Talks on phone: Not on file    Gets together: Not on file    Attends religious service: Not on file    Active member of club or organization: Not on file    Attends meetings of clubs or organizations: Not on file    Relationship status: Not on file  Other Topics Concern  . Not on file  Social  History Narrative  . Not on file     Family History: The patient's family history includes Breast cancer in his mother; CAD in his brother and sister; Heart attack in his father; Multiple sclerosis in his sister; Rheumatic fever in his father. There is no history of Neuropathy.  ROS:   Please see the history of present illness.    ROS  All other systems reviewed and negative.   EKGs/Labs/Other Studies Reviewed:    The following studies were reviewed today: none  EKG:  EKG is  ordered today.  The ekg ordered today demonstrates sinus bradycardia 59 bpm with no ST-T wave changes.  Recent Labs: No results found for requested labs within last 8760 hours.   Recent Lipid Panel    Component Value Date/Time   CHOL 109 06/08/2017 0934   CHOL 105 08/01/2014 0835   TRIG 175 (H) 06/08/2017 0934   TRIG 182 (H) 08/01/2014 0835   HDL 33 (L) 06/08/2017 0934   HDL 26 (L) 08/01/2014 0835   CHOLHDL 3.3 06/08/2017 0934   CHOLHDL 3.7 04/18/2016 0922   VLDL 41 (H) 04/18/2016 0922   LDLCALC 41 06/08/2017 0934   LDLCALC 43 08/01/2014 0835    Physical Exam:    VS:  BP 132/68   Pulse (!) 59   Ht _0  (1.905 m)   Wt 284 lb 3.2 oz (128.9 kg)   BMI 35.52 kg/m     Wt Readings from Last 3 Encounters:  06/12/18 284 lb 3.2 oz (128.9 kg)  05/16/17 293 lb (132.9 kg)  03/23/17 292 lb 9.6 oz (132.7 kg)     GEN:  Well nourished, well developed in no acute distress HEENT: Normal NECK: No JVD; No carotid bruits LYMPHATICS: No lymphadenopathy CARDIAC: RRR, no murmurs, rubs, gallops RESPIRATORY:  Clear to auscultation without rales, wheezing or rhonchi  ABDOMEN: Soft, non-tender, non-distended MUSCULOSKELETAL:  No edema; No deformity  SKIN: Warm and dry NEUROLOGIC:  Alert and oriented x 3 PSYCHIATRIC:  Normal affect   ASSESSMENT:    1. Coronary artery disease involving native coronary artery of native heart without angina pectoris   2. Benign essential HTN   3. Pure hypercholesterolemia      PLAN:    In order of problems listed above:  1.  ASCAD - s/p PCI of the LAD in 2014.  He denies any anginal sx. he will continue on aspirin 81 mg daily, beta-blocker and statin.  2.  HTN -BP is well controlled on exam today.  He will continue on Toprol-XL 100 mg daily and irbesartan 300 mg daily.  Creatinine stable at 1.45 and potassium 5 on 02/21/2018.  I will repeat a bmet today.  3.  Hyperlipidemia -LDL goal less than 70.  His LDL was 46 on 02/21/2018 with ALT 40.  He will continue on atorvastatin 80 mg daily.  4.  PVD - dopplers in 2017 showed 30-49% left common femoral stenosis.  Denies any claudication symptoms, numbness or tingling or weakness.   Medication Adjustments/Labs and Tests Ordered: Current medicines are reviewed at length with the patient today.  Concerns regarding medicines are outlined above.  Orders Placed This Encounter  Procedures  . EKG 12-Lead   No orders of the defined types were placed in this encounter.   Signed, Fransico Him, MD  06/12/2018 8:10 AM    [place]

## 2018-06-12 NOTE — Patient Instructions (Signed)
Medication Instructions:  Your physician recommends that you continue on your current medications as directed. Please refer to the Current Medication list given to you today.  Labwork: Today: BMET  Follow-Up: Your physician wants you to follow-up in: 1 year with Dr. Radford Pax. You will receive a reminder letter in the mail two months in advance. If you don't receive a letter, please call our office to schedule the follow-up appointment.  If you need a refill on your cardiac medications before your next appointment, please call your pharmacy.

## 2018-06-30 ENCOUNTER — Other Ambulatory Visit: Payer: Self-pay | Admitting: Cardiology

## 2018-07-10 ENCOUNTER — Other Ambulatory Visit: Payer: Self-pay | Admitting: Cardiology

## 2018-09-18 ENCOUNTER — Telehealth: Payer: Self-pay

## 2018-09-18 NOTE — Telephone Encounter (Signed)
Pt called in stating that [facility] is currently out of stock of the irbesartan 350m and do not know when they will get the medication back in stock. Pt needs a different prescription. He would like to be contacted on [phone]. Please address. Thank you.

## 2018-09-18 NOTE — Telephone Encounter (Signed)
Please see if his insurance will cover Losartan 161m daily

## 2018-09-19 MED ORDER — LOSARTAN POTASSIUM 100 MG PO TABS: 100.0000 mg | ORAL_TABLET | Freq: Every day | ORAL | 3 refills | Status: DC

## 2018-09-19 NOTE — Telephone Encounter (Signed)
Spoke with the patient, he accepted taking Losartan 100 mg and stopped taking Irbesartan. Medications sent to pharmacy.

## 2019-05-01 ENCOUNTER — Other Ambulatory Visit: Payer: Self-pay

## 2019-05-01 ENCOUNTER — Telehealth: Payer: Self-pay | Admitting: Cardiology

## 2019-05-01 NOTE — Telephone Encounter (Signed)
New message   Patient would like a call back to discuss new insurance and renewal of medication. Please call.

## 2019-05-01 NOTE — Telephone Encounter (Signed)
Spoke with the pt and he is changing his Insurance from [facility] to [employer]... he says he will need to call us in 05/2019 when he is ready for [place] to send his RX to the new mail order RX.. he is asking me which mail order RX he will be using and I advised him to check with his Insurance [place] which they prefer he uses which may be [facility] but he needs to be sure that will be his new one when his new Ins kicks in.

## 2019-05-02 ENCOUNTER — Other Ambulatory Visit: Payer: Self-pay | Admitting: Cardiology

## 2019-06-06 DIAGNOSIS — R69 Illness, unspecified: Secondary | ICD-10-CM | POA: Diagnosis not present

## 2019-06-06 DIAGNOSIS — H401113 Primary open-angle glaucoma, right eye, severe stage: Secondary | ICD-10-CM | POA: Diagnosis not present

## 2019-06-06 DIAGNOSIS — H401123 Primary open-angle glaucoma, left eye, severe stage: Secondary | ICD-10-CM | POA: Diagnosis not present

## 2019-06-18 ENCOUNTER — Ambulatory Visit: Payer: Medicare HMO | Admitting: Cardiology

## 2019-06-18 ENCOUNTER — Encounter: Payer: Self-pay | Admitting: Cardiology

## 2019-06-18 ENCOUNTER — Other Ambulatory Visit: Payer: Self-pay

## 2019-06-18 VITALS — BP 134/66 | HR 62 | Ht 75.0 in | Wt 290.0 lb

## 2019-06-18 DIAGNOSIS — I2510 Atherosclerotic heart disease of native coronary artery without angina pectoris: Secondary | ICD-10-CM

## 2019-06-18 DIAGNOSIS — I10 Essential (primary) hypertension: Secondary | ICD-10-CM | POA: Diagnosis not present

## 2019-06-18 DIAGNOSIS — I739 Peripheral vascular disease, unspecified: Secondary | ICD-10-CM

## 2019-06-18 DIAGNOSIS — E7800 Pure hypercholesterolemia, unspecified: Secondary | ICD-10-CM | POA: Diagnosis not present

## 2019-06-18 MED ORDER — METOPROLOL SUCCINATE ER 100 MG PO TB24: ORAL_TABLET | ORAL | 0 refills | Status: DC

## 2019-06-18 MED ORDER — LOSARTAN POTASSIUM 100 MG PO TABS: 100.0000 mg | ORAL_TABLET | Freq: Every day | ORAL | 3 refills | Status: DC

## 2019-06-18 MED ORDER — ATORVASTATIN CALCIUM 80 MG PO TABS: 80.0000 mg | ORAL_TABLET | Freq: Every day | ORAL | 0 refills | Status: DC

## 2019-06-18 NOTE — Patient Instructions (Signed)
Medication Instructions:   If you need a refill on your cardiac medications before your next appointment, please call your pharmacy.   Lab work:  If you have labs (blood work) drawn today and your tests are completely normal, you will receive your results only by: Marland Kitchen MyChart Message (if you have MyChart) OR . A paper copy in the mail If you have any lab test that is abnormal or we need to change your treatment, we will call you to review the results.  Testing/Procedures: None ordered today.  Follow-Up: At [facility], you and your health needs are our priority.  As part of our continuing mission to provide you with exceptional heart care, we have created designated Provider Care Teams.  These Care Teams include your primary Cardiologist (physician) and Advanced Practice Providers (APPs -  Physician Assistants and Nurse Practitioners) who all work together to provide you with the care you need, when you need it. You will need a follow up appointment in 12 months.  Please call our office 2 months in advance to schedule this appointment.  You may see Fransico Him, MD or one of the following Advanced Practice Providers on your designated Care Team:   [place], PA-C Melina Copa, PA-C . Ermalinda Barrios, PA-C

## 2019-06-18 NOTE — Progress Notes (Signed)
Cardiology Office Note:    Date:  06/18/2019   ID:  Marcus [name], DOB 16-Apr-1941, MRN [id number]  PCP:  Hulan Fess, MD  Cardiologist:  Fransico Him, MD    Referring MD: Hulan Fess, MD   Chief Complaint  Patient presents with  . Coronary Artery Disease  . Hypertension  . Hyperlipidemia    History of Present Illness:    Marcus [name] is a 78 y.o. male with a hx of  ASCAD, HTN and dyslipidemia.He has a history of elevated ALT and was evaluated by GI and US showed an enlarged liver. He stopped ETOH and tylenol and hisALT normalized.  LE arterial dopplers 20017 showed 30-49% left common femoral stenosis.    He is here today for followup and is doing well.  He denies any chest pain or pressure, SOB, DOE, PND, orthopnea, LE edema, dizziness, palpitations or syncope. He is compliant with his meds and is tolerating meds with no SE.    Past Medical History:  Diagnosis Date  . Benign essential HTN 09/09/2013  . Coronary artery disease 08/2013   S/P PCI of the LAD  . Dyslipidemia   . Elevated fasting glucose   . Fatty liver   . Glaucoma   . Gout   . Hyperlipidemia   . Hypertension   . Joint pain   . LAD (lymphadenopathy)   . Obesity   . Pure hypercholesterolemia 09/09/2013  . PVD (peripheral vascular disease) ([place]) 06/12/2018    30-49% left common femoral stenosis  . Vision abnormalities     Past Surgical History:  Procedure Laterality Date  . ADENOIDECTOMY    . CATARACT EXTRACTION    . CORONARY ANGIOPLASTY WITH STENT PLACEMENT  2009  . EYE SURGERY    . TONSILLECTOMY    . WISDOM TOOTH EXTRACTION      Current Medications: No outpatient medications have been marked as taking for the 06/18/19 encounter (Office Visit) with Sueanne Margarita, MD.     Allergies:   Patient has no known allergies.   Social History   Socioeconomic History  . Marital status: Married    Spouse name: Not on file  . Number of children: Not on file  . Years of education: Not on file   . Highest education level: Not on file  Occupational History  . Not on file  Social Needs  . Financial resource strain: Not on file  . Food insecurity    Worry: Not on file    Inability: Not on file  . Transportation needs    Medical: Not on file    Non-medical: Not on file  Tobacco Use  . Smoking status: Current Every Day Smoker    Types: Cigars  . Smokeless tobacco: Never Used  . Tobacco comment: daily  Substance and Sexual Activity  . Alcohol use: Yes    Alcohol/week: 1.0 standard drinks    Types: 1 Cans of beer per week    Comment: 2-3 beers daily  . Drug use: No  . Sexual activity: Not on file  Lifestyle  . Physical activity    Days per week: Not on file    Minutes per session: Not on file  . Stress: Not on file  Relationships  . Social Herbalist on phone: Not on file    Gets together: Not on file    Attends religious service: Not on file    Active member of club or organization: Not on file    Attends meetings  of clubs or organizations: Not on file    Relationship status: Not on file  Other Topics Concern  . Not on file  Social History Narrative  . Not on file     Family History: The patient's family history includes Breast cancer in his mother; CAD in his brother and sister; Heart attack in his father; Multiple sclerosis in his sister; Rheumatic fever in his father. There is no history of Neuropathy.  ROS:   Please see the history of present illness.    ROS  All other systems reviewed and negative.   EKGs/Labs/Other Studies Reviewed:    The following studies were reviewed today: none  EKG:  EKG is  ordered today.  The ekg ordered today demonstrates NSR with no ST changes and normal intervals  Recent Labs: No results found for requested labs within last 8760 hours.   Recent Lipid Panel    Component Value Date/Time   CHOL 109 06/08/2017 0934   CHOL 105 08/01/2014 0835   TRIG 175 (H) 06/08/2017 0934   TRIG 182 (H) 08/01/2014 0835    HDL 33 (L) 06/08/2017 0934   HDL 26 (L) 08/01/2014 0835   CHOLHDL 3.3 06/08/2017 0934   CHOLHDL 3.7 04/18/2016 0922   VLDL 41 (H) 04/18/2016 0922   LDLCALC 41 06/08/2017 0934   LDLCALC 43 08/01/2014 0835    Physical Exam:    VS:  There were no vitals taken for this visit.    Wt Readings from Last 3 Encounters:  06/12/18 284 lb 3.2 oz (128.9 kg)  05/16/17 293 lb (132.9 kg)  03/23/17 292 lb 9.6 oz (132.7 kg)     GEN:  Well nourished, well developed in no acute distress HEENT: Normal NECK: No JVD; No carotid bruits LYMPHATICS: No lymphadenopathy CARDIAC: RRR, no murmurs, rubs, gallops RESPIRATORY:  Clear to auscultation without rales, wheezing or rhonchi  ABDOMEN: Soft, non-tender, non-distended MUSCULOSKELETAL:  No edema; No deformity  SKIN: Warm and dry NEUROLOGIC:  Alert and oriented x 3 PSYCHIATRIC:  Normal affect   ASSESSMENT:    1. Coronary artery disease involving native coronary artery of native heart without angina pectoris   2. Benign essential HTN   3. Pure hypercholesterolemia   4. PVD (peripheral vascular disease) ([place])    PLAN:    In order of problems listed above:  1.  ASCAD -s/p remote PCI of LAD -continue ASA 95m daily, BB and statin  2.  HTN -BP controlled on exam -cotiinue Losartan 1024mdaily and Toprol XL 10057maily -creatinine stable at 1.5 on 02/2019  3.  HLD -LDL goal < 70 -LDL 40 on 02/2019 -continue atorvastatin 37m23mily  4.  PVD -LE arterial dopplers 20017 showed 30-49% left common femoral stenosis.   -he has no claudication sx -encouraged walking program  Medication Adjustments/Labs and Tests Ordered: Current medicines are reviewed at length with the patient today.  Concerns regarding medicines are outlined above.  No orders of the defined types were placed in this encounter.  No orders of the defined types were placed in this encounter.   Signed, TracFransico Him  06/18/2019 7:44 AM    [facility]

## 2019-07-10 ENCOUNTER — Other Ambulatory Visit: Payer: Self-pay | Admitting: Cardiology

## 2019-07-20 ENCOUNTER — Other Ambulatory Visit: Payer: Self-pay | Admitting: Cardiology

## 2019-08-12 DIAGNOSIS — H401123 Primary open-angle glaucoma, left eye, severe stage: Secondary | ICD-10-CM | POA: Diagnosis not present

## 2019-08-12 DIAGNOSIS — H401113 Primary open-angle glaucoma, right eye, severe stage: Secondary | ICD-10-CM | POA: Diagnosis not present

## 2019-09-20 DIAGNOSIS — H401123 Primary open-angle glaucoma, left eye, severe stage: Secondary | ICD-10-CM | POA: Diagnosis not present

## 2019-09-23 DIAGNOSIS — H401113 Primary open-angle glaucoma, right eye, severe stage: Secondary | ICD-10-CM | POA: Diagnosis not present

## 2019-09-23 DIAGNOSIS — H401123 Primary open-angle glaucoma, left eye, severe stage: Secondary | ICD-10-CM | POA: Diagnosis not present

## 2019-10-17 ENCOUNTER — Other Ambulatory Visit: Payer: Self-pay | Admitting: Cardiology

## 2020-01-02 DIAGNOSIS — H401123 Primary open-angle glaucoma, left eye, severe stage: Secondary | ICD-10-CM | POA: Diagnosis not present

## 2020-01-02 DIAGNOSIS — H401113 Primary open-angle glaucoma, right eye, severe stage: Secondary | ICD-10-CM | POA: Diagnosis not present

## 2020-01-28 ENCOUNTER — Ambulatory Visit
Admission: RE | Admit: 2020-01-28 | Discharge: 2020-01-28 | Disposition: A | Discharge status: Home / Self Care | Payer: Medicare HMO | Source: Ambulatory Visit | Attending: Cardiology | Admitting: Cardiology

## 2020-01-28 ENCOUNTER — Other Ambulatory Visit: Payer: Self-pay

## 2020-01-28 ENCOUNTER — Ambulatory Visit: Payer: Medicare HMO | Admitting: Cardiology

## 2020-01-28 ENCOUNTER — Encounter: Payer: Self-pay | Admitting: Cardiology

## 2020-01-28 VITALS — BP 138/68 | HR 55 | Ht 75.0 in | Wt 291.8 lb

## 2020-01-28 DIAGNOSIS — I10 Essential (primary) hypertension: Secondary | ICD-10-CM

## 2020-01-28 DIAGNOSIS — R0609 Other forms of dyspnea: Secondary | ICD-10-CM

## 2020-01-28 DIAGNOSIS — R0600 Dyspnea, unspecified: Secondary | ICD-10-CM | POA: Diagnosis not present

## 2020-01-28 DIAGNOSIS — I2510 Atherosclerotic heart disease of native coronary artery without angina pectoris: Secondary | ICD-10-CM

## 2020-01-28 DIAGNOSIS — E7800 Pure hypercholesterolemia, unspecified: Secondary | ICD-10-CM | POA: Diagnosis not present

## 2020-01-28 DIAGNOSIS — I739 Peripheral vascular disease, unspecified: Secondary | ICD-10-CM

## 2020-01-28 LAB — D-DIMER, QUANTITATIVE: D-DIMER: 0.32 mg/L FEU (ref 0.00–0.49)

## 2020-01-28 MED ORDER — ATORVASTATIN CALCIUM 80 MG PO TABS: 80.0000 mg | ORAL_TABLET | Freq: Every day | ORAL | 3 refills | Status: DC

## 2020-01-28 MED ORDER — LOSARTAN POTASSIUM 100 MG PO TABS: 100.0000 mg | ORAL_TABLET | Freq: Every day | ORAL | 3 refills | Status: DC

## 2020-01-28 MED ORDER — METOPROLOL SUCCINATE ER 100 MG PO TB24: ORAL_TABLET | ORAL | 3 refills | Status: DC

## 2020-01-28 NOTE — Patient Instructions (Signed)
Medication Instructions:  Your physician recommends that you continue on your current medications as directed. Please refer to the Current Medication list given to you today.  *If you need a refill on your cardiac medications before your next appointment, please call your pharmacy*  Lab Work: TODAY: BNP, BMET, CBC, TSH, D-dimer If you have labs (blood work) drawn today and your tests are completely normal, you will receive your results only by: Marland Kitchen MyChart Message (if you have MyChart) OR . A paper copy in the mail If you have any lab test that is abnormal or we need to change your treatment, we will call you to review the results.   Testing/Procedures: Your physician has requested that you have a lexiscan myoview. For further information please visit [email or web address]. Please follow instruction sheet, as given.  Your physician has requested that you have an echocardiogram. Echocardiography is a painless test that uses sound waves to create images of your heart. It provides your doctor with information about the size and shape of your heart and how well your heart's chambers and valves are working. This procedure takes approximately one hour. There are no restrictions for this procedure.  A chest x-ray takes a picture of the organs and structures inside the chest, including the heart, lungs, and blood vessels. This test can show several things, including, whether the heart is enlarges; whether fluid is building up in the lungs; and whether pacemaker / defibrillator leads are still in place.  Follow-Up: At [facility], you and your health needs are our priority.  As part of our continuing mission to provide you with exceptional heart care, we have created designated Provider Care Teams.  These Care Teams include your primary Cardiologist (physician) and Advanced Practice Providers (APPs -  Physician Assistants and Nurse Practitioners) who all work together to provide you with the care you  need, when you need it.  Your next appointment:   6 month(s)  The format for your next appointment:   In Person  Provider:   You may see Fransico Him, MD or one of the following Advanced Practice Providers on your designated Care Team:    Melina Copa, PA-C  Ermalinda Barrios, PA-C

## 2020-01-28 NOTE — [facility] Notes (Signed)
Cardiology Office Note:    Date:  01/28/2020   ID:  Marcus [name], DOB 1940-11-26, MRN [id number]  PCP:  Hulan Fess, MD  Cardiologist:  Fransico Him, MD    Referring MD: Hulan Fess, MD   Chief Complaint  Patient presents with  . Shortness of Breath  . Coronary Artery Disease  . Hyperlipidemia  . Hypertension    History of Present Illness:    Marcus [name] is a 79 y.o. male with a hx of  ASCAD, HTN and dyslipidemia.He has a history of elevated ALT and was evaluated by GI and US showed an enlarged liver. He stopped ETOH and tylenol and hisALT normalized.  LE arterial dopplers 20017 showed 30-49% left common femoral stenosis.    He is here today for evaluation of new DOE.   He tells me that he has been having problems with SOB for the past year that have become progressively worse.  This has been associated lately with extreme exhaustion and episode exertional chest pressure reminiscent of his prior anginal episodes.  He goes to [place] a lot and usually his wife walks 3 miles at a time on the beach but he can only walk 500 feet now and he even has to stop 1/2 way through that walk.  He gets significant DOE when walking up stairs as well.  He denies any  PND, orthopnea, LE edema, dizziness, palpitations or syncope.  He is compliant with his meds and is tolerating meds with no SE.    Past Medical History:  Diagnosis Date  . Benign essential HTN 09/09/2013  . Coronary artery disease 08/2013   S/P PCI of the LAD  . Dyslipidemia   . Elevated fasting glucose   . Fatty liver   . Glaucoma   . Gout   . Hyperlipidemia   . Hypertension   . Joint pain   . LAD (lymphadenopathy)   . Obesity   . Pure hypercholesterolemia 09/09/2013  . PVD (peripheral vascular disease) ([place]) 06/12/2018    30-49% left common femoral stenosis  . Vision abnormalities     Past Surgical History:  Procedure Laterality Date  . ADENOIDECTOMY    . CATARACT EXTRACTION    . CORONARY ANGIOPLASTY WITH  STENT PLACEMENT  2009  . EYE SURGERY    . TONSILLECTOMY    . WISDOM TOOTH EXTRACTION      Current Medications: Current Meds  Medication Sig  . allopurinol (ZYLOPRIM) 300 MG tablet Take 300 mg by mouth daily.  Marland Kitchen aspirin 81 MG tablet Take 81 mg by mouth daily.  Marland Kitchen atorvastatin (LIPITOR) 80 MG tablet Take 1 tablet (80 mg total) by mouth daily.  . dorzolamide-timolol (COSOPT) 22.3-6.8 MG/ML ophthalmic solution Place 1 drop into both eyes 2 (two) times daily.  Marland Kitchen latanoprost (XALATAN) 0.005 % ophthalmic solution Place 1 drop into both eyes daily.  Marland Kitchen losartan (COZAAR) 100 MG tablet Take 1 tablet (100 mg total) by mouth daily.  . metoprolol succinate (TOPROL-XL) 100 MG 24 hr tablet TAKE 1 TABLET DAILY WITH ORIMMEDIATELY FOLLOWING A    MEAL  . minocycline (MINOCIN,DYNACIN) 100 MG capsule Take 100 mg by mouth as needed. For skin acne  . nitroGLYCERIN (NITROSTAT) 0.4 MG SL tablet Place 0.4 mg under the tongue every 5 (five) minutes as needed for chest pain.  . Omega-3 Fatty Acids (FISH OIL) 1000 MG CAPS 2 capsules (2084m) by mouth two times a day  . pimecrolimus (ELIDEL) 1 % cream APPLY TO AFFECTED AREA ON FACE TWICE A  DAY AS NEEDED  . [DISCONTINUED] atorvastatin (LIPITOR) 80 MG tablet TAKE 1 TABLET DAILY  . [DISCONTINUED] losartan (COZAAR) 100 MG tablet TAKE 1 TABLET BY MOUTH  DAILY  . [DISCONTINUED] metoprolol succinate (TOPROL-XL) 100 MG 24 hr tablet TAKE 1 TABLET DAILY WITH ORIMMEDIATELY FOLLOWING A    MEAL     Allergies:   Patient has no known allergies.   Social History   Socioeconomic History  . Marital status: Married    Spouse name: Not on file  . Number of children: Not on file  . Years of education: Not on file  . Highest education level: Not on file  Occupational History  . Not on file  Tobacco Use  . Smoking status: Current Every Day Smoker    Types: Cigars  . Smokeless tobacco: Never Used  . Tobacco comment: daily  Substance and Sexual Activity  . Alcohol use: Yes     Alcohol/week: 1.0 standard drinks    Types: 1 Cans of beer per week    Comment: 2-3 beers daily  . Drug use: No  . Sexual activity: Not on file  Other Topics Concern  . Not on file  Social History Narrative  . Not on file   Social Determinants of Health   Financial Resource Strain:   . Difficulty of Paying Living Expenses:   Food Insecurity:   . Worried About Charity fundraiser in the Last Year:   . Arboriculturist in the Last Year:   Transportation Needs:   . Film/video editor (Medical):   Marland Kitchen Lack of Transportation (Non-Medical):   Physical Activity:   . Days of Exercise per Week:   . Minutes of Exercise per Session:   Stress:   . Feeling of Stress :   Social Connections:   . Frequency of Communication with Friends and Family:   . Frequency of Social Gatherings with Friends and Family:   . Attends Religious Services:   . Active Member of Clubs or Organizations:   . Attends Archivist Meetings:   Marland Kitchen Marital Status:      Family History: The patient's family history includes Breast cancer in his mother; CAD in his brother and sister; Heart attack in his father; Multiple sclerosis in his sister; Rheumatic fever in his father. There is no history of Neuropathy.  ROS:   Please see the history of present illness.    ROS  All other systems reviewed and negative.   EKGs/Labs/Other Studies Reviewed:    The following studies were reviewed today: none  EKG:  EKG is  ordered today.  The ekg ordered today demonstrates NSR with no ST changes and normal intervals  Recent Labs: No results found for requested labs within last 8760 hours.   Recent Lipid Panel    Component Value Date/Time   CHOL 109 06/08/2017 0934   CHOL 105 08/01/2014 0835   TRIG 175 (H) 06/08/2017 0934   TRIG 182 (H) 08/01/2014 0835   HDL 33 (L) 06/08/2017 0934   HDL 26 (L) 08/01/2014 0835   CHOLHDL 3.3 06/08/2017 0934   CHOLHDL 3.7 04/18/2016 0922   VLDL 41 (H) 04/18/2016 0922   LDLCALC  41 06/08/2017 0934   LDLCALC 43 08/01/2014 0835    Physical Exam:    VS:  BP 138/68   Pulse (!) 55   Ht _0  (1.905 m)   Wt 291 lb 12.8 oz (132.4 kg)   SpO2 99%   BMI 36.47 kg/m  Wt Readings from Last 3 Encounters:  01/28/20 291 lb 12.8 oz (132.4 kg)  06/18/19 290 lb (131.5 kg)  06/12/18 284 lb 3.2 oz (128.9 kg)     GEN: Well nourished, well developed in no acute distress HEENT: Normal NECK: No JVD; No carotid bruits LYMPHATICS: No lymphadenopathy CARDIAC:RRR, no murmurs, rubs, gallops RESPIRATORY:  Clear to auscultation without rales, wheezing or rhonchi  ABDOMEN: Soft, non-tender, non-distended MUSCULOSKELETAL:  No edema; No deformity  SKIN: Warm and dry NEUROLOGIC:  Alert and oriented x 3 PSYCHIATRIC:  Normal affect    ASSESSMENT:    1. Atherosclerosis of native coronary artery of native heart without angina pectoris   2. Benign essential HTN   3. Pure hypercholesterolemia   4. PVD (peripheral vascular disease) ([place])    PLAN:    In order of problems listed above:  1.  ASCAD/DOE -s/p remote PCI of LAD -now having new DOE and chest pressure with exertion as well as exhaustion with exertion reminiscent of prior anginal episodes -I will check a 2D echo to assess LVF -check Lexiscan myoview to rule out ischemia -check DDimer, BNP, TSH, CBC, BMET and Cxray -continue ASA 14m daily, BB and statin  2.  HTN -BP well controlled on exam today -cotinue Losartan 1071mdaily and Toprol XL 10043maily -check BMET  3.  HLD -LDL goal < 70 -check FLP in June -continue atorvastatin 37m85mily  4.  PVD -LE arterial dopplers 20017 showed 30-49% left common femoral stenosis.   -denies any claudication problems -encouraged walking program -continue statin  Medication Adjustments/Labs and Tests Ordered: Current medicines are reviewed at length with the patient today.  Concerns regarding medicines are outlined above.  No orders of the defined types were placed in  this encounter.  Meds ordered this encounter  Medications  . atorvastatin (LIPITOR) 80 MG tablet    Sig: Take 1 tablet (80 mg total) by mouth daily.    Dispense:  90 tablet    Refill:  3  . losartan (COZAAR) 100 MG tablet    Sig: Take 1 tablet (100 mg total) by mouth daily.    Dispense:  90 tablet    Refill:  3    Requesting 1 year supply  . metoprolol succinate (TOPROL-XL) 100 MG 24 hr tablet    Sig: TAKE 1 TABLET DAILY WITH ORIMMEDIATELY FOLLOWING A    MEAL    Dispense:  90 tablet    Refill:  3    Signed, TracFransico Him  01/28/2020 11:01 AM    [facility]

## 2020-01-29 ENCOUNTER — Other Ambulatory Visit: Payer: Medicare HMO | Admitting: *Deleted

## 2020-01-29 ENCOUNTER — Telehealth: Payer: Self-pay

## 2020-01-29 DIAGNOSIS — I10 Essential (primary) hypertension: Secondary | ICD-10-CM

## 2020-01-29 LAB — BASIC METABOLIC PANEL
BUN/Creatinine Ratio: 22 (ref 10–24)
BUN/Creatinine Ratio: 23 (ref 10–24)
BUN: 34 mg/dL — ABNORMAL HIGH (ref 8–27)
BUN: 37 mg/dL — ABNORMAL HIGH (ref 8–27)
CO2: 21 mmol/L (ref 20–29)
CO2: 25 mmol/L (ref 20–29)
Calcium: 9.9 mg/dL (ref 8.6–10.2)
Calcium: 9.5 mg/dL (ref 8.6–10.2)
Chloride: 109 mmol/L — ABNORMAL HIGH (ref 96–106)
Chloride: 105 mmol/L (ref 96–106)
Creatinine, Ser: 1.54 mg/dL — ABNORMAL HIGH (ref 0.76–1.27)
Creatinine, Ser: 1.58 mg/dL — ABNORMAL HIGH (ref 0.76–1.27)
GFR calc Af Amer: 50 mL/min/{1.73_m2} — ABNORMAL LOW (ref >59)
GFR calc Af Amer: 48 mL/min/{1.73_m2} — ABNORMAL LOW (ref >59)
GFR calc non Af Amer: 43 mL/min/{1.73_m2} — ABNORMAL LOW (ref >59)
GFR calc non Af Amer: 42 mL/min/{1.73_m2} — ABNORMAL LOW (ref >59)
Glucose: 124 mg/dL — ABNORMAL HIGH (ref 65–99)
Glucose: 145 mg/dL — ABNORMAL HIGH (ref 65–99)
Potassium: 6 mmol/L (ref 3.5–5.2)
Potassium: 4.7 mmol/L (ref 3.5–5.2)
Sodium: 145 mmol/L — ABNORMAL HIGH (ref 134–144)
Sodium: 138 mmol/L (ref 134–144)

## 2020-01-29 LAB — CBC
Hematocrit: 42.3 % (ref 37.5–51.0)
Hemoglobin: 14 g/dL (ref 13.0–17.7)
MCH: 33.1 pg — ABNORMAL HIGH (ref 26.6–33.0)
MCHC: 33.1 g/dL (ref 31.5–35.7)
MCV: 100 fL — ABNORMAL HIGH (ref 79–97)
Platelets: 184 10*3/uL (ref 150–450)
RBC: 4.23 x10E6/uL (ref 4.14–5.80)
RDW: 13.1 % (ref 11.6–15.4)
WBC: 9.4 10*3/uL (ref 3.4–10.8)

## 2020-01-29 LAB — PRO B NATRIURETIC PEPTIDE: NT-Pro BNP: 76 pg/mL (ref 0–486)

## 2020-01-29 LAB — TSH: TSH: 2.75 u[IU]/mL (ref 0.450–4.500)

## 2020-01-29 NOTE — Telephone Encounter (Signed)
Spoke with the patient and he will come in today ASAP for repeat lab work.

## 2020-01-29 NOTE — Telephone Encounter (Signed)
-----  Message from Sueanne Margarita, MD sent at 01/29/2020  9:11 AM EDT ----- Regarding: RE: [facility] Notification Patient needs to come in this am for repeat stat BMET due to high K+ on labs  Traci ----- Message ----- From: Milus Banister, MD Sent: 01/29/2020   1:57 AM EDT To: Sueanne Margarita, MD Subject: [facility] Notification                           Hi Dr. Radford Pax -   [facility] called me this morning about patient's potassium.  It was 6.0 on yesterday's lab. I saw you had reviewed the labs last night and messaged the patient but just wanted to make sure you were aware of this.  Thanks,  Ander Purpura

## 2020-01-30 ENCOUNTER — Other Ambulatory Visit (HOSPITAL_COMMUNITY): Payer: Medicare HMO

## 2020-01-30 DIAGNOSIS — R05 Cough: Secondary | ICD-10-CM | POA: Diagnosis not present

## 2020-01-31 ENCOUNTER — Other Ambulatory Visit: Payer: Self-pay

## 2020-01-31 ENCOUNTER — Ambulatory Visit (HOSPITAL_COMMUNITY): Payer: Medicare HMO | Attending: Cardiology

## 2020-01-31 DIAGNOSIS — R0609 Other forms of dyspnea: Secondary | ICD-10-CM

## 2020-01-31 DIAGNOSIS — R0600 Dyspnea, unspecified: Secondary | ICD-10-CM | POA: Diagnosis present

## 2020-01-31 DIAGNOSIS — I77810 Thoracic aortic ectasia: Secondary | ICD-10-CM | POA: Diagnosis present

## 2020-02-02 ENCOUNTER — Encounter: Payer: Self-pay | Admitting: Cardiology

## 2020-02-02 DIAGNOSIS — I77810 Thoracic aortic ectasia: Secondary | ICD-10-CM | POA: Insufficient documentation

## 2020-02-03 NOTE — [facility] Notes (Unsigned)
The patient has been notified of the result and verbalized understanding.  All questions (if any) were answered. Antonieta Iba, RN 02/03/2020 11:29 AM

## 2020-02-11 ENCOUNTER — Telehealth (HOSPITAL_COMMUNITY): Payer: Self-pay

## 2020-02-11 NOTE — Telephone Encounter (Signed)
Spoke with the patient, detailed instructions were given. He stated that he understood and would be here for test. Asked to call back with any questions. S.Nazirah Tri EMTP

## 2020-02-13 ENCOUNTER — Other Ambulatory Visit: Payer: Self-pay

## 2020-02-13 ENCOUNTER — Ambulatory Visit (HOSPITAL_COMMUNITY): Payer: Medicare HMO | Attending: Cardiovascular Disease

## 2020-02-13 DIAGNOSIS — R0600 Dyspnea, unspecified: Secondary | ICD-10-CM

## 2020-02-13 DIAGNOSIS — R0609 Other forms of dyspnea: Secondary | ICD-10-CM

## 2020-02-13 MED ORDER — REGADENOSON 0.4 MG/5ML IV SOLN
0.4000 mg | Freq: Once | INTRAVENOUS | Status: AC
  Administered 2020-02-13: 0.4 mg via INTRAVENOUS

## 2020-02-13 MED ORDER — TECHNETIUM TC 99M TETROFOSMIN IV KIT
31.6000 | PACK | Freq: Once | INTRAVENOUS | Status: AC | PRN
  Administered 2020-02-13: 31.6 via INTRAVENOUS
  Filled 2020-02-13: qty 32

## 2020-02-14 ENCOUNTER — Ambulatory Visit (HOSPITAL_COMMUNITY): Payer: Medicare HMO | Attending: Cardiology

## 2020-02-14 LAB — MYOCARDIAL PERFUSION IMAGING
LV dias vol: 120 mL (ref 62–150)
LV sys vol: 45 mL
Peak HR: 66 {beats}/min
Rest HR: 57 {beats}/min
SDS: 2
SRS: 0
SSS: 2
TID: 0.92

## 2020-02-14 MED ORDER — TECHNETIUM TC 99M TETROFOSMIN IV KIT
30.3000 | PACK | Freq: Once | INTRAVENOUS | Status: AC | PRN
  Filled 2020-02-14: qty 31

## 2020-02-17 ENCOUNTER — Telehealth: Payer: Self-pay

## 2020-02-17 DIAGNOSIS — R0609 Other forms of dyspnea: Secondary | ICD-10-CM

## 2020-02-17 NOTE — Telephone Encounter (Signed)
PFTs have been ordered and patient is aware that someone will be calling him to get it scheduled.

## 2020-02-17 NOTE — Telephone Encounter (Signed)
-----  Message from Sueanne Margarita, MD sent at 02/14/2020  5:37 PM EDT ----- Please order PFTs with [facility]

## 2020-03-11 DIAGNOSIS — G629 Polyneuropathy, unspecified: Secondary | ICD-10-CM | POA: Diagnosis not present

## 2020-03-11 DIAGNOSIS — I10 Essential (primary) hypertension: Secondary | ICD-10-CM | POA: Diagnosis not present

## 2020-03-11 DIAGNOSIS — I77810 Thoracic aortic ectasia: Secondary | ICD-10-CM | POA: Diagnosis not present

## 2020-03-11 DIAGNOSIS — N183 Chronic kidney disease, stage 3 unspecified: Secondary | ICD-10-CM | POA: Diagnosis not present

## 2020-03-11 DIAGNOSIS — I739 Peripheral vascular disease, unspecified: Secondary | ICD-10-CM | POA: Diagnosis not present

## 2020-03-11 DIAGNOSIS — I25119 Atherosclerotic heart disease of native coronary artery with unspecified angina pectoris: Secondary | ICD-10-CM | POA: Diagnosis not present

## 2020-03-11 DIAGNOSIS — R7301 Impaired fasting glucose: Secondary | ICD-10-CM | POA: Diagnosis not present

## 2020-03-11 DIAGNOSIS — Z0000 Encounter for general adult medical examination without abnormal findings: Secondary | ICD-10-CM | POA: Diagnosis not present

## 2020-03-11 DIAGNOSIS — Z8739 Personal history of other diseases of the musculoskeletal system and connective tissue: Secondary | ICD-10-CM | POA: Diagnosis not present

## 2020-03-11 DIAGNOSIS — Z23 Encounter for immunization: Secondary | ICD-10-CM | POA: Diagnosis not present

## 2020-03-28 DIAGNOSIS — H401113 Primary open-angle glaucoma, right eye, severe stage: Secondary | ICD-10-CM | POA: Diagnosis not present

## 2020-03-28 DIAGNOSIS — H401123 Primary open-angle glaucoma, left eye, severe stage: Secondary | ICD-10-CM | POA: Diagnosis not present

## 2020-04-02 ENCOUNTER — Other Ambulatory Visit: Payer: Self-pay

## 2020-04-02 ENCOUNTER — Ambulatory Visit (INDEPENDENT_AMBULATORY_CARE_PROVIDER_SITE_OTHER): Payer: Medicare HMO | Admitting: Internal Medicine

## 2020-04-02 DIAGNOSIS — R0609 Other forms of dyspnea: Secondary | ICD-10-CM

## 2020-04-02 DIAGNOSIS — R0600 Dyspnea, unspecified: Secondary | ICD-10-CM

## 2020-04-02 LAB — PULMONARY FUNCTION TEST
DL/VA % pred: 103 %
DL/VA: 3.99 ml/min/mmHg/L
DLCO cor % pred: 81 %
DLCO cor: 23.34 ml/min/mmHg
DLCO unc % pred: 81 %
DLCO unc: 23.34 ml/min/mmHg
FEF 25-75 Post: 3.57 L/sec
FEF 25-75 Pre: 2.75 L/sec
FEF2575-%Change-Post: 29 %
FEF2575-%Pred-Post: 136 %
FEF2575-%Pred-Pre: 105 %
FEV1-%Change-Post: 5 %
FEV1-%Pred-Post: 87 %
FEV1-%Pred-Pre: 83 %
FEV1-Post: 3.2 L
FEV1-Pre: 3.04 L
FEV1FVC-%Change-Post: 4 %
FEV1FVC-%Pred-Pre: 110 %
FEV6-%Change-Post: 1 %
FEV6-%Pred-Post: 81 %
FEV6-%Pred-Pre: 80 %
FEV6-Post: 3.85 L
FEV6-Pre: 3.8 L
FEV6FVC-%Change-Post: 0 %
FEV6FVC-%Pred-Post: 105 %
FEV6FVC-%Pred-Pre: 105 %
FVC-%Change-Post: 1 %
FVC-%Pred-Post: 76 %
FVC-%Pred-Pre: 75 %
FVC-Post: 3.85 L
FVC-Pre: 3.82 L
Post FEV1/FVC ratio: 83 %
Post FEV6/FVC ratio: 100 %
Pre FEV1/FVC ratio: 80 %
Pre FEV6/FVC Ratio: 99 %
RV % pred: 72 %
RV: 2.08 L
TLC % pred: 71 %
TLC: 5.76 L

## 2020-04-02 NOTE — [facility] Notes (Signed)
PFT done today. 

## 2020-04-03 ENCOUNTER — Telehealth: Payer: Self-pay

## 2020-04-03 DIAGNOSIS — R0609 Other forms of dyspnea: Secondary | ICD-10-CM

## 2020-04-03 NOTE — Telephone Encounter (Signed)
-----  Message from Sueanne Margarita, MD sent at 04/03/2020 10:22 AM EDT ----- Minimal restrictive airway disease>>unlikely causing SOB.  This is probably due to obesity. Stress test for SOB was normal and echo as well.  Please refer to Dr. Chase Caller with Pulmonary

## 2020-04-03 NOTE — Telephone Encounter (Signed)
The patient has been notified of the result and verbalized understanding.  All questions (if any) were answered. Antonieta Iba, RN 04/03/2020 10:49 AM  Referral placed for patient to see Dr. Chase Caller.

## 2020-05-07 DIAGNOSIS — H401123 Primary open-angle glaucoma, left eye, severe stage: Secondary | ICD-10-CM | POA: Diagnosis not present

## 2020-05-07 DIAGNOSIS — H401113 Primary open-angle glaucoma, right eye, severe stage: Secondary | ICD-10-CM | POA: Diagnosis not present

## 2020-06-04 DIAGNOSIS — R69 Illness, unspecified: Secondary | ICD-10-CM | POA: Diagnosis not present

## 2020-06-08 ENCOUNTER — Institutional Professional Consult (permissible substitution): Payer: Medicare HMO | Admitting: Internal Medicine

## 2020-06-09 ENCOUNTER — Encounter: Payer: Self-pay | Admitting: Internal Medicine

## 2020-06-09 ENCOUNTER — Telehealth: Payer: Self-pay | Admitting: Internal Medicine

## 2020-06-09 ENCOUNTER — Ambulatory Visit: Payer: Medicare HMO | Admitting: Internal Medicine

## 2020-06-09 ENCOUNTER — Other Ambulatory Visit: Payer: Self-pay

## 2020-06-09 VITALS — BP 150/70 | HR 77 | Temp 97.0°F | Ht 75.0 in | Wt 296.4 lb

## 2020-06-09 DIAGNOSIS — Z87898 Personal history of other specified conditions: Secondary | ICD-10-CM

## 2020-06-09 DIAGNOSIS — R0600 Dyspnea, unspecified: Secondary | ICD-10-CM

## 2020-06-09 DIAGNOSIS — R05 Cough: Secondary | ICD-10-CM

## 2020-06-09 DIAGNOSIS — R0609 Other forms of dyspnea: Secondary | ICD-10-CM

## 2020-06-09 DIAGNOSIS — R053 Chronic cough: Secondary | ICD-10-CM

## 2020-06-09 LAB — CBC WITH DIFFERENTIAL/PLATELET
Basophils Absolute: 0.1 10*3/uL (ref 0.0–0.1)
Basophils Relative: 0.6 % (ref 0.0–3.0)
Eosinophils Absolute: 0.4 10*3/uL (ref 0.0–0.7)
Eosinophils Relative: 4.3 % (ref 0.0–5.0)
HCT: 40.9 % (ref 39.0–52.0)
Hemoglobin: 13.6 g/dL (ref 13.0–17.0)
Lymphocytes Relative: 20.2 % (ref 12.0–46.0)
Lymphs Abs: 1.9 10*3/uL (ref 0.7–4.0)
MCHC: 33.1 g/dL (ref 30.0–36.0)
MCV: 100.9 fl — ABNORMAL HIGH (ref 78.0–100.0)
Monocytes Absolute: 1 10*3/uL (ref 0.1–1.0)
Monocytes Relative: 10.9 % (ref 3.0–12.0)
Neutro Abs: 5.9 10*3/uL (ref 1.4–7.7)
Neutrophils Relative %: 64 % (ref 43.0–77.0)
Platelets: 184 10*3/uL (ref 150.0–400.0)
RBC: 4.06 Mil/uL — ABNORMAL LOW (ref 4.22–5.81)
RDW: 14.2 % (ref 11.5–15.5)
WBC: 9.3 10*3/uL (ref 4.0–10.5)

## 2020-06-09 NOTE — Patient Instructions (Signed)
ICD-10-CM   1. DOE (dyspnea on exertion)  R06.00   2. Chronic cough  R05   3. History of snoring  Z87.898    DOE (dyspnea on exertion) Chronic cough  - need to rule out Pulmonary Fibrosis or asthma or copd/emphysema  - other reasons for symptoms can be weight, stiff heart muscle and physical fitness  Plan  - do simple walk test 06/09/2020 = do symptom score 06/09/2020  = do HRCT supine and prone - next 4-8 weeks  - do CBC with diff, IgE  06/09/2020  - evaluate for mold in [facility] duct and get it cleaned out    History of snoring STOP-BANG score 6 of 8  Plan  - will send message to Dr Radford Pax to consider sleep study  Followup  - review in < 4 - 8 weeks with myself or APP to go over results

## 2020-06-09 NOTE — Telephone Encounter (Signed)
Hi Traci  Thanks for referreing [employer]  .  I am going to get a high-resolution CT chest to rule out interstitial lung disease.  Meanwhile I wonder if he needs a sleep study.  This is because of his obesity and also weight gain and excessive daytime somnolence.  He also has hypertension.  I did the stop bang questionnaire and it is 6 out of 8.  I told him I will let you know.  He is traveling back to [place] and cannot do any test for the next 4 weeks.  I also think that he wants to get tested with a sleep study while at his house   Thanks  MR    SIGNATURE    Dr. Brand Males, M.D., F.C.C.P,  Pulmonary and Critical Care Medicine Staff Physician, [place] Director - Interstitial Lung Disease  Program  [facility] [place] [facility] [place], [place], [address]  Pager: [phone], If no answer  OR between  19:00-7:00h: page [phone] Telephone (clinical office): [phone] Telephone (research): [phone]  2:49 PM 06/09/2020

## 2020-06-09 NOTE — Telephone Encounter (Signed)
Thanks for the note.  I just sent my nurse, Gae Bon, an order for PSG and cc'd you on it  Kieu Quiggle

## 2020-06-09 NOTE — Progress Notes (Signed)
OV 06/09/2020  Subjective:  Patient ID: Marcus [name], male , DOB: [birth year] , age 79 y.o. , MRN: [id number] , ADDRESS: [id number] [place] [address]  PCP Hulan Fess, MD Cardiologist Dr. Golden Hurter   06/09/2020 -   Chief Complaint  Patient presents with  . Consult    sob for the last year, worse for the last 6 month.     HPI Marcus [name] 79 y.o. -79 year old male with a history of atherosclerotic coronary artery disease [status post remote PCI of LAD], hypertension and hyperlipidemia.  History of transaminitis that resolved after stopping ethanol and Tylenol.  Lower extremity Dopplers in 2017 30-49% left common femoral arterial stenosis.  He saw Dr. Radford Pax cardiology in May 2021 with new onset of shortness of breath that is progressive.  Associated with exertional chest pressure.  At baseline he goes to [place] a lot and walks 3 miles [place] the beach but as of May 2021 he was only able to walk 5 feet and had to stop halfway through that walk.  Also notices walking up stairs relieved by rest.  Overall dyspnea on exertion for 1 year.  Worse in the last 6 months but he also says that he some better in the last few months.  Denies any proximal nocturnal dyspnea orthopnea or lower extremity edema dizziness or palpitations or syncope.  Because of this he underwent nuclear medicine cardiac stress test February 14, 2020 that was low risk with ejection fraction 65%.  But he has had persistent symptoms and therefore has been referred here.  He then had pulmonary function test April 02, 2020 that shows mild restriction with FVC 3.82 L / 75% total lung capacity 5.76/71% but normal DLCO.  But because of persistent symptoms has been referred here.  Of note his echo showed grade 1 diastolic dysfunction.  Of note he also has a chronic cough during the same time.  -He tells me that recently his [facility] duct died out and after that the cough improved when the [facility] duct was off.  He is  now going to get his [facility] duct evaluated.    He also reports 10 pound weight gain during this time.  Normally is around 290 pounds.  Now he is at 300 pounds.  He is also been more sedentary because of the pandemic.  He says during this time his wife has noticed that he occasionally snores.  He does fall easily fatigued during the daytime.  He also has disrupted sleep pattern.  I did a stop bang questionnaire and it is 6 out of 8 suggesting significantly elevated risk for sleep apnea.  He has never had a sleep study.`  He is going back to [place], [place] where he owns another home.  He will not be able to do test in the next few weeks.   SYMPTOM SCALE -  06/09/2020   O2 use ra  Shortness of Breath 0 -> 5 scale with 5 being worst (score 6 If unable to do)  At rest 0  Simple tasks - showers, clothes change, eating, shaving 1  Household (dishes, doing bed, laundry) 2  Shopping 2  Walking level at own pace 3  Walking up Stairs 3  Total (30-36) Dyspnea Score 11  How bad is your cough? 2  How bad is your fatigue 4  How bad is nausea 0  How bad is vomiting?  00  How bad is diarrhea? 0  How  bad is anxiety? 0  How bad is depression 0       Simple office walk 185 feet x  3 laps goal with forehead probe 06/09/2020   O2 used ra  Number laps completed 3  Comments about pace nl  Resting Pulse Ox/HR 100% and 61/min  Final Pulse Ox/HR 97% and 98/min  Desaturated </= 88% no  Desaturated <= 3% points yes  Got Tachycardic >/= 90/min yes  Symptoms at end of test x  Miscellaneous comments x       PFT Results Latest Ref Rng & Units 04/02/2020  FVC-Pre L 3.82  FVC-Predicted Pre % 75  FVC-Post L 3.85  FVC-Predicted Post % 76  Pre FEV1/FVC % % 80  Post FEV1/FCV % % 83  FEV1-Pre L 3.04  FEV1-Predicted Pre % 83  FEV1-Post L 3.20  DLCO uncorrected ml/min/mmHg 23.34  DLCO UNC% % 81  DLCO corrected ml/min/mmHg 23.34  DLCO COR %Predicted % 81  DLVA Predicted % 103  TLC L 5.76  TLC %  Predicted % 71  RV % Predicted % 72   Results for TEVIS, DUNAVAN (MRN [id number]) as of 06/09/2020 14:16  Ref. Range 03/23/2017 09:13  Please Note (HCV): Unknown Comment  Anti Nuclear Antibody (ANA) Latest Ref Range: Negative  Negative  ANCA Proteinase 3 Latest Ref Range: 0.0 - 3.5 U/mL <3.5  Atypical pANCA Latest Ref Range: Neg:<1:20 titer <1:20  ENA SSA (RO) Ab Latest Ref Range: 0.0 - 0.9 AI <0.2  ENA SSB (LA) Ab Latest Ref Range: 0.0 - 0.9 AI <0.2  Deamidated Gliadin Abs, IgG Latest Ref Range: 0 - 19 units 2  Myeloperoxidase Ab Latest Ref Range: 0.0 - 9.0 U/mL <9.0  RA Latex Turbid. Latest Ref Range: 0.0 - 13.9 IU/mL <10.0  Transglutaminase IgA Latest Ref Range: 0 - 3 U/mL <2  Cytoplasmic (C-ANCA) Latest Ref Range: Neg:<1:20 titer <1:20  P-ANCA Latest Ref Range: Neg:<1:20 titer <1:20  Antigliadin Abs, IgA Latest Ref Range: 0 - 19 units 6  IgG (Immunoglobin G), Serum Latest Ref Range: 700 - 1,600 mg/dL 825  IgM (Immunoglobulin M), Srm Latest Ref Range: 15 - 143 mg/dL 126  IgA/Immunoglobulin A, Serum Latest Ref Range: 61 - 437 mg/dL 312  Lyme IgG/IgM Ab Latest Ref Range: 0.00 - 0.90 ISR <0.91    ROS - per HPI     has a past medical history of Ascending aorta dilatation (HCC), Benign essential HTN (09/09/2013), Coronary artery disease (08/2013), Dyslipidemia, Elevated fasting glucose, Fatty liver, Glaucoma, Gout, Hyperlipidemia, Hypertension, Joint pain, LAD (lymphadenopathy), Obesity, Pure hypercholesterolemia (09/09/2013), PVD (peripheral vascular disease) ([place]) (06/12/2018), and Vision abnormalities.   reports that he quit smoking about 4 months ago. His smoking use included cigars. He quit after 10.00 years of use. He has never used smokeless tobacco.  Past Surgical History:  Procedure Laterality Date  . ADENOIDECTOMY    . CATARACT EXTRACTION    . CORONARY ANGIOPLASTY WITH STENT PLACEMENT  2009  . EYE SURGERY    . TONSILLECTOMY    . WISDOM TOOTH EXTRACTION      No Known  Allergies  Immunization History  Administered Date(s) Administered  . Influenza, High Dose Seasonal PF 06/06/2019  . PFIZER SARS-COV-2 Vaccination 09/28/2019, 10/19/2019, 06/08/2020    Family History  Problem Relation Age of Onset  . Breast cancer Mother   . Heart attack Father   . Rheumatic fever Father   . CAD Sister   . Multiple sclerosis Sister   . CAD Brother   .  Neuropathy Neg Hx      Current Outpatient Medications:  .  allopurinol (ZYLOPRIM) 300 MG tablet, Take 300 mg by mouth daily., Disp: , Rfl:  .  aspirin 81 MG tablet, Take 81 mg by mouth daily., Disp: , Rfl:  .  atorvastatin (LIPITOR) 80 MG tablet, Take 1 tablet (80 mg total) by mouth daily., Disp: 90 tablet, Rfl: 3 .  dorzolamide-timolol (COSOPT) 22.3-6.8 MG/ML ophthalmic solution, Place 1 drop into both eyes 2 (two) times daily., Disp: , Rfl:  .  latanoprost (XALATAN) 0.005 % ophthalmic solution, Place 1 drop into both eyes daily., Disp: , Rfl:  .  losartan (COZAAR) 100 MG tablet, Take 1 tablet (100 mg total) by mouth daily., Disp: 90 tablet, Rfl: 3 .  metoprolol succinate (TOPROL-XL) 100 MG 24 hr tablet, TAKE 1 TABLET DAILY WITH ORIMMEDIATELY FOLLOWING A    MEAL, Disp: 90 tablet, Rfl: 3 .  Omega-3 Fatty Acids (FISH OIL) 1000 MG CAPS, 2 capsules (2054m) by mouth two times a day, Disp: , Rfl:  .  pimecrolimus (ELIDEL) 1 % cream, APPLY TO AFFECTED AREA ON FACE TWICE A DAY AS NEEDED, Disp: , Rfl:  .  tamsulosin (FLOMAX) 0.4 MG CAPS capsule, Take 0.4 mg by mouth daily., Disp: , Rfl:  .  minocycline (MINOCIN,DYNACIN) 100 MG capsule, Take 100 mg by mouth as needed. For skin acne (Patient not taking: Reported on 06/09/2020), Disp: , Rfl: 3 .  nitroGLYCERIN (NITROSTAT) 0.4 MG SL tablet, Place 0.4 mg under the tongue every 5 (five) minutes as needed for chest pain. (Patient not taking: Reported on 06/09/2020), Disp: , Rfl:  No current facility-administered medications for this visit.  Facility-Administered Medications Ordered  in Other Visits:  .  technetium tetrofosmin (TC-MYOVIEW) injection 303.4millicurie, 374.2millicurie, Intravenous, Once PRN, Turner, Traci R, MD      Objective:   Vitals:   06/09/20 1418  BP: (!) 150/70  Pulse: 77  Temp: (!) 97 F (36.1 C)  TempSrc: Temporal  SpO2: 97%  Weight: 296 lb 6.4 oz (134.4 kg)  Height: _0  (1.905 m)    Estimated body mass index is 37.05 kg/m as calculated from the following:   Height as of this encounter: _1  (1.905 m).   Weight as of this encounter: 296 lb 6.4 oz (134.4 kg).  _2 @  [facility]  06/09/20 1418  Weight: 296 lb 6.4 oz (134.4 kg)     Physical Exam  General Appearance:    Alert, cooperative, no distress, appears stated age - YES , Deconditioned looking - NO , OBESE  - YES, Sitting on Wheelchair -  NO  Head:    Normocephalic, without obvious abnormality, atraumatic  Eyes:    PERRL, conjunctiva/corneas clear,  Ears:    Normal TM's and external ear canals, both ears  Nose:   Nares normal, septum midline, mucosa normal, no drainage    or sinus tenderness. OXYGEN ON  - NO . Patient is @ RA   Throat:   Lips, mucosa, and tongue normal; teeth and gums normal. Cyanosis on lips - NO. MALLAMPATTI CLASS 3  Neck:   Supple, symmetrical, trachea midline, no adenopathy;    thyroid:  no enlargement/tenderness/nodules; no carotid   bruit or JVD  Back:     Symmetric, no curvature, ROM normal, no CVA tenderness  Lungs:     Distress - NO , Wheeze NO, Barrell Chest - NO, Purse lip breathing - NO, Crackles - NO   Chest Wall:  No tenderness or deformity.    Heart:    Regular rate and rhythm, S1 and S2 normal, no rub   or gallop, Murmur - NO  Breast Exam:    NOT DONE  Abdomen:     Soft, non-tender, bowel sounds active all four quadrants,    no masses, no organomegaly. Visceral obesity - YES  Genitalia:   NOT DONE  Rectal:   NOT DONE  Extremities:   Extremities - normal, Has Cane - NO, Clubbing - NO, Edema - NO  Pulses:   2+ and  symmetric all extremities  Skin:   Stigmata of Connective Tissue Disease - NO  Lymph nodes:   Cervical, supraclavicular, and axillary nodes normal  Psychiatric:  Neurologic:   Pleasant - YES, Anxious - NO, Flat affect - NO  CAm-ICU - neg, Alert and Oriented x 3 - yes, Moves all 4s - yes, Speech - normal, Cognition - intact           Assessment:       ICD-10-CM   1. DOE (dyspnea on exertion)  R06.00   2. Chronic cough  R05   3. History of snoring  Z87.898        Plan:     Patient Instructions     ICD-10-CM   1. DOE (dyspnea on exertion)  R06.00   2. Chronic cough  R05   3. History of snoring  Z87.898    DOE (dyspnea on exertion) Chronic cough  - need to rule out Pulmonary Fibrosis or asthma or copd/emphysema  - other reasons for symptoms can be weight, stiff heart muscle and physical fitness  Plan  - do simple walk test 06/09/2020 = do symptom score 06/09/2020  = do HRCT supine and prone - next 4-8 weeks  - do CBC with diff, IgE  06/09/2020  - evaluate for mold in [facility] duct and get it cleaned out    History of snoring STOP-BANG score 6 of 8  Plan  - will send message to Dr Radford Pax to consider sleep study  Followup  - review in < 4 - 8 weeks with myself or APP to go over results     ( Level 05 visit:  New 60-74 min   in  visit type: on-site physical face to visit  in total care time and counseling or/and coordination of care by this undersigned MD - Dr Brand Males. This includes one or more of the following on this same day 06/09/2020: pre-charting, chart review, note writing, documentation discussion of test results, diagnostic or treatment recommendations, prognosis, risks and benefits of management options, instructions, education, compliance or risk-factor reduction. It excludes time spent by the [place] or office staff in the care of the patient. Actual time 68 min)   SIGNATURE    Dr. Brand Males, M.D., F.C.C.P,  Pulmonary and Critical Care  Medicine Staff Physician, [place] Director - Interstitial Lung Disease  Program  Pulmonary [place] at [place], [place], [address]  Pager: [phone], If no answer or between  15:00h - 7:00h: call [phone] Telephone: [phone]  2:47 PM 06/09/2020

## 2020-06-10 LAB — IGE: IgE (Immunoglobulin E), Serum: 37 kU/L (ref <=114)

## 2020-06-10 NOTE — Telephone Encounter (Signed)
Thank you

## 2020-06-11 ENCOUNTER — Telehealth: Payer: Self-pay | Admitting: *Deleted

## 2020-06-11 DIAGNOSIS — G4733 Obstructive sleep apnea (adult) (pediatric): Secondary | ICD-10-CM

## 2020-06-11 DIAGNOSIS — H401113 Primary open-angle glaucoma, right eye, severe stage: Secondary | ICD-10-CM | POA: Diagnosis not present

## 2020-06-11 NOTE — Telephone Encounter (Signed)
-----  Message from Sueanne Margarita, MD sent at 06/09/2020  3:44 PM EDT ----- Marcus [name]  Please order a PSG for sleep apnea  Traci ----- Message ----- From: Brand Males, MD Sent: 06/09/2020   3:28 PM EDT To: Sueanne Margarita, MD

## 2020-06-11 NOTE — Telephone Encounter (Signed)
NPSG  sent to sleep pool

## 2020-06-14 ENCOUNTER — Telehealth: Payer: Self-pay | Admitting: *Deleted

## 2020-06-14 NOTE — Telephone Encounter (Signed)
PA for in lab sleep study submitted to [place] via web portal.

## 2020-06-22 ENCOUNTER — Telehealth: Payer: Self-pay | Admitting: *Deleted

## 2020-06-22 NOTE — Telephone Encounter (Signed)
-----  Message from Freada Bergeron, [place] sent at 06/11/2020  5:51 PM EDT ----- Regarding: preCert NPSG

## 2020-06-22 NOTE — Telephone Encounter (Signed)
Checked NSPG PA request via [employer] portal. Case still pending. Case # [id number].

## 2020-06-25 NOTE — Telephone Encounter (Signed)
Fax came today for patient REF# [id number] NPSG DENIED

## 2020-07-08 DIAGNOSIS — H401123 Primary open-angle glaucoma, left eye, severe stage: Secondary | ICD-10-CM | POA: Diagnosis not present

## 2020-07-08 DIAGNOSIS — H401113 Primary open-angle glaucoma, right eye, severe stage: Secondary | ICD-10-CM | POA: Diagnosis not present

## 2020-07-08 NOTE — Telephone Encounter (Signed)
I referred him to Dr. Golden Hurter who is his cardiologist but also she is a sleep doctor.  I do not do sleep medicine

## 2020-07-09 ENCOUNTER — Telehealth: Payer: Self-pay | Admitting: *Deleted

## 2020-07-09 NOTE — Telephone Encounter (Signed)
HST is fine

## 2020-07-09 NOTE — Telephone Encounter (Addendum)
NPSG denied from [employer], Home sleep Study sent to sleep pool.

## 2020-07-09 NOTE — Telephone Encounter (Addendum)
Will re-submit to sleep pool for a Home sleep study.

## 2020-07-09 NOTE — Telephone Encounter (Signed)
-----  Message -----  From: Tye Savoy  Sent: 06/24/2020  2:53 PM EDT  To: Freada Bergeron, CMA  Subject: RE: preCert                    [employer] has denied the sleep study for this patient. A fax came through about it. Do you need me to send that to you?  ----- Message -----  From: Freada Bergeron, CMA  Sent: 06/11/2020  5:51 PM EDT  To: [facility] Sleep Studies  Subject: preCert                      NPSG

## 2020-07-09 NOTE — Addendum Note (Signed)
Addended by: Freada Bergeron on: 07/09/2020 05:40 PM   Modules accepted: Orders

## 2020-07-13 NOTE — Telephone Encounter (Signed)
Reference denial letter #[id number]

## 2020-07-14 ENCOUNTER — Telehealth: Payer: Self-pay | Admitting: Cardiology

## 2020-07-14 NOTE — Telephone Encounter (Signed)
No PA required for HST.  Waiting on sleep lab to call back to schedule.

## 2020-07-15 ENCOUNTER — Telehealth: Payer: Self-pay | Admitting: *Deleted

## 2020-07-15 ENCOUNTER — Other Ambulatory Visit: Payer: Self-pay

## 2020-07-15 ENCOUNTER — Ambulatory Visit (INDEPENDENT_AMBULATORY_CARE_PROVIDER_SITE_OTHER)
Admission: RE | Admit: 2020-07-15 | Discharge: 2020-07-15 | Disposition: A | Discharge status: Home / Self Care | Payer: Medicare HMO | Source: Ambulatory Visit | Attending: Internal Medicine | Admitting: Internal Medicine

## 2020-07-15 DIAGNOSIS — R0600 Dyspnea, unspecified: Secondary | ICD-10-CM

## 2020-07-15 DIAGNOSIS — R0602 Shortness of breath: Secondary | ICD-10-CM | POA: Diagnosis not present

## 2020-07-15 DIAGNOSIS — R059 Cough, unspecified: Secondary | ICD-10-CM | POA: Diagnosis not present

## 2020-07-15 DIAGNOSIS — R053 Chronic cough: Secondary | ICD-10-CM | POA: Diagnosis not present

## 2020-07-15 DIAGNOSIS — R0609 Other forms of dyspnea: Secondary | ICD-10-CM

## 2020-07-15 NOTE — Telephone Encounter (Signed)
-----  Message from Tye Savoy sent at 07/14/2020  2:13 PM EDT ----- Regarding: RE: precert No PA required for HST.  Waiting on sleep lab to call back to schedule. ----- Message ----- From: Freada Bergeron, CMA Sent: 07/09/2020   5:37 PM EDT To: [facility] Sleep Studies Subject: precert                                        Home sleep test

## 2020-07-16 DIAGNOSIS — H401123 Primary open-angle glaucoma, left eye, severe stage: Secondary | ICD-10-CM | POA: Diagnosis not present

## 2020-07-23 ENCOUNTER — Other Ambulatory Visit: Payer: Self-pay

## 2020-07-23 ENCOUNTER — Encounter: Payer: Self-pay | Admitting: Pulmonary Disease

## 2020-07-23 ENCOUNTER — Ambulatory Visit: Payer: Medicare HMO | Admitting: Pulmonary Disease

## 2020-07-23 VITALS — BP 118/62 | HR 71 | Temp 97.3°F | Ht 75.0 in | Wt 302.0 lb

## 2020-07-23 DIAGNOSIS — Z9189 Other specified personal risk factors, not elsewhere classified: Secondary | ICD-10-CM

## 2020-07-23 DIAGNOSIS — R0609 Other forms of dyspnea: Secondary | ICD-10-CM

## 2020-07-23 DIAGNOSIS — R0600 Dyspnea, unspecified: Secondary | ICD-10-CM | POA: Diagnosis not present

## 2020-07-23 DIAGNOSIS — J849 Interstitial pulmonary disease, unspecified: Secondary | ICD-10-CM | POA: Diagnosis not present

## 2020-07-23 DIAGNOSIS — R918 Other nonspecific abnormal finding of lung field: Secondary | ICD-10-CM | POA: Diagnosis not present

## 2020-07-23 DIAGNOSIS — R053 Chronic cough: Secondary | ICD-10-CM

## 2020-07-23 DIAGNOSIS — Z0000 Encounter for general adult medical examination without abnormal findings: Secondary | ICD-10-CM

## 2020-07-23 NOTE — Assessment & Plan Note (Signed)
High-resolution CT chest showing interstitial lung disease Reviewed high-resolution CT chest with patient Reviewed pulmonary function test with patient Discussed exposure history No family history of chronic lung disease the patient is aware of  Plan: Lab work today We will complete spirometry with DLCO in January/2022 We will plan on repeating high-resolution CT chest in November/2022 Complete ILD questionnaire Close follow-up with our office with Dr. Chase Caller in a 30-minute time slot Consider walk at that office visit May need to consider referral to pulmonary rehab at next office visit

## 2020-07-23 NOTE — Assessment & Plan Note (Signed)
Chronic cough is likely multifactorial given the fact that patient has a interstitial lung disease on high-resolution CT imaging.  Patient also with significant postnasal drip and rhinorrhea on exam today  Plan: Start daily antihistamine Can continue to use fluticasone/Flonase nasal medication See ILD assessment and plan for additional details regarding that work-up

## 2020-07-23 NOTE — Assessment & Plan Note (Signed)
ILD and small pulmonary nodules seen on high-resolution CT chest  Plan: We will repeat high-resolution CT chest in 1 year November/2022 ILD questionnaire provided today Lab work today Close follow-up with our office to review

## 2020-07-23 NOTE — Assessment & Plan Note (Signed)
Plan: Complete home sleep study as planned by cardiology later this month

## 2020-07-23 NOTE — Progress Notes (Signed)
_0  ID: Marcus [name], male    DOB: May 14, 1941, 79 y.o.   MRN: [id number]  Chief Complaint  Patient presents with  . Follow-up    DOE, Chronic cough    Referring provider: Hulan Fess, MD  HPI:   PMH:  Smoker/ Smoking History:  Maintenance:   Pt of:   07/23/2020  - Visit   79 year old male former smoker followed in our office for dyspnea on exertion and chronic cough.  He is established with Dr. Chase Caller.  He was seen for an initial consult in September/2021.  Plan of care from that consult with Dr. Chase Caller was as follows: Obtain high-resolution CT chest, do simple walk test, CBC with differential, IgE.  Stop bang score 6.  Was referred to Dr. Radford Pax for sleep study.  Patient presenting today as follow-up.  Patient reporting that he has been scheduled for home sleep study with cardiology on 08/11/2020.  Insurance would not pay for an in lab study.  Patient denies any sort of family history of lung disease.  He does report potential exposures when he was in the service for 2 years 1 of those years being in [place].  He reports a previous history of a primary care provider reporting that when he returned he may have had malaria and he was on malaria medications.  He is unsure of the validity of this.  Patient also worked in copper rod production for around 3 years.  He was around acid tanks for around 1 to 2 years during the rod washing.  He did not wear a mask.  He reports there is lots of smoke, copper dust as well as he to wear special close due to the exposures of the acid.  Patient is also currently dealing with an HVAC issue.  There is concerned there may be mold in this.  Both him and his wife have had respiratory symptoms of increased cough when the machine was running.  Is currently turned off and is being prepared to be cleaned.  He reports that the initial inspection by the HVAC team felt that there was no mold present.  Patient denies any persistent issues of acid  reflux.  He does have a history of GERD and has a history of hiatal hernia.  He was treated with Nexium for around 6 to 8 weeks.  He reports he has been off this medication for many years.  He does not have breakthrough reflux symptoms or require Tums.  Patient continues to have aspects of shortness of breath occasionally.  Especially with physical exertion.  Patient also has an occasional cough.  Cough is more present in the morning when he is waking up.  Sometimes cough is productive.  He does have significant clear nasal drainage.  He is occasionally using a nasal medication that he believes is Flonase that his wife purchased over-the-counter.  Questionaires / Pulmonary Flowsheets:   ACT:  No flowsheet data found.  MMRC: No flowsheet data found.  Epworth:  No flowsheet data found.  Tests:   07/15/2020-CT chest high-res-mild basilar predominant subpleural fibrosis may be due to nonspecific interstitial pneumonitis or UIP, findings are indeterminate for UIP per consensus guidelines, pulmonary nodules measure up to 4 mm, noncontrast chest CT can be considered in 12 months the patient is high risk, aortic arthrosclerosis, emphysema  04/02/2020-pulmonary function test-FVC 3.82 (75% predicted), postbronchodilator ratio 83, postbronchodilator FEV1 3.20 (87% predicted), no bronchodilator response, mid flow reversibility, TLC 5.76 (71% predicted), DLCO 23.34 (81% predicted)  01/31/2020-echocardiogram-LV  ejection fraction 55 to 60%, right ventricular systolic function normal, grade 1 diastolic dysfunction  5/64/3329-JJO with differential-eosinophils relative 4.3, eosinophils absolute 0.4 06/09/2020-IgE-37   FENO:  No results found for: NITRICOXIDE  PFT: PFT Results Latest Ref Rng & Units 04/02/2020  FVC-Pre L 3.82  FVC-Predicted Pre % 75  FVC-Post L 3.85  FVC-Predicted Post % 76  Pre FEV1/FVC % % 80  Post FEV1/FCV % % 83  FEV1-Pre L 3.04  FEV1-Predicted Pre % 83  FEV1-Post L 3.20  DLCO  uncorrected ml/min/mmHg 23.34  DLCO UNC% % 81  DLCO corrected ml/min/mmHg 23.34  DLCO COR %Predicted % 81  DLVA Predicted % 103  TLC L 5.76  TLC % Predicted % 71  RV % Predicted % 72    WALK:  SIX MIN WALK 06/09/2020  Supplimental Oxygen during Test? (L/min) No  Tech Comments: walked at an average pace, some unsteadiness d/t neuropathy in calves/feet.  no stops for rest or symptoms.  mild sob and fatigue at the end of the walk.    Imaging: CT Chest High Resolution  Result Date: 07/15/2020 CLINICAL DATA:  Shortness of breath. Productive cough and shortness of breath exertion for approximately 1 year. EXAM: CT CHEST WITHOUT CONTRAST TECHNIQUE: Multidetector CT imaging of the chest was performed following the standard protocol without intravenous contrast. High resolution imaging of the lungs, as well as inspiratory and expiratory imaging, was performed. COMPARISON:  None. FINDINGS: Cardiovascular: Atherosclerotic calcification of the aorta, aortic valve and coronary arteries. Pulmonic trunk is enlarged. Heart is at the upper limits of normal in size to mildly enlarged. No pericardial effusion. Mediastinum/Nodes: No pathologically enlarged mediastinal or axillary lymph nodes. Hilar regions are difficult to evaluate without IV contrast. Esophagus is unremarkable. Lungs/Pleura: Mild basilar predominant subpleural reticulation, ground-glass and traction bronchiectasis/bronchiolectasis. Findings persist on prone imaging. No definitive honeycombing. No air trapping. 3 mm left upper lobe nodule (3/57). 4 mm peripheral left lower lobe nodule (3/106). Mild centrilobular emphysema. No pleural fluid. Airway is unremarkable. Upper Abdomen: Visualized portions of the liver, gallbladder, adrenal glands, kidneys, spleen, pancreas, stomach and bowel are grossly unremarkable. No upper abdominal adenopathy. Musculoskeletal: Degenerative changes in the spine. IMPRESSION: 1. Mild basilar predominant subpleural fibrosis  may be due to nonspecific interstitial pneumonitis or usual interstitial pneumonitis. Findings are indeterminate for UIP per consensus guidelines: Diagnosis of Idiopathic Pulmonary Fibrosis: An Official ATS/ERS/JRS/ALAT Clinical Practice Guideline. [place], Iss 5, [phone], May 13 2017. 2. Pulmonary nodules measure up to 4 mm. No follow-up needed if patient is low-risk (and has no known or suspected primary neoplasm). Non-contrast chest CT can be considered in 12 months if patient is high-risk. This recommendation follows the consensus statement: Guidelines for Management of Incidental Pulmonary Nodules Detected on CT Images: From the Fleischner Society 2017; Radiology 2017; 284:228-243. 3. Aortic atherosclerosis (ICD10-I70.0). Coronary artery calcification. 4. Enlarged pulmonic trunk, indicative of pulmonary arterial hypertension. 5.  Emphysema (ICD10-J43.9). Electronically Signed   By: Lorin Picket M.D.   On: 07/15/2020 14:33    Lab Results:  CBC    Component Value Date/Time   WBC 9.3 06/09/2020 1505   RBC 4.06 (L) 06/09/2020 1505   HGB 13.6 06/09/2020 1505   HGB 14.0 01/28/2020 1201   HCT 40.9 06/09/2020 1505   HCT 42.3 01/28/2020 1201   PLT 184.0 06/09/2020 1505   PLT 184 01/28/2020 1201   MCV 100.9 (H) 06/09/2020 1505   MCV 100 (H) 01/28/2020 1201   MCH 33.1 (H)  01/28/2020 1201   MCHC 33.1 06/09/2020 1505   RDW 14.2 06/09/2020 1505   RDW 13.1 01/28/2020 1201   LYMPHSABS 1.9 06/09/2020 1505   MONOABS 1.0 06/09/2020 1505   EOSABS 0.4 06/09/2020 1505   BASOSABS 0.1 06/09/2020 1505    BMET    Component Value Date/Time   NA 138 01/29/2020 1011   K 4.7 01/29/2020 1011   CL 105 01/29/2020 1011   CO2 25 01/29/2020 1011   GLUCOSE 145 (H) 01/29/2020 1011   BUN 37 (H) 01/29/2020 1011   CREATININE 1.58 (H) 01/29/2020 1011   CALCIUM 9.5 01/29/2020 1011   GFRNONAA 42 (L) 01/29/2020 1011   GFRAA 48 (L) 01/29/2020 1011    BNP No results found for:  BNP  ProBNP    Component Value Date/Time   PROBNP 76 01/28/2020 1201    Specialty Problems      Pulmonary Problems   Chronic cough   DOE (dyspnea on exertion)   ILD (interstitial lung disease) (HCC)      No Known Allergies  Immunization History  Administered Date(s) Administered  . Influenza, High Dose Seasonal PF 06/06/2019  . PFIZER SARS-COV-2 Vaccination 09/28/2019, 10/19/2019, 06/08/2020    Past Medical History:  Diagnosis Date  . Ascending aorta dilatation (HCC)    80m gy echo 01/2020  . Benign essential HTN 09/09/2013  . Coronary artery disease 08/2013   S/P PCI of the LAD  . Dyslipidemia   . Elevated fasting glucose   . Fatty liver   . Glaucoma   . Gout   . Hyperlipidemia   . Hypertension   . Joint pain   . LAD (lymphadenopathy)   . Obesity   . Pure hypercholesterolemia 09/09/2013  . PVD (peripheral vascular disease) ([place] 06/12/2018    30-49% left common femoral stenosis  . Vision abnormalities     Tobacco History: Social History   Tobacco Use  Smoking Status Former Smoker  . Years: 10.00  . Types: Cigars  . Quit date: 02/07/2020  . Years since quitting: 0.4  Smokeless Tobacco Never Used  Tobacco Comment   3-4 cigars daily.  off and on for 10 years.  Stopped smoking cigarettes at 23.  smoked from age [age], 2 packs per day   Counseling given: Not Answered Comment: 3-4 cigars daily.  off and on for 10 years.  Stopped smoking cigarettes at 23.  smoked from age [age], 2 packs per day   Continue to not smoke  Outpatient Encounter Medications as of 07/23/2020  Medication Sig  . allopurinol (ZYLOPRIM) 300 MG tablet Take 300 mg by mouth daily.  .Marland Kitchenaspirin 81 MG tablet Take 81 mg by mouth daily.  .Marland Kitchenatorvastatin (LIPITOR) 80 MG tablet Take 1 tablet (80 mg total) by mouth daily.  . dorzolamide-timolol (COSOPT) 22.3-6.8 MG/ML ophthalmic solution Place 1 drop into both eyes 2 (two) times daily.  .Marland Kitchenlatanoprost (XALATAN) 0.005 % ophthalmic solution  Place 1 drop into both eyes daily.  .Marland Kitchenlosartan (COZAAR) 100 MG tablet Take 1 tablet (100 mg total) by mouth daily.  . metoprolol succinate (TOPROL-XL) 100 MG 24 hr tablet TAKE 1 TABLET DAILY WITH ORIMMEDIATELY FOLLOWING A    MEAL  . minocycline (MINOCIN,DYNACIN) 100 MG capsule Take 100 mg by mouth as needed. For skin acne  . nitroGLYCERIN (NITROSTAT) 0.4 MG SL tablet Place 0.4 mg under the tongue every 5 (five) minutes as needed for chest pain.   . Omega-3 Fatty Acids (FISH OIL) 1000 MG CAPS 2 capsules (20055m  by mouth two times a day  . pimecrolimus (ELIDEL) 1 % cream APPLY TO AFFECTED AREA ON FACE TWICE A DAY AS NEEDED  . tamsulosin (FLOMAX) 0.4 MG CAPS capsule Take 0.4 mg by mouth daily.   Facility-Administered Encounter Medications as of 07/23/2020  Medication  . technetium tetrofosmin (TC-MYOVIEW) injection 96.2 millicurie     Review of Systems  Review of Systems  Constitutional: Negative for activity change, chills, fatigue, fever and unexpected weight change.  HENT: Positive for congestion, postnasal drip and rhinorrhea. Negative for sinus pressure, sinus pain and sore throat.   Eyes: Negative.   Respiratory: Positive for cough and shortness of breath. Negative for wheezing.   Cardiovascular: Negative for chest pain and palpitations.  Gastrointestinal: Negative for constipation, diarrhea, nausea and vomiting.  Endocrine: Negative.   Genitourinary: Negative.   Musculoskeletal: Negative.   Skin: Negative.   Neurological: Negative for dizziness and headaches.  Psychiatric/Behavioral: Negative.  Negative for dysphoric mood. The patient is not nervous/anxious.   All other systems reviewed and are negative.    Physical Exam  BP 118/62 (BP Location: Left Arm, Cuff Size: Normal)   Pulse 71   Temp (!) 97.3 F (36.3 C) (Oral)   Ht _0  (1.905 m)   Wt (!) 309 lb 9.6 oz (140.4 kg)   SpO2 98%   BMI 38.70 kg/m   Wt Readings from Last 5 Encounters:  07/23/20 (!) 309 lb 9.6 oz  (140.4 kg)  06/09/20 296 lb 6.4 oz (134.4 kg)  02/13/20 291 lb (132 kg)  01/28/20 291 lb 12.8 oz (132.4 kg)  06/18/19 290 lb (131.5 kg)    BMI Readings from Last 5 Encounters:  07/23/20 38.70 kg/m  06/09/20 37.05 kg/m  02/13/20 36.37 kg/m  01/28/20 36.47 kg/m  06/18/19 36.25 kg/m     Physical Exam Vitals and nursing note reviewed.  Constitutional:      General: He is not in acute distress.    Appearance: Normal appearance. He is obese.  HENT:     Head: Normocephalic and atraumatic.     Right Ear: Hearing, tympanic membrane, ear canal and external ear normal. There is impacted cerumen (100%).     Left Ear: Hearing, tympanic membrane, ear canal and external ear normal. There is impacted cerumen (90%).     Nose: Rhinorrhea present. No mucosal edema.     Right Turbinates: Not enlarged.     Left Turbinates: Not enlarged.     Mouth/Throat:     Mouth: Mucous membranes are dry.     Pharynx: Oropharynx is clear. No oropharyngeal exudate.     Comments: +PND Eyes:     Pupils: Pupils are equal, round, and reactive to light.  Cardiovascular:     Rate and Rhythm: Normal rate and regular rhythm.     Pulses: Normal pulses.     Heart sounds: Normal heart sounds. No murmur heard.   Pulmonary:     Effort: Pulmonary effort is normal.     Breath sounds: Normal breath sounds. No decreased breath sounds, wheezing or rales.  Musculoskeletal:     Cervical back: Normal range of motion.     Right lower leg: No edema.     Left lower leg: No edema.  Lymphadenopathy:     Cervical: No cervical adenopathy.  Skin:    General: Skin is warm and dry.     Capillary Refill: Capillary refill takes less than 2 seconds.     Findings: No erythema or rash.  Neurological:  General: No focal deficit present.     Mental Status: He is alert and oriented to person, place, and time.     Motor: No weakness.     Coordination: Coordination normal.     Gait: Gait is intact. Gait normal.  Psychiatric:         Mood and Affect: Mood normal.        Behavior: Behavior normal. Behavior is cooperative.        Thought Content: Thought content normal.        Judgment: Judgment normal.       Assessment & Plan:   ILD (interstitial lung disease) ([place]) High-resolution CT chest showing interstitial lung disease Reviewed high-resolution CT chest with patient Reviewed pulmonary function test with patient Discussed exposure history No family history of chronic lung disease the patient is aware of  Plan: Lab work today We will complete spirometry with DLCO in January/2022 We will plan on repeating high-resolution CT chest in November/2022 Complete ILD questionnaire Close follow-up with our office with Dr. Chase Caller in a 30-minute time slot Consider walk at that office visit May need to consider referral to pulmonary rehab at next office visit   Abnormal findings on diagnostic imaging of lung ILD and small pulmonary nodules seen on high-resolution CT chest  Plan: We will repeat high-resolution CT chest in 1 year November/2022 ILD questionnaire provided today Lab work today Close follow-up with our office to review  At risk for obstructive sleep apnea Plan: Complete home sleep study as planned by cardiology later this month  Chronic cough Chronic cough is likely multifactorial given the fact that patient has a interstitial lung disease on high-resolution CT imaging.  Patient also with significant postnasal drip and rhinorrhea on exam today  Plan: Start daily antihistamine Can continue to use fluticasone/Flonase nasal medication See ILD assessment and plan for additional details regarding that work-up  DOE (dyspnea on exertion) Likely multiple contributing factors given patient's physical deconditioning, obesity, interstitial lung disease seen on high-resolution CT chest, potential untreated obstructive sleep apnea  Healthcare maintenance Plan: May need to consider referral to  pulmonary rehab We will request records from primary care's office for vaccinations Patient up-to-date with COVID-19 vaccinations    Return in about 6 weeks (around 09/03/2020), or if symptoms worsen or fail to improve, for Follow up with Dr. Purnell Shoemaker, ILD clinic - 97mn slot.   BLauraine Rinne NP 07/23/2020   This appointment required 52 minutes of patient care (this includes precharting, chart review, review of results, face-to-face care, etc.).

## 2020-07-23 NOTE — Assessment & Plan Note (Signed)
Plan: May need to consider referral to pulmonary rehab We will request records from primary care's office for vaccinations Patient up-to-date with COVID-19 vaccinations

## 2020-07-23 NOTE — Patient Instructions (Addendum)
You were seen today by Lauraine Rinne, NP  for:   1. ILD (interstitial lung disease) ([place])  - CT Chest High Resolution; Future - Pulmonary function test; Future  Complete lab work today  Complete ILD questionnaire as discussed today, please bring this to your next office visit  We will complete a high-resolution CT of your chest in November/2022  We will complete a 30-minute breathing test for you in Jan/2022  2. Abnormal findings on diagnostic imaging of lung  - CT Chest High Resolution; Future  3. DOE (dyspnea on exertion)  - Pulmonary function test; Future  4. Chronic cough  Please start taking a daily antihistamine:  >>>choose one of: zyrtec, claritin, allegra, or xyzal  >>>these are over the counter medications  >>>can choose generic option  >>>take daily  >>>this medication helps with allergies, post nasal drip, and cough   Okay to continue to use fluticasone/Flonase nasal medication as needed for nasal congestion, postnasal drip or allergic rhinitis symptoms  5. Healthcare maintenance  Would recommend continue to follow-up with primary care  Based off the cerumen buildup in your ear canals would recommend following up with primary care to see if they can flush them  Can also consider starting Debrox over-the-counter eardrops   We recommend today:  Orders Placed This Encounter  Procedures  . CT Chest High Resolution    -High-res CT with supine and prone positioning -inspiratory and expiratory cuts.  -Only to be read by Dr. Rosario Jacks and Dr. Weber Cooks.    Standing Status:   Future    Standing Expiration Date:   07/23/2021    Scheduling Instructions:     07/2021    Order Specific Question:   Preferred imaging location?    Answer:   [place]  . Pulmonary function test    Arlyce Harman with [facility]    Standing Status:   Future    Standing Expiration Date:   07/23/2021    Scheduling Instructions:     March/2022    Order Specific Question:   Where should  this test be performed?    Answer:   [facility]   Orders Placed This Encounter  Procedures  . CT Chest High Resolution  . Pulmonary function test   No orders of the defined types were placed in this encounter.   Follow Up:    Return in about 6 weeks (around 09/03/2020), or if symptoms worsen or fail to improve, for Follow up with Dr. Purnell Shoemaker, ILD clinic - 72mn slot.   Notification of test results are managed in the following manner: If there are  any recommendations or changes to the  plan of care discussed in office today,  we will contact you and let you know what they are. If you do not hear from [facility] then your results are normal and you can view them through your  MyChart account , or a letter will be sent to you. Thank you again for trusting [facility] your care  - Thank you, [facility]    It is flu season:   >>> Best ways to protect herself from the flu: Receive the yearly flu vaccine, practice good hand hygiene washing with soap and also using hand sanitizer when available, eat a nutritious meals, get adequate rest, hydrate appropriately       Please contact the office if your symptoms worsen or you have concerns that you are not improving.   Thank you for choosing [facility] for  your healthcare, and for allowing [place] to partner with you on your healthcare journey. I am thankful to be able to provide care to you today.   Wyn Quaker FNP-C

## 2020-07-23 NOTE — Assessment & Plan Note (Signed)
Likely multiple contributing factors given patient's physical deconditioning, obesity, interstitial lung disease seen on high-resolution CT chest, potential untreated obstructive sleep apnea

## 2020-07-24 DIAGNOSIS — H6123 Impacted cerumen, bilateral: Secondary | ICD-10-CM | POA: Diagnosis not present

## 2020-07-24 DIAGNOSIS — H6983 Other specified disorders of Eustachian tube, bilateral: Secondary | ICD-10-CM | POA: Diagnosis not present

## 2020-07-27 DIAGNOSIS — R69 Illness, unspecified: Secondary | ICD-10-CM | POA: Diagnosis not present

## 2020-07-27 LAB — HYPERSENSITIVITY PNEUMONITIS
A. Pullulans Abs: NEGATIVE
A.Fumigatus #1 Abs: NEGATIVE
Micropolyspora faeni, IgG: NEGATIVE
Pigeon Serum Abs: NEGATIVE
Thermoact. Saccharii: NEGATIVE
Thermoactinomyces vulgaris, IgG: NEGATIVE

## 2020-07-27 LAB — ANTI-SCLERODERMA ANTIBODY: Scleroderma (Scl-70) (ENA) Antibody, IgG: <1 AI

## 2020-07-27 LAB — ANA,IFA RA DIAG PNL W/RFLX TIT/PATN
Anti Nuclear Antibody (ANA): POSITIVE — AB
Cyclic Citrullin Peptide Ab: <16 UNITS
Rheumatoid fact SerPl-aCnc: <14 IU/mL (ref <14)

## 2020-07-27 LAB — ANCA SCREEN W REFLEX TITER: ANCA Screen: NEGATIVE

## 2020-07-27 LAB — SJOGREN'S SYNDROME ANTIBODS(SSA + SSB)
SSA (Ro) (ENA) Antibody, IgG: <1 AI
SSB (La) (ENA) Antibody, IgG: <1 AI

## 2020-07-27 LAB — ANTI-NUCLEAR AB-TITER (ANA TITER): ANA Titer 1: 1:40 {titer} — ABNORMAL HIGH

## 2020-08-05 DIAGNOSIS — H401113 Primary open-angle glaucoma, right eye, severe stage: Secondary | ICD-10-CM | POA: Diagnosis not present

## 2020-08-05 DIAGNOSIS — H401123 Primary open-angle glaucoma, left eye, severe stage: Secondary | ICD-10-CM | POA: Diagnosis not present

## 2020-08-05 LAB — MYOMARKER 3 PLUS PROFILE (RDL)

## 2020-08-06 NOTE — Progress Notes (Signed)
Seeing him 12/14. CT results given already by Wyn Quaker

## 2020-08-11 ENCOUNTER — Ambulatory Visit (HOSPITAL_BASED_OUTPATIENT_CLINIC_OR_DEPARTMENT_OTHER): Payer: Medicare HMO | Attending: Cardiology | Admitting: Cardiology

## 2020-08-11 ENCOUNTER — Other Ambulatory Visit: Payer: Self-pay

## 2020-08-11 DIAGNOSIS — G4733 Obstructive sleep apnea (adult) (pediatric): Secondary | ICD-10-CM | POA: Diagnosis not present

## 2020-08-14 NOTE — Procedures (Signed)
Patient Name: Irwin, Toran Study Date: 08/11/2020 Gender: Male D.O.B: [birth year] Age (years): 77 Referring Provider: Fransico Him MD, ABSM Height (inches): 75 Interpreting Physician: Fransico Him MD, ABSM Weight (lbs): 302 RPSGT: Jacolyn Reedy BMI: 38 MRN: [id number] Neck Size: 19.50  CLINICAL INFORMATION Sleep Study Type: HST  Indication for sleep study: N/A  Epworth Sleepiness Score: 5  SLEEP STUDY TECHNIQUE A multi-channel overnight portable sleep study was performed. The channels recorded were: nasal airflow, thoracic respiratory movement, and oxygen saturation with a pulse oximetry. Snoring was also monitored.  MEDICATIONS Patient self administered medications include: N/A.  SLEEP ARCHITECTURE Patient was studied for 418.5 minutes. The sleep efficiency was 100.0 % and the patient was supine for 0%. The arousal index was 0.0 per hour.  RESPIRATORY PARAMETERS The overall AHI was 7.6 per hour, with a central apnea index of 0.0 per hour.  The oxygen nadir was 89% during sleep.  CARDIAC DATA Mean heart rate during sleep was 67.7 bpm.  IMPRESSIONS - Mild obstructive sleep apnea occurred during this study (AHI = 7.6/h). - No significant central sleep apnea occurred during this study (CAI = 0.0/h). - Mild oxygen desaturation was noted during this study (Min O2 = 89%). - Patient snored 1.3% during the sleep.  DIAGNOSIS - Obstructive Sleep Apnea (G47.33)  RECOMMENDATIONS - Therapeutic CPAP titration to determine optimal pressure required to alleviate sleep disordered breathing. - Avoid alcohol, sedatives and other CNS depressants that may worsen sleep apnea and disrupt normal sleep architecture. - Sleep hygiene should be reviewed to assess factors that may improve sleep quality. - Weight management and regular exercise should be initiated or continued.  [Electronically signed] 08/14/2020 07:49 PM  Fransico Him MD, ABSM Diplomate, American Board of Sleep  Medicine

## 2020-08-15 ENCOUNTER — Telehealth: Payer: Self-pay | Admitting: *Deleted

## 2020-08-15 DIAGNOSIS — G4733 Obstructive sleep apnea (adult) (pediatric): Secondary | ICD-10-CM

## 2020-08-15 NOTE — Telephone Encounter (Signed)
-----  Message from Sueanne Margarita, MD sent at 08/14/2020  7:51 PM EST ----- Please let patient know that they have sleep apnea and recommend CPAP titration. Please set up titration in the sleep lab.

## 2020-08-15 NOTE — Telephone Encounter (Signed)
Informed patient of sleep study results and patient understanding was verbalized. Patient understands her sleep study showed they have sleep apnea and recommend CPAP titration. Please set up titration in the sleep lab.    Titration sent to sleep pool

## 2020-08-20 ENCOUNTER — Telehealth: Payer: Self-pay | Admitting: Cardiology

## 2020-08-20 NOTE — Telephone Encounter (Addendum)
08/20/20 2:42pm Auth submitted through [place]; CASE NUMBER [id number]   Auth approved; AUTH # [id number] VALID THROUGH 08/28/20 - 02/24/21

## 2020-08-25 ENCOUNTER — Encounter: Payer: Self-pay | Admitting: Internal Medicine

## 2020-08-25 ENCOUNTER — Ambulatory Visit: Payer: Medicare HMO | Admitting: Internal Medicine

## 2020-08-25 ENCOUNTER — Telehealth: Payer: Self-pay | Admitting: Internal Medicine

## 2020-08-25 ENCOUNTER — Other Ambulatory Visit: Payer: Self-pay

## 2020-08-25 VITALS — BP 122/64 | HR 64 | Temp 97.3°F | Ht 75.0 in | Wt 299.6 lb

## 2020-08-25 DIAGNOSIS — J849 Interstitial pulmonary disease, unspecified: Secondary | ICD-10-CM | POA: Diagnosis not present

## 2020-08-25 DIAGNOSIS — J392 Other diseases of pharynx: Secondary | ICD-10-CM

## 2020-08-25 DIAGNOSIS — Z9189 Other specified personal risk factors, not elsewhere classified: Secondary | ICD-10-CM

## 2020-08-25 NOTE — Telephone Encounter (Addendum)
Marcus [name]  D/w [name] -> start pirfenidone per protocol. He has IPF. Gave him dx 6:10 PM 08/25/2020.  SEt up pharmacy appt for counseling and start -> esbriet     SIGNATURE    Dr. Brand Males, M.D., F.C.C.P,  Pulmonary and Critical Care Medicine Staff Physician, [place] Director - Interstitial Lung Disease  Program  P[facility] [place] [facility] [place], [place], [address]  Pager: [phone], If no answer  OR between  19:00-7:00h: page [phone] Telephone (clinical office): [phone] Telephone (research): [phone]  6:10 PM 08/25/2020

## 2020-08-25 NOTE — Patient Instructions (Addendum)
ILD (interstitial lung disease) ([place])  -Definitively present in the lung base  -We had an extensive discussion about the different types of interstitial lung disease.  My clinical suspicion is you have IPF based on age greater than 13, male gender, Caucasian ethnicity, negative serology profile, history of acid reflux, previous exposure to smoke and copper and prior history of smoking  -However, the CT scan does not fit a pattern of high confidence that you have IPF  Plan - stop fish oil - can make Acid reflux worse which is a risk factor for fibrosis -Please drop of the ILD questionnaire today -I will get a second opinion on your CT scan of the chest -Based on the above to if there is   -High confidence you have IPF: We will initiate pirfenidone therapy [because of your heart disease we will reserve nintedanib a second line]  - -We discussed pirfenidone in detail  -If after questionnaire review and second opinion on CT scan- still lack of high confidence that you have IPF then we will proceed with biopsy   -We discussed 2 methods of biopsy including surgical lung biopsy and bronchoscopy method with lavage and mosquito  size biopsy pieces call transbronchial biopsy pieces for RNA genomic analysis.  Discussed the limitations, advantages and disadvantages and risks and benefits of both approaches.  We took a shared decision making to first go with the bronchoscopy method and if nondiagnostic proceed with surgical biopsy  -   Lesion of throat   -Persistent redness in the back of the hard palate  Plan - `refer ENT Dr Elgie Congo  At risk for obstructive sleep apnea  -Take the printout of the sleep study from my CMA.  Please contact Dr. Radford Pax for further details  Follow-up -I will call you with an update once I hear from radiology and review your questionnaire -Ensure your pulmonary function testing in January 2022 -30-minute office visit in February 2022

## 2020-08-25 NOTE — Progress Notes (Signed)
OV 06/09/2020  Subjective:  Patient ID: Marcus [name], male , DOB: 03-11-1941 , age 79 y.o. , MRN: [id number] , ADDRESS: [address] [place] [address]  PCP Hulan Fess, MD Cardiologist Dr. Golden Hurter   06/09/2020 -   Chief Complaint  Patient presents with  . Consult    sob for the last year, worse for the last 6 month.     HPI Marcus [name] 79 y.o. -79 year old male with a history of atherosclerotic coronary artery disease [status post remote PCI of LAD], hypertension and hyperlipidemia.  History of transaminitis that resolved after stopping ethanol and Tylenol.  Lower extremity Dopplers in 2017 30-49% left common femoral arterial stenosis.  He saw Dr. Radford Pax cardiology in May 2021 with new onset of shortness of breath that is progressive.  Associated with exertional chest pressure.  At baseline he goes to [place] a lot and walks 3 miles [place] time on the beach but as of May 2021 he was only able to walk 5 feet and had to stop halfway through that walk.  Also notices walking up stairs relieved by rest.  Overall dyspnea on exertion for 1 year.  Worse in the last 6 months but he also says that he some better in the last few months.  Denies any proximal nocturnal dyspnea orthopnea or lower extremity edema dizziness or palpitations or syncope.  Because of this he underwent nuclear medicine cardiac stress test February 14, 2020 that was low risk with ejection fraction 65%.  But he has had persistent symptoms and therefore has been referred here.  He then had pulmonary function test April 02, 2020 that shows mild restriction with FVC 3.82 L / 75% total lung capacity 5.76/71% but normal DLCO.  But because of persistent symptoms has been referred here.  Of note his echo showed grade 1 diastolic dysfunction.  Of note he also has a chronic cough during the same time.  -He tells me that recently his [facility] duct died out and after that the cough improved when the [facility] duct was off.  He is now  going to get his [facility] duct evaluated.    He also reports 10 pound weight gain during this time.  Normally is around 290 pounds.  Now he is at 300 pounds.  He is also been more sedentary because of the pandemic.  He says during this time his wife has noticed that he occasionally snores.  He does fall easily fatigued during the daytime.  He also has disrupted sleep pattern.  I did a stop bang questionnaire and it is 6 out of 8 suggesting significantly elevated risk for sleep apnea.  He has never had a sleep study.`  He is going back to [place], [place] where he owns another home.  He will not be able to do test in the next few weeks.   Results for Marcus [name], Marcus [name] (MRN [id number]) as of 06/09/2020 14:16  Ref. Range 03/23/2017 09:13  Please Note (HCV): Unknown Comment  Anti Nuclear Antibody (ANA) Latest Ref Range: Negative  Negative  ANCA Proteinase 3 Latest Ref Range: 0.0 - 3.5 U/mL <3.5  Atypical pANCA Latest Ref Range: Neg:<1:20 titer <1:20  ENA SSA (RO) Ab Latest Ref Range: 0.0 - 0.9 AI <0.2  ENA SSB (LA) Ab Latest Ref Range: 0.0 - 0.9 AI <0.2  Deamidated Gliadin Abs, IgG Latest Ref Range: 0 - 19 units 2  Myeloperoxidase Ab Latest Ref Range: 0.0 - 9.0 U/mL <9.0  RA Latex  Turbid. Latest Ref Range: 0.0 - 13.9 IU/mL <10.0  Transglutaminase IgA Latest Ref Range: 0 - 3 U/mL <2  Cytoplasmic (C-ANCA) Latest Ref Range: Neg:<1:20 titer <1:20  P-ANCA Latest Ref Range: Neg:<1:20 titer <1:20  Antigliadin Abs, IgA Latest Ref Range: 0 - 19 units 6  IgG (Immunoglobin G), Serum Latest Ref Range: 700 - 1,600 mg/dL 825  IgM (Immunoglobulin M), Srm Latest Ref Range: 15 - 143 mg/dL 126  IgA/Immunoglobulin A, Serum Latest Ref Range: 61 - 437 mg/dL 312  Lyme IgG/IgM Ab Latest Ref Range: 0.00 - 0.90 ISR <0.91    NP visit Nov 2021   07/23/2020  - Visit   79 year old male former smoker followed in our office for dyspnea on exertion and chronic cough.  He is established with Dr. Chase Caller.  He was seen for an  initial consult in September/2021.  Plan of care from that consult with Dr. Chase Caller was as follows: Obtain high-resolution CT chest, do simple walk test, CBC with differential, IgE.  Stop bang score 6.  Was referred to Dr. Radford Pax for sleep study.  Patient presenting today as follow-up.  Patient reporting that he has been scheduled for home sleep study with cardiology on 08/11/2020.  Insurance would not pay for an in lab study.  Patient denies any sort of family history of lung disease.  He does report potential exposures when he was in the service for 2 years 1 of those years being in [place].  He reports a previous history of a primary care provider reporting that when he returned he may have had malaria and he was on malaria medications.  He is unsure of the validity of this.  Patient also worked in copper rod production for around 3 years.  He was around acid tanks for around 1 to 2 years during the rod washing.  He did not wear a mask.  He reports there is lots of smoke, copper dust as well as he to wear special close due to the exposures of the acid.  Patient is also currently dealing with an HVAC issue.  There is concerned there may be mold in this.  Both him and his wife have had respiratory symptoms of increased cough when the machine was running.  Is currently turned off and is being prepared to be cleaned.  He reports that the initial inspection by the HVAC team felt that there was no mold present.  Patient denies any persistent issues of acid reflux.  He does have a history of GERD and has a history of hiatal hernia.  He was treated with Nexium for around 6 to 8 weeks.  He reports he has been off this medication for many years.  He does not have breakthrough reflux symptoms or require Tums.  Patient continues to have aspects of shortness of breath occasionally.  Especially with physical exertion.  Patient also has an occasional cough.  Cough is more present in the morning when he is waking up.   Sometimes cough is productive.  He does have significant clear nasal drainage.  He is occasionally using a nasal medication that he believes is Flonase that his wife purchased over-the-counter.   OV 08/25/2020   Subjective:  Patient ID: Marcus [name], male , DOB: 06/12/1941, age 24 y.o. years. , MRN: [id number],  ADDRESS: [address] [place] [address] PCP  Hulan Fess, MD Providers : Treatment Team:  Attending Provider: Brand Males, MD Patient Care Team: Hulan Fess, MD as PCP - General (Family  Medicine) Sueanne Margarita, MD as PCP - Cardiology (Cardiology)    Chief Complaint  Patient presents with  . Follow-up    ILD, doing ok, occ cough with some mucous       HPI Marcus [name] 79 y.o. -returns for follow-up to discuss his test results.  He is here with his wife who I am meeting for the first time.  His high-resolution CT chest is interpreted as indeterminate for UIP.  I personally visualized this I personally thought it was probable UIP.  I took a second opinion from Dr. Carilyn Goodpasture thoracic radiologist who feels that patient could get classified as probable UIP on the CT scan.  Long discussion was held with the patient but he did not bring his ILD questionnaire.  He had to bring this after the visit.  Therefore parts of this dictation happened after his visit but on the same day.  His main concern is that he will die prematurely.  [facility] Comprehensive ILD Questionnaire  Symptoms: He tells me that he had insidious onset of shortness of breath for the last 12 months.  Since it started it is the same.  He has level 1 dyspnea for household work, shopping and walking at a level pace.  Level 2 dyspnea on walking up stairs.  He does have difficulty keeping up with others of his age.  He has neuropathy and hip soreness.  He also has a cough since March 2021.  It is the same since it started moderate in severity.  It is dry.  He does clear his throat and he  does feel a tickle in his throat.  There is no hemoptysis or sputum production.  No wheezing no nausea no vomiting no diarrhea.  The cough is mostly in the morning.  His symptoms do feel better when he stops activity for 5 minutes.  It makes him feel more short of breath when he does vigorous activity.  No associated symptoms.   Past Medical History : He has a history of coronary artery disease with 1 stent. He is undergoing a sleep apnea work-up.  He has chronic kidney disease stage III according to his history. Malaria in [place]. Polio as a kid   He does not have rheumatoid arthritis.  No scleroderma no lupus no polymyositis no Sjogren's.  No other vasculitis.  No thyroid disease no diabetes.  No stroke no seizures.  No hepatitis no pneumonia.  His creatinine in May 2021 was 1.58 mg percent with a GFR of 42-48.  In October 2019 his creatinine was 1.35 mg percent.   Of note he says his dental hygienist has noted persistent redness in his hard palate.  He is asking me to refer him to ENT specialist and I have obliged  ROS: He does have arthralgia related to gout.  He has seborrhea.  He denies any oral ulcers or heartburn or vomiting or nausea.  Denies any weight loss.  He has obesity.  No recurrent fever no dryness.  No fatigue   FAMILY HISTORY of LUNG DISEASE: No pulmonary fibrosis no COPD.  His sister has autoimmune disease Brother has asthma   EXPOSURE HISTORY: He smokes cigarettes between 1960 and 1966 2 packs.  He also smoked cigars for 20 years.  No passive smoking.  No electronic cigarettes no marijuana no vaping no cocaine no intravenous drug use. Was      HOME and HOBBY DETAILS : Single-family home [place] for the last 10 years.  Age of the home is 25 years.  It is a 5 bedroom house.  There is no dampness.  No mold or mildew no humidifier use no CPAP use no nebulizer use.  No steam iron use no Jacuzzi use no misting Fountain.  No pet birds or parakeets no pet  gerbils or hamsters.  No feather pillows no mold in the [facility] duct.  No music habits.  No gardening habits.  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  During this time he was exposed to gas fumes and chemicals he reports that sulfuric acid copper out washing.  Otherwise negative for organic and inorganic antigens.   PULMONARY TOXICITY HISTORY (27 items): Negative     SYMPTOM SCALE -  06/09/2020  08/25/2020   O2 use ra ra  Shortness of Breath 0 -> 5 scale with 5 being worst (score 6 If unable to do)   At rest 0 0  Simple tasks - showers, clothes change, eating, shaving 1 0  Household (dishes, doing bed, laundry) 2 1  Shopping 2 2  Walking level at own pace 3 2  Walking up Stairs 3 2  Total (30-36) Dyspnea Score 11 7  How bad is your cough? 2 4  How bad is your fatigue 4 3  How bad is nausea 0 0  How bad is vomiting?  00 0  How bad is diarrhea? 0 0  How bad is anxiety? 0 0  How bad is depression 0 0       Simple office walk 185 feet x  3 laps goal with forehead probe 06/09/2020  08/25/2020   O2 used ra   Number laps completed 3   Comments about pace nl 100% and 64/min  Resting Pulse Ox/HR 100% and 61/min   Final Pulse Ox/HR 97% and 98/min 98% and 80/min  Desaturated </= 88% no   Desaturated <= 3% points yes   Got Tachycardic >/= 90/min yes   Symptoms at end of test x   Miscellaneous comments x    PFT Results Latest Ref Rng & Units 04/02/2020  FVC-Pre L 3.82  FVC-Predicted Pre % 75  FVC-Post L 3.85  FVC-Predicted Post % 76  Pre FEV1/FVC % % 80  Post FEV1/FCV % % 83  FEV1-Pre L 3.04  FEV1-Predicted Pre % 83  FEV1-Post L 3.20  DLCO uncorrected ml/min/mmHg 23.34  DLCO UNC% % 81  DLCO corrected ml/min/mmHg 23.34  DLCO COR %Predicted % 81  DLVA Predicted % 103  TLC L 5.76  TLC % Predicted % 71  RV % Predicted % 72    Radiology below were read by Dr.  Lorin Picket is indeterminate for UIP.  My personal opinion this is probable UIP.  Dr. Carilyn Goodpasture radiologist can also concur with me it is probable UIP   Narrative & Impression  CLINICAL DATA:  Shortness of breath. Productive cough and shortness of breath exertion for approximately 1 year.  EXAM: CT CHEST WITHOUT CONTRAST  TECHNIQUE: Multidetector CT imaging of the chest was performed following the standard protocol without intravenous contrast. High resolution imaging of the lungs, as well as inspiratory and expiratory imaging, was performed.  COMPARISON:  None.  FINDINGS: Cardiovascular: Atherosclerotic calcification of the aorta, aortic valve and coronary arteries. Pulmonic trunk is enlarged. Heart is at the upper limits of normal in size to mildly enlarged. No  pericardial effusion.  Mediastinum/Nodes: No pathologically enlarged mediastinal or axillary lymph nodes. Hilar regions are difficult to evaluate without IV contrast. Esophagus is unremarkable.  Lungs/Pleura: Mild basilar predominant subpleural reticulation, ground-glass and traction bronchiectasis/bronchiolectasis. Findings persist on prone imaging. No definitive honeycombing. No air trapping. 3 mm left upper lobe nodule (3/57). 4 mm peripheral left lower lobe nodule (3/106). Mild centrilobular emphysema. No pleural fluid. Airway is unremarkable.  Upper Abdomen: Visualized portions of the liver, gallbladder, adrenal glands, kidneys, spleen, pancreas, stomach and bowel are grossly unremarkable. No upper abdominal adenopathy.  Musculoskeletal: Degenerative changes in the spine.  IMPRESSION: 1. Mild basilar predominant subpleural fibrosis may be due to nonspecific interstitial pneumonitis or usual interstitial pneumonitis. Findings are indeterminate for UIP per consensus guidelines: Diagnosis of Idiopathic Pulmonary Fibrosis: An Official ATS/ERS/JRS/ALAT Clinical Practice Guideline. [place], Iss 5, [phone], May 13 2017. 2. Pulmonary nodules measure up to 4 mm. No follow-up needed if patient is low-risk (and has no known or suspected primary neoplasm). Non-contrast chest CT can be considered in 12 months if patient is high-risk. This recommendation follows the consensus statement: Guidelines for Management of Incidental Pulmonary Nodules Detected on CT Images: From the Fleischner Society 2017; Radiology 2017; 284:228-243. 3. Aortic atherosclerosis (ICD10-I70.0). Coronary artery calcification. 4. Enlarged pulmonic trunk, indicative of pulmonary arterial hypertension. 5.  Emphysema (ICD10-J43.9).   Electronically Signed   By: Lorin Picket M.D.   On: 07/15/2020 14:33    Results for Marcus [name], Marcus [name] (MRN [id number]) as of 08/25/2020 10:28  Ref. Range 07/23/2020 09:56 07/23/2020 09:58  Anti Nuclear Antibody (ANA) Latest Ref Range: NEGATIVE  POSITIVE (A)   ANA Pattern 1 Unknown Nuclear, Homogeneous (A)   ANA Titer 1 Latest Units: titer 4:78 (H)   Cyclic Citrullin Peptide Ab Latest Units: UNITS <16   RA Latex Turbid. Latest Ref Range: <14 IU/mL <14   ANA,IFA RA DIAG PNL W/RFLX TIT/PATN Unknown Rpt (A)   Anti-Jo-1 Ab (RDL) Latest Ref Range: <20 Units  <20  Anti-PL-7 Ab (RDL) Latest Ref Range: Negative   Negative  Anti-PL-12 Ab (RDL) Latest Ref Range: Negative   Negative  Anti-EJ Ab (RDL) Latest Ref Range: Negative   Negative  Anti-OJ Ab (RDL) Latest Ref Range: Negative   Negative  Anti-SRP Ab (RDL) Latest Ref Range: Negative   Negative  Anti-Mi-2 Ab (RDL) Latest Ref Range: Negative   Negative  Anti-TIF-1gamma Ab (RDL) Latest Ref Range: <20 Units  <20  Anti-MDA-5 Ab (CADM-140)(RDL) Latest Ref Range: <20 Units  <20  Anti-NXP-2 (P140) Ab (RDL) Latest Ref Range: <20 Units  <20  Anti-SAE1 Ab, IgG (RDL) Latest Ref Range: <20 Units  <20  Anti-PM/Scl-100 Ab (RDL) Latest Ref Range: <20 Units  <20  Anti-Ku Ab (RDL) Latest Ref Range: Negative   Negative   Anti-SS-A 52kD Ab, IgG (RDL) Latest Ref Range: <20 Units  <20  Anti-U1 RNP Ab (RDL) Latest Ref Range: <20 Units  <20  Anti-U2 RNP Ab (RDL) Latest Ref Range: Negative   Negative  Anti-U3 RNP (Fibrillarin)(RDL) Latest Ref Range: Negative   Negative  SSA (Ro) (ENA) Antibody, IgG Latest Ref Range: <1.0 NEG AI <1.0 NEG   SSB (La) (ENA) Antibody, IgG Latest Ref Range: <1.0 NEG AI <1.0 NEG   Scleroderma (Scl-70) (ENA) Antibody, IgG Latest Ref Range: <1.0 NEG AI <1.0 NEG     has a past medical history of Ascending aorta dilatation (HCC), Benign essential HTN (09/09/2013), Coronary artery disease (08/2013), Dyslipidemia, Elevated fasting glucose, Fatty  liver, Glaucoma, Gout, Hyperlipidemia, Hypertension, Joint pain, LAD (lymphadenopathy), Obesity, Pure hypercholesterolemia (09/09/2013), PVD (peripheral vascular disease) ([place]) (06/12/2018), and Vision abnormalities.   reports that he quit smoking about 6 months ago. His smoking use included cigars. He quit after 10.00 years of use. He has never used smokeless tobacco.  Past Surgical History:  Procedure Laterality Date  . ADENOIDECTOMY    . CATARACT EXTRACTION    . CORONARY ANGIOPLASTY WITH STENT PLACEMENT  2009  . EYE SURGERY    . TONSILLECTOMY    . WISDOM TOOTH EXTRACTION      Allergies  Allergen Reactions  . Nsaids Other (See Comments)    Immunization History  Administered Date(s) Administered  . Influenza, High Dose Seasonal PF 06/06/2019  . PFIZER SARS-COV-2 Vaccination 09/28/2019, 10/19/2019, 06/08/2020  . Pneumococcal Conjugate-13 10/23/2014  . Pneumococcal Polysaccharide-23 08/27/2012  . Tdap 08/27/2012  . Zoster 08/29/2012, 09/28/2019, 03/11/2020    Family History  Problem Relation Age of Onset  . Breast cancer Mother   . Heart attack Father   . Rheumatic fever Father   . CAD Sister   . Multiple sclerosis Sister   . CAD Brother   . Neuropathy Neg Hx      Current Outpatient Medications:  .  allopurinol (ZYLOPRIM) 300  MG tablet, Take 300 mg by mouth daily., Disp: , Rfl:  .  aspirin 81 MG tablet, Take 81 mg by mouth daily., Disp: , Rfl:  .  atorvastatin (LIPITOR) 80 MG tablet, Take 1 tablet (80 mg total) by mouth daily., Disp: 90 tablet, Rfl: 3 .  dorzolamide-timolol (COSOPT) 22.3-6.8 MG/ML ophthalmic solution, Place 1 drop into both eyes 2 (two) times daily., Disp: , Rfl:  .  fluticasone (FLONASE) 50 MCG/ACT nasal spray, Place into the nose., Disp: , Rfl:  .  latanoprost (XALATAN) 0.005 % ophthalmic solution, Place 1 drop into both eyes daily., Disp: , Rfl:  .  loratadine (CLARITIN) 10 MG tablet, Take by mouth., Disp: , Rfl:  .  losartan (COZAAR) 100 MG tablet, Take 1 tablet (100 mg total) by mouth daily., Disp: 90 tablet, Rfl: 3 .  metoprolol succinate (TOPROL-XL) 100 MG 24 hr tablet, TAKE 1 TABLET DAILY WITH ORIMMEDIATELY FOLLOWING A    MEAL, Disp: 90 tablet, Rfl: 3 .  minocycline (MINOCIN,DYNACIN) 100 MG capsule, Take 100 mg by mouth as needed. For skin acne, Disp: , Rfl: 3 .  nitroGLYCERIN (NITROSTAT) 0.4 MG SL tablet, Place 0.4 mg under the tongue every 5 (five) minutes as needed for chest pain. , Disp: , Rfl:  .  Omega-3 Fatty Acids (FISH OIL) 1000 MG CAPS, 2 capsules (2026m) by mouth two times a day, Disp: , Rfl:  .  pimecrolimus (ELIDEL) 1 % cream, APPLY TO AFFECTED AREA ON FACE TWICE A DAY AS NEEDED, Disp: , Rfl:  .  tamsulosin (FLOMAX) 0.4 MG CAPS capsule, Take 0.4 mg by mouth daily., Disp: , Rfl:  No current facility-administered medications for this visit.  Facility-Administered Medications Ordered in Other Visits:  .  technetium tetrofosmin (TC-MYOVIEW) injection 340.1millicurie, 302.7millicurie, Intravenous, Once PRN, Turner, Traci R, MD      Objective:   Vitals:   08/25/20 1007  BP: 122/64  Pulse: 64  Temp: (!) 97.3 F (36.3 C)  TempSrc: Oral  SpO2: 100%  Weight: 299 lb 9.6 oz (135.9 kg)  Height: _0  (1.905 m)    Estimated body mass index is 37.45 kg/m as calculated from the  following:   Height as of this  encounter: _0  (1.905 m).   Weight as of this encounter: 299 lb 9.6 oz (135.9 kg).  _1 @  Filed Weights   08/25/20 1007  Weight: 299 lb 9.6 oz (135.9 kg)     Physical Exam  General: No distress. Obese.Mallmaptatt  4 Neuro: Alert and Oriented x 3. GCS 15. Speech normal Psych: Pleasant Resp:  Barrel Chest - no.  Wheeze - no, Crackles - no, No overt respiratory distress CVS: Normal heart sounds. Murmurs - no Ext: Stigmata of Connective Tissue Disease - no HEENT: Normal upper airway. PEERL +. No post nasal drip        Assessment:       ICD-10-CM   1. ILD (interstitial lung disease) (HCC)  J84.9 Pulmonary function test  2. Lesion of throat  J39.2 Ambulatory referral to ENT  3. At risk for obstructive sleep apnea  Z91.89        Plan:     Patient Instructions  ILD (interstitial lung disease) ([place])  -Definitively present in the lung base  -We had an extensive discussion about the different types of interstitial lung disease.  My clinical suspicion is you have IPF based on age greater than 75, male gender, Caucasian ethnicity, negative serology profile, history of acid reflux, previous exposure to smoke and copper and prior history of smoking  -However, the CT scan does not fit a pattern of high confidence that you have IPF  Plan - stop fish oil - can make Acid reflux worse which is a risk factor for fibrosis -Please drop of the ILD questionnaire today -I will get a second opinion on your CT scan of the chest -Based on the above to if there is   -High confidence you have IPF: We will initiate pirfenidone therapy [because of your heart disease we will reserve nintedanib a second line]  - -We discussed pirfenidone in detail  -If after questionnaire review and second opinion on CT scan- still lack of high confidence that you have IPF then we will proceed with biopsy   -We discussed 2 methods of biopsy including surgical lung biopsy  and bronchoscopy method with lavage and mosquito size biopsy pieces call transbronchial biopsy pieces for RNA genomic analysis.  Discussed the limitations, advantages and disadvantages and risks and benefits of both approaches.  We took a shared decision making to first [name] with the bronchoscopy method and if nondiagnostic proceed with surgical biopsy  -   Lesion of throat   -Persistent redness in the back of the hard palate  Plan - `refer ENT Dr Elgie Congo  At risk for obstructive sleep apnea  -Take the printout of the sleep study from my CMA.  Please contact Dr. Radford Pax for further details  Follow-up -I will call you with an update once I hear from radiology and review your questionnaire -Ensure your pulmonary function testing in January 2022 -30-minute office visit in February 2022   Addendum 6:01 PM -> based on the ILD questionnaire is exposure history is most consistent with IPF.  The CT scan is probable UIP in my view and Dr. Leonarda Salon can also concur.  Therefore given a diagnosis of IPF and start pirfenidone therapy as discussed above. I shared this diagnosis with patient 6:04 PM and answered several questions through 6:14 PM    ( Level 05 visit: Estb 40-54 min   in  visit type: on-site physical face to visit  in total care time and counseling or/and coordination of care by this undersigned MD - Dr  Deniah Saia. This includes one or more of the following on this same day 08/25/2020: pre-charting, chart review, note writing, documentation discussion of test results, diagnostic or treatment recommendations, prognosis, risks and benefits of management options, instructions, education, compliance or risk-factor reduction. It excludes time spent by the [place] or office staff in the care of the patient. Actual time 60  + 15 min)   SIGNATURE    Dr. Brand Males, M.D., F.C.C.P,  Pulmonary and Critical Care Medicine Staff Physician, [place] Director - Interstitial Lung  Disease  Program  Pulmonary [place] [facility] [place], [place], [address]  Pager: [phone], If no answer or between  15:00h - 7:00h: call [phone] Telephone: [phone]  6:01 PM 08/25/2020

## 2020-08-26 NOTE — Telephone Encounter (Signed)
Called and spoke with pt letting him know the info stated by MR and he verbalized understanding. appt has been scheduled for pt with pharmacy team. Nothing further needed.

## 2020-08-31 ENCOUNTER — Telehealth: Payer: Self-pay | Admitting: Pharmacy Technician

## 2020-08-31 ENCOUNTER — Ambulatory Visit: Payer: Medicare HMO | Admitting: Pharmacist

## 2020-08-31 ENCOUNTER — Other Ambulatory Visit: Payer: Self-pay

## 2020-08-31 DIAGNOSIS — J849 Interstitial pulmonary disease, unspecified: Secondary | ICD-10-CM

## 2020-08-31 NOTE — Telephone Encounter (Signed)
Will place [facility] Patient Assistance form in provider's box for signature.

## 2020-08-31 NOTE — Telephone Encounter (Signed)
Received notification from [facility] regarding a prior authorization for ESBRIET. Authorization has been APPROVED from 09/13/19 to 09/11/20.   Authorization # [id number]   Ran test claim for Esbriet loading dose, patient's copay is $2,440.24. Patient will need to complete patient assistance paperwork.

## 2020-08-31 NOTE — Progress Notes (Signed)
Patient counseled on purpose, proper use, and potential adverse effects including nausea, vomiting, abdominal pain, GERD, weight loss, arthralgia, dizziness, and suns sensitivity/rash.  Stressed the importance of routine lab monitoring. Will monitor LFT's every month for the first 6 months of treatment then every 3 months. Will monitor CBC every 3 months.  Starting dose will be Esbriet 267 mg 1 tablet three times daily for 7 days, then 2 tablets three times daily for 7 days, then 3 tablets three times daily.  Maintenance dose will be 801 mg 1 tablet three times daily if tolerated.  Stressed the importance of taking with meals to minimize stomach upset.    Patient prefers 90 day supplies since he visits [place] a lot. We discussed that we can sent the prescription for 90 days, but pharmacy associated with PAP may only dispense 30 days at a time. Discussed that it's best that he reach out to pharmacy to coordinate shipment to [facility] ahead of time if he knows he'll be visiting.  Patient portion of PAP application completed today with patient. PA approved through insurance but >$2000 per month.  Patient also would like all notes with Dr. Chase Caller sent to his PCP, Dr. Rex Kras, at [facility]. Routing to Dr. Alfonso Patten as Juluis Rainier.  Knox Saliva, PharmD, MPH Clinical Pharmacist (Rheumatology and Pulmonology)

## 2020-08-31 NOTE — Telephone Encounter (Signed)
Esbriet New start- have not received any new start paperwork  Submitted a Prior Authorization request to [place] for [place] via Cover My Meds. Will update once we receive a response.   ([employer]) - [id number]

## 2020-09-01 DIAGNOSIS — Z20822 Contact with and (suspected) exposure to covid-19: Secondary | ICD-10-CM | POA: Diagnosis not present

## 2020-09-07 NOTE — Telephone Encounter (Signed)
Faxed Signed [place] [employer] forms in. Will update once we receive a response.  MBWGY#[phone]

## 2020-09-14 MED ORDER — ESBRIET 267 MG PO TABS: 801.0000 mg | ORAL_TABLET | Freq: Three times a day (TID) | ORAL | 3 refills | Status: DC

## 2020-09-14 NOTE — Telephone Encounter (Signed)
Received notification from  [place] regarding an approval for [place] patient assistance from 09/14/20 to 09/15/23 (if there are no insurance changes).   Phone number: [phone]  Called patient and advised to be on the lookout for welcome call from [facility] and [facility] pharmacy to schedule shipment. Advised that pharmacy only ships 30 days at a time. Texted phone numbers and advised to save in phone book. Advised to reach out pulmonary clinic if he doesn't receive call from [place] this week.  Sent MyChart message with walk-in lab information and [facility] phone numbers.   Maintenance rx sent to [facility]. Standing lab orders placed for hepatic panel and CBC. Advised patient to have labs drawn every month for the first 6 months. He verbalized understanding.  Knox Saliva, PharmD, MPH Clinical Pharmacist (Rheumatology and Pulmonology)

## 2020-09-16 NOTE — Telephone Encounter (Signed)
OSA is mild so I am ok with that

## 2020-09-16 NOTE — Telephone Encounter (Signed)
Patient states he does not want CPAP Titration appt, states he does not want to have to wear a cpap. Appt cancelled.

## 2020-09-17 NOTE — Telephone Encounter (Signed)
Returned patient's call regarding Esbriet new start. First month's shipment is arriving Saturday, 09/19/20. We discussed that maintenance rx for 3 month supply was sent and [facility] confirmed that they are able to ship 3 months at a time. Advised him to schedule month 2 (and thereafter) shipment in a 1-2 weeks and confirm that pharmacy can ship 3 months supply.  Reiterated need for labs one month after starting then every month for 6 months. Already sent information about walk-in lab on [address].  He verbalized understanding.

## 2020-09-22 DIAGNOSIS — L570 Actinic keratosis: Secondary | ICD-10-CM | POA: Diagnosis not present

## 2020-09-22 DIAGNOSIS — B9689 Other specified bacterial agents as the cause of diseases classified elsewhere: Secondary | ICD-10-CM | POA: Diagnosis not present

## 2020-09-22 DIAGNOSIS — L820 Inflamed seborrheic keratosis: Secondary | ICD-10-CM | POA: Diagnosis not present

## 2020-09-22 DIAGNOSIS — L218 Other seborrheic dermatitis: Secondary | ICD-10-CM | POA: Diagnosis not present

## 2020-09-22 DIAGNOSIS — D0439 Carcinoma in situ of skin of other parts of face: Secondary | ICD-10-CM | POA: Diagnosis not present

## 2020-09-22 DIAGNOSIS — D225 Melanocytic nevi of trunk: Secondary | ICD-10-CM | POA: Diagnosis not present

## 2020-09-22 DIAGNOSIS — X32XXXD Exposure to sunlight, subsequent encounter: Secondary | ICD-10-CM | POA: Diagnosis not present

## 2020-09-22 DIAGNOSIS — L0202 Furuncle of face: Secondary | ICD-10-CM | POA: Diagnosis not present

## 2020-10-26 ENCOUNTER — Other Ambulatory Visit (HOSPITAL_COMMUNITY): Payer: Medicare HMO

## 2020-10-27 ENCOUNTER — Other Ambulatory Visit (HOSPITAL_COMMUNITY)
Admission: RE | Admit: 2020-10-27 | Discharge: 2020-10-27 | Disposition: A | Discharge status: Home / Self Care | Payer: Medicare HMO | Source: Ambulatory Visit | Attending: Internal Medicine | Admitting: Internal Medicine

## 2020-10-27 DIAGNOSIS — Z20822 Contact with and (suspected) exposure to covid-19: Secondary | ICD-10-CM | POA: Insufficient documentation

## 2020-10-27 LAB — SARS CORONAVIRUS 2 (TAT 6-24 HRS): SARS Coronavirus 2: NEGATIVE

## 2020-10-28 ENCOUNTER — Ambulatory Visit (INDEPENDENT_AMBULATORY_CARE_PROVIDER_SITE_OTHER): Payer: Medicare HMO | Admitting: Internal Medicine

## 2020-10-28 ENCOUNTER — Ambulatory Visit: Payer: Medicare HMO | Admitting: Internal Medicine

## 2020-10-28 ENCOUNTER — Other Ambulatory Visit: Payer: Self-pay

## 2020-10-28 ENCOUNTER — Encounter (HOSPITAL_BASED_OUTPATIENT_CLINIC_OR_DEPARTMENT_OTHER): Payer: Medicare HMO | Admitting: Cardiology

## 2020-10-28 ENCOUNTER — Encounter: Payer: Self-pay | Admitting: Internal Medicine

## 2020-10-28 VITALS — BP 124/62 | HR 81 | Temp 98.2°F | Ht 75.0 in | Wt 305.6 lb

## 2020-10-28 DIAGNOSIS — Z5181 Encounter for therapeutic drug level monitoring: Secondary | ICD-10-CM

## 2020-10-28 DIAGNOSIS — J849 Interstitial pulmonary disease, unspecified: Secondary | ICD-10-CM

## 2020-10-28 DIAGNOSIS — J84112 Idiopathic pulmonary fibrosis: Secondary | ICD-10-CM | POA: Diagnosis not present

## 2020-10-28 LAB — PULMONARY FUNCTION TEST
DL/VA % pred: 99 %
DL/VA: 3.83 ml/min/mmHg/L
DLCO cor % pred: 76 %
DLCO cor: 21.84 ml/min/mmHg
DLCO unc % pred: 76 %
DLCO unc: 21.84 ml/min/mmHg
FEF 25-75 Post: 3.27 L/sec
FEF 25-75 Pre: 2.8 L/sec
FEF2575-%Change-Post: 16 %
FEF2575-%Pred-Post: 127 %
FEF2575-%Pred-Pre: 109 %
FEV1-%Change-Post: 3 %
FEV1-%Pred-Post: 84 %
FEV1-%Pred-Pre: 81 %
FEV1-Post: 3.06 L
FEV1-Pre: 2.94 L
FEV1FVC-%Change-Post: 4 %
FEV1FVC-%Pred-Pre: 110 %
FEV6-%Change-Post: 0 %
FEV6-%Pred-Post: 77 %
FEV6-%Pred-Pre: 77 %
FEV6-Post: 3.64 L
FEV6-Pre: 3.65 L
FEV6FVC-%Change-Post: 0 %
FEV6FVC-%Pred-Post: 104 %
FEV6FVC-%Pred-Pre: 105 %
FVC-%Change-Post: 0 %
FVC-%Pred-Post: 73 %
FVC-%Pred-Pre: 74 %
FVC-Post: 3.68 L
FVC-Pre: 3.69 L
Post FEV1/FVC ratio: 83 %
Post FEV6/FVC ratio: 99 %
Pre FEV1/FVC ratio: 80 %
Pre FEV6/FVC Ratio: 99 %
RV % pred: 72 %
RV: 2.1 L
TLC % pred: 70 %
TLC: 5.72 L

## 2020-10-28 LAB — HEPATIC FUNCTION PANEL
ALT: 50 U/L (ref 0–53)
AST: 32 U/L (ref 0–37)
Albumin: 3.9 g/dL (ref 3.5–5.2)
Alkaline Phosphatase: 60 U/L (ref 39–117)
Bilirubin, Direct: 0.1 mg/dL (ref 0.0–0.3)
Total Bilirubin: 0.3 mg/dL (ref 0.2–1.2)
Total Protein: 7.1 g/dL (ref 6.0–8.3)

## 2020-10-28 NOTE — Patient Instructions (Addendum)
ICD-10-CM   1. IPF (idiopathic pulmonary fibrosis) ([place])  J84.112   2. Encounter for therapeutic drug monitoring  Z51.81    Glad you are tolerating pirfenidone well. [Start date of pirfenidone January 2022] Pulmonary fibrosis is slowly progressed in 6 months but prior to the start of pirfenidone Glad you feel that pirfenidone seems to be helping you We discussed clinical trials as a care option We discussed pulmonary rehabilitation We discussed patient support group  Plan -Check liver function test today and then every month for the next 6 months -Email Marlane Mingle the support group leader on [email or web address] -> and he can get you the link for the local support group meetings. Also visit [email or web address] to get more information -You will reflect on clinical trials as a care option but at this point in time because of shortening between [place] in [place] we will hold off -We will hold off on referring pulmonary rehabilitation because of back and knee problems -Okay to continue omega-3 because it is helping her triglycerides  Follow-up -Repeat liver function test in 4-6 weeks month either in [place] can do a televisit or a face-to-face visit with Dr. Chase Caller or nurse practitioner in 4-6 weeks

## 2020-10-28 NOTE — Progress Notes (Signed)
PFT done today.

## 2020-10-28 NOTE — Progress Notes (Signed)
OV 06/09/2020  Subjective:  Patient ID: Marcus [name], male , DOB: 1941/02/02 , age 80 y.o. , MRN: [id number] , ADDRESS: [address] [place] [address]  PCP Hulan Fess, MD Cardiologist Dr. Golden Hurter   06/09/2020 -   Chief Complaint  Patient presents with  . Consult    sob for the last year, worse for the last 6 month.     HPI Marcus [name] 80 y.o. -80 year old male with a history of atherosclerotic coronary artery disease [status post remote PCI of LAD], hypertension and hyperlipidemia.  History of transaminitis that resolved after stopping ethanol and Tylenol.  Lower extremity Dopplers in 2017 30-49% left common femoral arterial stenosis.  He saw Dr. Radford Pax cardiology in May 2021 with new onset of shortness of breath that is progressive.  Associated with exertional chest pressure.  At baseline he goes to [place] a lot and walks 3 miles [place] time [place] the beach but as of May 2021 he was only able to walk 5 feet and had to stop halfway through that walk.  Also notices walking up stairs relieved by rest.  Overall dyspnea on exertion for 1 year.  Worse in the last 6 months but he also says that he some better in the last few months.  Denies any proximal nocturnal dyspnea orthopnea or lower extremity edema dizziness or palpitations or syncope.  Because of this he underwent nuclear medicine cardiac stress test February 14, 2020 that was low risk with ejection fraction 65%.  But he has had persistent symptoms and therefore has been referred here.  He then had pulmonary function test April 02, 2020 that shows mild restriction with FVC 3.82 L / 75% total lung capacity 5.76/71% but normal DLCO.  But because of persistent symptoms has been referred here.  Of note his echo showed grade 1 diastolic dysfunction.  Of note he also has a chronic cough during the same time.  -He tells me that recently his [facility] duct died out and after that the cough improved when the [facility] duct was off.  He is  now going to get his [facility] duct evaluated.    He also reports 10 pound weight gain during this time.  Normally is around 290 pounds.  Now he is at 300 pounds.  He is also been more sedentary because of the pandemic.  He says during this time his wife has noticed that he occasionally snores.  He does fall easily fatigued during the daytime.  He also has disrupted sleep pattern.  I did a stop bang questionnaire and it is 6 out of 8 suggesting significantly elevated risk for sleep apnea.  He has never had a sleep study.`  He is going back to [place], [place] where he owns another home.  He will not be able to do test in the next few weeks.   Results for KAYMAN, SNUFFER (MRN [id number]) as of 06/09/2020 14:16  Ref. Range 03/23/2017 09:13  Please Note (HCV): Unknown Comment  Anti Nuclear Antibody (ANA) Latest Ref Range: Negative  Negative  ANCA Proteinase 3 Latest Ref Range: 0.0 - 3.5 U/mL <3.5  Atypical pANCA Latest Ref Range: Neg:<1:20 titer <1:20  ENA SSA (RO) Ab Latest Ref Range: 0.0 - 0.9 AI <0.2  ENA SSB (LA) Ab Latest Ref Range: 0.0 - 0.9 AI <0.2  Deamidated Gliadin Abs, IgG Latest Ref Range: 0 - 19 units 2  Myeloperoxidase Ab Latest Ref Range: 0.0 - 9.0 U/mL <9.0  RA  Latex Turbid. Latest Ref Range: 0.0 - 13.9 IU/mL <10.0  Transglutaminase IgA Latest Ref Range: 0 - 3 U/mL <2  Cytoplasmic (C-ANCA) Latest Ref Range: Neg:<1:20 titer <1:20  P-ANCA Latest Ref Range: Neg:<1:20 titer <1:20  Antigliadin Abs, IgA Latest Ref Range: 0 - 19 units 6  IgG (Immunoglobin G), Serum Latest Ref Range: 700 - 1,600 mg/dL 825  IgM (Immunoglobulin M), Srm Latest Ref Range: 15 - 143 mg/dL 126  IgA/Immunoglobulin A, Serum Latest Ref Range: 61 - 437 mg/dL 312  Lyme IgG/IgM Ab Latest Ref Range: 0.00 - 0.90 ISR <0.91    NP visit Nov 2021   07/23/2020  - Visit   80 year old male former smoker followed in our office for dyspnea on exertion and chronic cough.  He is established with Dr. Chase Caller.  He was seen for an  initial consult in September/2021.  Plan of care from that consult with Dr. Chase Caller was as follows: Obtain high-resolution CT chest, do simple walk test, CBC with differential, IgE.  Stop bang score 6.  Was referred to Dr. Radford Pax for sleep study.  Patient presenting today as follow-up.  Patient reporting that he has been scheduled for home sleep study with cardiology on 08/11/2020.  Insurance would not pay for an in lab study.  Patient denies any sort of family history of lung disease.  He does report potential exposures when he was in the service for 2 years 1 of those years being in [place].  He reports a previous history of a primary care provider reporting that when he returned he may have had malaria and he was on malaria medications.  He is unsure of the validity of this.  Patient also worked in copper rod production for around 3 years.  He was around acid tanks for around 1 to 2 years during the rod washing.  He did not wear a mask.  He reports there is lots of smoke, copper dust as well as he to wear special close due to the exposures of the acid.  Patient is also currently dealing with an HVAC issue.  There is concerned there may be mold in this.  Both him and his wife have had respiratory symptoms of increased cough when the machine was running.  Is currently turned off and is being prepared to be cleaned.  He reports that the initial inspection by the HVAC team felt that there was no mold present.  Patient denies any persistent issues of acid reflux.  He does have a history of GERD and has a history of hiatal hernia.  He was treated with Nexium for around 6 to 8 weeks.  He reports he has been off this medication for many years.  He does not have breakthrough reflux symptoms or require Tums.  Patient continues to have aspects of shortness of breath occasionally.  Especially with physical exertion.  Patient also has an occasional cough.  Cough is more present in the morning when he is waking up.   Sometimes cough is productive.  He does have significant clear nasal drainage.  He is occasionally using a nasal medication that he believes is Flonase that his wife purchased over-the-counter.   OV 08/25/2020   Subjective:  Patient ID: Marcus [name], male , DOB: April 28, 1941, age 83 y.o. years. , MRN: [id number],  ADDRESS: [address] [place] [address] PCP  Hulan Fess, MD Providers : Treatment Team:  Attending Provider: Brand Males, MD Patient Care Team: Hulan Fess, MD as PCP - General (  Family Medicine) Sueanne Margarita, MD as PCP - Cardiology (Cardiology)    Chief Complaint  Patient presents with  . Follow-up    ILD, doing ok, occ cough with some mucous       HPI Marcus [name] 80 y.o. -returns for follow-up to discuss his test results.  He is here with his wife who I am meeting for the first time.  His high-resolution CT chest is interpreted as indeterminate for UIP.  I personally visualized this I personally thought it was probable UIP.  I took a second opinion from Dr. Carilyn Goodpasture thoracic radiologist who feels that patient could get classified as probable UIP on the CT scan.  Long discussion was held with the patient but he did not bring his ILD questionnaire.  He had to bring this after the visit.  Therefore parts of this dictation happened after his visit but on the same day.  His main concern is that he will die prematurely.  [facility] Integrated Comprehensive ILD Questionnaire  Symptoms: He tells me that he had insidious onset of shortness of breath for the last 12 months.  Since it started it is the same.  He has level 1 dyspnea for household work, shopping and walking at a level pace.  Level 2 dyspnea on walking up stairs.  He does have difficulty keeping up with others of his age.  He has neuropathy and hip soreness.  He also has a cough since March 2021.  It is the same since it started moderate in severity.  It is dry.  He does clear his throat and he  does feel a tickle in his throat.  There is no hemoptysis or sputum production.  No wheezing no nausea no vomiting no diarrhea.  The cough is mostly in the morning.  His symptoms do feel better when he stops activity for 5 minutes.  It makes him feel more short of breath when he does vigorous activity.  No associated symptoms.   Past Medical History : He has a history of coronary artery disease with 1 stent. He is undergoing a sleep apnea work-up.  He has chronic kidney disease stage III according to his history. Malaria in [place]. Polio as a kid   He does not have rheumatoid arthritis.  No scleroderma no lupus no polymyositis no Sjogren's.  No other vasculitis.  No thyroid disease no diabetes.  No stroke no seizures.  No hepatitis no pneumonia.  His creatinine in May 2021 was 1.58 mg percent with a GFR of 42-48.  In October 2019 his creatinine was 1.35 mg percent.   Of note he says his dental hygienist has noted persistent redness in his hard palate.  He is asking me to refer him to ENT specialist and I have obliged  ROS: He does have arthralgia related to gout.  He has seborrhea.  He denies any oral ulcers or heartburn or vomiting or nausea.  Denies any weight loss.  He has obesity.  No recurrent fever no dryness.  No fatigue   FAMILY HISTORY of LUNG DISEASE: No pulmonary fibrosis no COPD.  His sister has autoimmune disease Brother has asthma   EXPOSURE HISTORY: He smokes cigarettes between 1960 and 1966 2 packs.  He also smoked cigars for 20 years.  No passive smoking.  No electronic cigarettes no marijuana no vaping no cocaine no intravenous drug use. Was      HOME and HOBBY DETAILS : Single-family home [place] for the last 10  years.  Age of the home is 25 years.  It is a 5 bedroom house.  There is no dampness.  No mold or mildew no humidifier use no CPAP use no nebulizer use.  No steam iron use no Jacuzzi use no misting Fountain.  No pet birds or parakeets no pet  gerbils or hamsters.  No feather pillows no mold in the [facility] duct.  No music habits.  No gardening habits.  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  During this time he was exposed to gas fumes and chemicals he reports that sulfuric acid copper out washing.  Otherwise negative for organic and inorganic antigens.   PULMONARY TOXICITY HISTORY (27 items): Negative     SYMPTOM SCALE -  06/09/2020  08/25/2020   O2 use ra ra  Shortness of Breath 0 -> 5 scale with 5 being worst (score 6 If unable to do)   At rest 0 0  Simple tasks - showers, clothes change, eating, shaving 1 0  Household (dishes, doing bed, laundry) 2 1  Shopping 2 2  Walking level at own pace 3 2  Walking up Stairs 3 2  Total (30-36) Dyspnea Score 11 7  How bad is your cough? 2 4  How bad is your fatigue 4 3  How bad is nausea 0 0  How bad is vomiting?  00 0  How bad is diarrhea? 0 0  How bad is anxiety? 0 0  How bad is depression 0 0       Simple office walk 185 feet x  3 laps goal with forehead probe 06/09/2020  08/25/2020   O2 used ra   Number laps completed 3   Comments about pace nl 100% and 64/min  Resting Pulse Ox/HR 100% and 61/min   Final Pulse Ox/HR 97% and 98/min 98% and 80/min  Desaturated </= 88% no   Desaturated <= 3% points yes   Got Tachycardic >/= 90/min yes   Symptoms at end of test x   Miscellaneous comments x    PFT Results Latest Ref Rng & Units 04/02/2020  FVC-Pre L 3.82  FVC-Predicted Pre % 75  FVC-Post L 3.85  FVC-Predicted Post % 76  Pre FEV1/FVC % % 80  Post FEV1/FCV % % 83  FEV1-Pre L 3.04  FEV1-Predicted Pre % 83  FEV1-Post L 3.20  DLCO uncorrected ml/min/mmHg 23.34  DLCO UNC% % 81  DLCO corrected ml/min/mmHg 23.34  DLCO COR %Predicted % 81  DLVA Predicted % 103  TLC L 5.76  TLC % Predicted % 71  RV % Predicted % 72    Radiology below were read by Dr.  Lorin Picket is indeterminate for UIP.  My personal opinion this is probable UIP.  Dr. Carilyn Goodpasture radiologist can also concur with me it is probable UIP   Narrative & Impression  CLINICAL DATA:  Shortness of breath. Productive cough and shortness of breath exertion for approximately 1 year.  EXAM: CT CHEST WITHOUT CONTRAST  TECHNIQUE: Multidetector CT imaging of the chest was performed following the standard protocol without intravenous contrast. High resolution imaging of the lungs, as well as inspiratory and expiratory imaging, was performed.  COMPARISON:  None.  FINDINGS: Cardiovascular: Atherosclerotic calcification of the aorta, aortic valve and coronary arteries. Pulmonic trunk is enlarged. Heart is at the upper limits of normal in size to mildly  enlarged. No pericardial effusion.  Mediastinum/Nodes: No pathologically enlarged mediastinal or axillary lymph nodes. Hilar regions are difficult to evaluate without IV contrast. Esophagus is unremarkable.  Lungs/Pleura: Mild basilar predominant subpleural reticulation, ground-glass and traction bronchiectasis/bronchiolectasis. Findings persist on prone imaging. No definitive honeycombing. No air trapping. 3 mm left upper lobe nodule (3/57). 4 mm peripheral left lower lobe nodule (3/106). Mild centrilobular emphysema. No pleural fluid. Airway is unremarkable.  Upper Abdomen: Visualized portions of the liver, gallbladder, adrenal glands, kidneys, spleen, pancreas, stomach and bowel are grossly unremarkable. No upper abdominal adenopathy.  Musculoskeletal: Degenerative changes in the spine.  IMPRESSION: 1. Mild basilar predominant subpleural fibrosis may be due to nonspecific interstitial pneumonitis or usual interstitial pneumonitis. Findings are indeterminate for UIP per consensus guidelines: Diagnosis of Idiopathic Pulmonary Fibrosis: An Official ATS/ERS/JRS/ALAT Clinical Practice Guideline. [place], Iss 5, [phone], May 13 2017. 2. Pulmonary nodules measure up to 4 mm. No follow-up needed if patient is low-risk (and has no known or suspected primary neoplasm). Non-contrast chest CT can be considered in 12 months if patient is high-risk. This recommendation follows the consensus statement: Guidelines for Management of Incidental Pulmonary Nodules Detected on CT Images: From the Fleischner Society 2017; Radiology 2017; 284:228-243. 3. Aortic atherosclerosis (ICD10-I70.0). Coronary artery calcification. 4. Enlarged pulmonic trunk, indicative of pulmonary arterial hypertension. 5.  Emphysema (ICD10-J43.9).   Electronically Signed   By: Lorin Picket M.D.   On: 07/15/2020 14:33    Results for ROCHELL, MABIE (MRN [id number]) as of 08/25/2020 10:28  Ref. Range 07/23/2020 09:56 07/23/2020 09:58  Anti Nuclear Antibody (ANA) Latest Ref Range: NEGATIVE  POSITIVE (A)   ANA Pattern 1 Unknown Nuclear, Homogeneous (A)   ANA Titer 1 Latest Units: titer 4:78 (H)   Cyclic Citrullin Peptide Ab Latest Units: UNITS <16   RA Latex Turbid. Latest Ref Range: <14 IU/mL <14   ANA,IFA RA DIAG PNL W/RFLX TIT/PATN Unknown Rpt (A)   Anti-Jo-1 Ab (RDL) Latest Ref Range: <20 Units  <20  Anti-PL-7 Ab (RDL) Latest Ref Range: Negative   Negative  Anti-PL-12 Ab (RDL) Latest Ref Range: Negative   Negative  Anti-EJ Ab (RDL) Latest Ref Range: Negative   Negative  Anti-OJ Ab (RDL) Latest Ref Range: Negative   Negative  Anti-SRP Ab (RDL) Latest Ref Range: Negative   Negative  Anti-Mi-2 Ab (RDL) Latest Ref Range: Negative   Negative  Anti-TIF-1gamma Ab (RDL) Latest Ref Range: <20 Units  <20  Anti-MDA-5 Ab (CADM-140)(RDL) Latest Ref Range: <20 Units  <20  Anti-NXP-2 (P140) Ab (RDL) Latest Ref Range: <20 Units  <20  Anti-SAE1 Ab, IgG (RDL) Latest Ref Range: <20 Units  <20  Anti-PM/Scl-100 Ab (RDL) Latest Ref Range: <20 Units  <20  Anti-Ku Ab (RDL) Latest Ref Range: Negative   Negative   Anti-SS-A 52kD Ab, IgG (RDL) Latest Ref Range: <20 Units  <20  Anti-U1 RNP Ab (RDL) Latest Ref Range: <20 Units  <20  Anti-U2 RNP Ab (RDL) Latest Ref Range: Negative   Negative  Anti-U3 RNP (Fibrillarin)(RDL) Latest Ref Range: Negative   Negative  SSA (Ro) (ENA) Antibody, IgG Latest Ref Range: <1.0 NEG AI <1.0 NEG   SSB (La) (ENA) Antibody, IgG Latest Ref Range: <1.0 NEG AI <1.0 NEG   Scleroderma (Scl-70) (ENA) Antibody, IgG Latest Ref Range: <1.0 NEG AI <1.0 NEG     OV 10/28/2020  Subjective:  Patient ID: Marcus [name], male , DOB: 05/13/1941 , age 73 y.o. , MRN: [id number] ,  ADDRESS: [address] [place] [address] PCP Hulan Fess, MD Patient Care Team: Hulan Fess, MD as PCP - General (Family Medicine) Sueanne Margarita, MD as PCP - Cardiology (Cardiology)  This Provider for this visit: Treatment Team:  Attending Provider: Brand Males, MD    10/28/2020 -   Chief Complaint  Patient presents with  . Follow-up    Get pft results.  Gets bloody nose when he blows his nose, small amount of blood, just since starting Esbriet.    IPF dx 08/25/2020: High PRob  clinical suspicion is you have IPF based on age greater than 61, male gender, Caucasian ethnicity, negative serology profile, history of acid reflux, previous exposure to smoke and copper and prior history of smoking and prob UIP on CT.  Start esbriet Januaryc 08, 2022 HPI Marcus [name] 80 y.o. -follow-up idiopathic pulmonary fibrosis new diagnosis December 2021. We started him on pirfenidone January 2022. He says he is tolerating it really well. His symptom score is stable. In fact he feels that pirfenidone is helping him. He feels his lungs have opened up. He feels that the antifibrotic actions have kicked in. He does admit this could be psychological. He did complain of some bloody nose when he blows up. He wondered if it is because of pirfenidone. Explained to him pirfenidone does not have any antiplatelet  agents. Explained that likely this is due to the cold winter and dry air. He shuttles between [place] and [place]. Did explain to him that if he were to relocate to [place] I would set him up with excellent ILD specialist at [facility]. We discussed patient support group. He seems interested. We discussed pulmonary rehabilitation but he has back and spine issues and knee issues that make it difficult. He wants to reflect on this. We discussed clinical trials as a care option including specifically all the details below. He wants to reflect on this but he feels that shuttling between [place] in [place] can be difficult to do clinical trials. However once is stable on the drug and sometimes past he might be able to do it.  There are no other new issues. Last visit I did indicate to him to consider stopping his omega-3 because of concern of omega-3 making acid reflux worse which is a risk factor for IPF. However he says it has helped his triglycerides. He wants to continue on this. His acid reflux is not super active. I have supported him in this decision.      1. Scientific Purpose  Clinical research is designed to produce generalizable knowledge and to answer questions about the safety and efficacy of intervention(s) under study in order to determine whether or not they may be useful for the care of future patients.  2. Study Procedures  Participation in a trial may involve procedures or tests, in addition to the intervention(s) under study, that are intended only or primarily to generate scientific knowledge and that are otherwise not necessary for patient care.   3. Uncertainty  For intervention(s) under study in clinical research, there often is less knowledge and more uncertainty about the risks and benefits to a population of trial participants than there is when a doctor offers a patient standard interventions.   4. Adherence to Protocol  Administration of the intervention(s)  under study is typically based on a strict protocol with defined dose, scheduling, and use or avoidance of concurrent medications, compared to administration of standard interventions.  5. Clinician as Investigator  Clinicians who  are in health care settings provide treatment; in a clinical trial setting, they are also investigating safety and efficacy of an intervention. In otherwise your doctor or nurse practitioner can be wearing 2 hats - one as care giver another as Company secretary  6. Patient as Visual merchandiser Subject  Patients participating in research trials are research subjects or volunteers. In other words participating in research is 100% voluntary and at one's own free weill. The decision to participate or not participate will NOT affect patient care and the doctor-patient relationship in any way  7. Financial Conflict of Interest Disclosure  One or more of the investigators of  the research trial might have an investment interest in [employer], [facility] the clinical trials practice through which the study is being conducted. Study sponsor pays the practice for the conduct of the study.        SYMPTOM SCALE -  06/09/2020  08/25/2020  10/28/2020   O2 use ra ra ra  Shortness of Breath 0 -> 5 scale with 5 being worst (score 6 If unable to do)    At rest 0 0 0  Simple tasks - showers, clothes change, eating, shaving 1 0 1  Household (dishes, doing bed, laundry) _0 Shopping _1 Walking level at own pace _2 Walking up Stairs _3 Total (30-36) Dyspnea Score _4 How bad is your cough? _5 How bad is your fatigue _6 How bad is nausea 0 0 0  How bad is vomiting?  00 0 0  How bad is diarrhea? 0 0 0  How bad is anxiety? 0 0 0  How bad is depression 0 0 0       Simple office walk 185 feet x  3 laps goal with forehead probe 06/09/2020  08/25/2020  10/28/2020   O2 used ra  ra  Number laps completed 3  3  Comments about pace nl 100% and 64/min  moderate  Resting Pulse Ox/HR 100% and 61/min  100% and 63  Final Pulse Ox/HR 97% and 98/min 98% and 80/min 99% and 100  Desaturated </= 88% no    Desaturated <= 3% points yes    Got Tachycardic >/= 90/min yes    Symptoms at end of test x  Mod desypnea  Miscellaneous comments x  Dyspnea started lap 1.NEver stopped     PFT  PFT Results Latest Ref Rng & Units 10/28/2020 04/02/2020  FVC-Pre L 3.69 3.82  FVC-Predicted Pre % 74 75  FVC-Post L 3.68 3.85  FVC-Predicted Post % 73 76  Pre FEV1/FVC % % 80 80  Post FEV1/FCV % % 83 83  FEV1-Pre L 2.94 3.04  FEV1-Predicted Pre % 81 83  FEV1-Post L 3.06 3.20  DLCO uncorrected ml/min/mmHg 21.84 23.34  DLCO UNC% % 76 81  DLCO corrected ml/min/mmHg 21.84 23.34  DLCO COR %Predicted % 76 81  DLVA Predicted % 99 103  TLC L 5.72 5.76  TLC % Predicted % 70 71  RV % Predicted % 72 72       has a past medical history of Ascending aorta dilatation (HCC), Benign essential HTN (09/09/2013), Coronary artery disease (08/2013), Dyslipidemia, Elevated fasting glucose, Fatty liver, Glaucoma, Gout, Hyperlipidemia, Hypertension, Joint pain, LAD (lymphadenopathy), Obesity, Pure hypercholesterolemia (09/09/2013), PVD (peripheral vascular disease) ([place]) (06/12/2018), and Vision abnormalities.   reports that he quit smoking about 8 months ago. His smoking use  included cigars. He quit after 10.00 years of use. He has never used smokeless tobacco.  Past Surgical History:  Procedure Laterality Date  . ADENOIDECTOMY    . CATARACT EXTRACTION    . CORONARY ANGIOPLASTY WITH STENT PLACEMENT  2009  . EYE SURGERY    . TONSILLECTOMY    . WISDOM TOOTH EXTRACTION      Allergies  Allergen Reactions  . Nsaids Other (See Comments)    Immunization History  Administered Date(s) Administered  . Fluad Quad(high Dose 65+) 05/13/2020  . Influenza, High Dose Seasonal PF 06/06/2019  . PFIZER(Purple Top)SARS-COV-2 Vaccination 09/28/2019, 10/19/2019, 06/08/2020  .  Pneumococcal Conjugate-13 10/23/2014  . Pneumococcal Polysaccharide-23 08/27/2012  . Tdap 08/27/2012  . Zoster 08/29/2012, 09/28/2019, 03/11/2020    Family History  Problem Relation Age of Onset  . Breast cancer Mother   . Heart attack Father   . Rheumatic fever Father   . CAD Sister   . Multiple sclerosis Sister   . CAD Brother   . Neuropathy Neg Hx      Current Outpatient Medications:  .  allopurinol (ZYLOPRIM) 300 MG tablet, Take 300 mg by mouth daily., Disp: , Rfl:  .  aspirin 81 MG tablet, Take 81 mg by mouth daily., Disp: , Rfl:  .  atorvastatin (LIPITOR) 80 MG tablet, Take 1 tablet (80 mg total) by mouth daily., Disp: 90 tablet, Rfl: 3 .  dorzolamide-timolol (COSOPT) 22.3-6.8 MG/ML ophthalmic solution, Place 1 drop into both eyes 2 (two) times daily., Disp: , Rfl:  .  fluticasone (FLONASE) 50 MCG/ACT nasal spray, Place into the nose., Disp: , Rfl:  .  latanoprost (XALATAN) 0.005 % ophthalmic solution, Place 1 drop into both eyes daily., Disp: , Rfl:  .  loratadine (CLARITIN) 10 MG tablet, Take by mouth., Disp: , Rfl:  .  losartan (COZAAR) 100 MG tablet, Take 1 tablet (100 mg total) by mouth daily., Disp: 90 tablet, Rfl: 3 .  metoprolol succinate (TOPROL-XL) 100 MG 24 hr tablet, TAKE 1 TABLET DAILY WITH ORIMMEDIATELY FOLLOWING A    MEAL, Disp: 90 tablet, Rfl: 3 .  minocycline (MINOCIN,DYNACIN) 100 MG capsule, Take 100 mg by mouth as needed. For skin acne, Disp: , Rfl: 3 .  nitroGLYCERIN (NITROSTAT) 0.4 MG SL tablet, Place 0.4 mg under the tongue every 5 (five) minutes as needed for chest pain. , Disp: , Rfl:  .  Omega-3 Fatty Acids (FISH OIL) 1000 MG CAPS, Take 2,000 capsules by mouth in the morning and at bedtime. 1 capsules (2086m) by mouth two times a day, Disp: , Rfl:  .  pimecrolimus (ELIDEL) 1 % cream, APPLY TO AFFECTED AREA ON FACE TWICE A DAY AS NEEDED, Disp: , Rfl:  .  Pirfenidone (ESBRIET) 267 MG TABS, Take 3 tablets (801 mg total) by mouth with breakfast, with lunch,  and with evening meal., Disp: 810 tablet, Rfl: 3 .  tamsulosin (FLOMAX) 0.4 MG CAPS capsule, Take 0.4 mg by mouth daily., Disp: , Rfl:  No current facility-administered medications for this visit.  Facility-Administered Medications Ordered in Other Visits:  .  technetium tetrofosmin (TC-MYOVIEW) injection 325.8millicurie, 352.7millicurie, Intravenous, Once PRN, Turner, Traci R, MD      Objective:   Vitals:   10/28/20 1004  BP: 124/62  Pulse: 81  Temp: 98.2 F (36.8 C)  TempSrc: Temporal  SpO2: 98%  Weight: (!) 305 lb 9.6 oz (138.6 kg)  Height: _0  (1.905 m)    Estimated body mass index is 38.2 kg/m as  calculated from the following:   Height as of this encounter: _0  (1.905 m).   Weight as of this encounter: 305 lb 9.6 oz (138.6 kg).  _1 @  Filed Weights   10/28/20 1004  Weight: (!) 305 lb 9.6 oz (138.6 kg)     Physical Exam General: No distress. obese Neuro: Alert and Oriented x 3. GCS 15. Speech normal Psych: Pleasant Resp:  Barrel Chest - bi.  Wheeze - bi, Crackles - some basen, No overt respiratory distress CVS: Normal heart sounds. Murmurs - ni Ext: Stigmata of Connective Tissue Disease - noi HEENT: Normal upper airway. PEERL +. No post nasal drip        Assessment:       ICD-10-CM   1. IPF (idiopathic pulmonary fibrosis) ([place])  J84.112   2. Encounter for therapeutic drug monitoring  Z51.81        Plan:     Patient Instructions     ICD-10-CM   1. IPF (idiopathic pulmonary fibrosis) ([place])  J84.112   2. Encounter for therapeutic drug monitoring  Z51.81    Glad you are tolerating pirfenidone well. [Start date of pirfenidone January 2022] Pulmonary fibrosis is slowly progressed in 6 months but prior to the start of pirfenidone Glad you feel that pirfenidone seems to be helping you We discussed clinical trials as a care option We discussed pulmonary rehabilitation We discussed patient support group  Plan -Check liver function test  today and then every month for the next 6 months -Email Marlane Mingle the support group leader on [email or web address] -> and he can get you the link for the local support group meetings. Also visit [email or web address] to get more information -You will reflect on clinical trials as a care option but at this point in time because of shortening between [place] in [place] we will hold off -We will hold off on referring pulmonary rehabilitation because of back and knee problems -Okay to continue omega-3 because it is helping her triglycerides  Follow-up -Repeat liver function test in 4-6 weeks month either in [place] can do a televisit or a face-to-face visit with Dr. Chase Caller or nurse practitioner in 4-6 weeks  ( Level 05 visit: Estb 40-54 min  in  visit type: on-site physical face to visit  in total care time and counseling or/and coordination of care by this undersigned MD - Dr Brand Males. This includes one or more of the following on this same day 10/28/2020: pre-charting, chart review, note writing, documentation discussion of test results, diagnostic or treatment recommendations, prognosis, risks and benefits of management options, instructions, education, compliance or risk-factor reduction. It excludes time spent by the [place] or office staff in the care of the patient. Actual time 40 min)    SIGNATURE    Dr. Brand Males, M.D., F.C.C.P,  Pulmonary and Critical Care Medicine Staff Physician, [place] Director - Interstitial Lung Disease  Program  Pulmonary [facility] at [place], [place], [address]  Pager: [phone], If no answer or between  15:00h - 7:00h: call [phone] Telephone: [phone]  10:53 AM 10/28/2020

## 2020-11-03 DIAGNOSIS — D0439 Carcinoma in situ of skin of other parts of face: Secondary | ICD-10-CM | POA: Diagnosis not present

## 2020-11-03 DIAGNOSIS — L570 Actinic keratosis: Secondary | ICD-10-CM | POA: Diagnosis not present

## 2020-11-03 DIAGNOSIS — X32XXXD Exposure to sunlight, subsequent encounter: Secondary | ICD-10-CM | POA: Diagnosis not present

## 2020-11-07 NOTE — Progress Notes (Signed)
LFT normal on anti fibrotic. Please check again in 1 month

## 2020-11-17 ENCOUNTER — Ambulatory Visit: Payer: Self-pay | Admitting: Internal Medicine

## 2020-11-17 DIAGNOSIS — J84112 Idiopathic pulmonary fibrosis: Secondary | ICD-10-CM

## 2020-11-19 NOTE — Progress Notes (Signed)
Interstitial Lung Disease Multidisciplinary Conference   Marcus [name]    MRN [id number]    DOB Aug 05, 1941  Primary Care Physician:Little, Lennette Bihari, MD  Referring Physician: Dr Chase Caller  Time of Conference: 7.30am- 8.30am Date of conference: November 17, 2020 Location of Conference: -  Virtual  Participating Pulmonary: Dr. Brand Males, MD - yes,  Dr Marshell Garfinkel, MD - yes Pathology: Dr Jaquita Folds, MD - yes , Radiology: Dr Salvatore Marvel MD - no, Dr Vinnie Langton MD - yes,  Dr Lorin Picket, MD - no , Dr Eddie Candle - no Others: Alethia Berthold RN, Dr Madie Reno, Dr Lenice Llamas  Brief History: Does he have indeterminate or Prob UIP? Official radiology call was indeterminate. MR got 2nd opinion as Prob. Clinically he is IPF and progressive x 6 months on PFT. xxxxxxxxxxxxxxxxx   Serology:  Trace Positive AN 07/23/2020  MDD discussion of CT scan  Dr Weber Cooks read of nov 2021 CT - peripheral predom GG attenuation, ST, bronchieclectasis, bronchiectasis, CCG +. No HC. -> PROB UIP  - Date or time period of scan: 07/15/2020  - F - What is the final conclusion per 2018 ATS/Fleischner Criteria -  PROBABLE UIP  Pathology discussion of biopsy no:  PFTs:  PFT Results Latest Ref Rng & Units 10/28/2020 04/02/2020  FVC-Pre L 3.69 3.82  FVC-Predicted Pre % 74 75  FVC-Post L 3.68 3.85  FVC-Predicted Post % 73 76  Pre FEV1/FVC % % 80 80  Post FEV1/FCV % % 83 83  FEV1-Pre L 2.94 3.04  FEV1-Predicted Pre % 81 83  FEV1-Post L 3.06 3.20  DLCO uncorrected ml/min/mmHg 21.84 23.34  DLCO UNC% % 76 81  DLCO corrected ml/min/mmHg 21.84 23.34  DLCO COR %Predicted % 76 81  DLVA Predicted % 99 103  TLC L 5.72 5.76  TLC % Predicted % 70 71  RV % Predicted % 72 72     Labs: x*  MDD Impression/Recs:  IPF is clinical dx   Time Spent in preparation and discussion:  > 30 min    SIGNATURE   Dr. Brand Males, M.D., F.C.C.P,  Pulmonary and Critical Care Medicine Staff  Physician, [place] Director - Interstitial Lung Disease  Program  Pulmonary [facility] [place] at [employer], [place], [address]  Pager: [phone], If no answer or between  15:00h - 7:00h: call [phone] Telephone: [phone]  5:37 PM 11/19/2020 ...................................................................................................................Marland Kitchen References: Diagnosis of Hypersensitivity Pneumonitis in Adults. An Official ATS/JRS/ALAT Clinical Practice Guideline. Ragu G et al, [place] Aug 1;202(3):e36-e69.       Diagnosis of Idiopathic Pulmonary Fibrosis. An Official ATS/ERS/JRS/ALAT Clinical Practice Guideline. Raghu G et al, [place]. 2018 Sep 1;198(5):e44-e68.   IPF Suspected   Histopath ology Pattern      UIP  Probable UIP  Indeterminate for  UIP  Alternative  diagnosis    UIP  IPF  IPF  IPF  Non-IPF dx   HRCT   Probabe UIP  IPF  IPF  IPF (Likely)**  Non-IPF dx  Pattern  Indeterminate for UIP  IPF  IPF (Likely)**  Indeterminate  for IPF**  Non-IPF dx    Alternative diagnosis  IPF (Likely)**/ non-IPF dx  Non-IPF dx  Non-IPF dx  Non-IPF dx     Idiopathic pulmonary fibrosis diagnosis based upon HRCT and Biopsy paterns.  ** IPF is the likely diagnosis when any  of following features are present:  . Moderate-to-severe traction bronchiectasis/bronchiolectasis (defined as mild traction bronchiectasis/bronchiolectasis in four or more lobes including the lingual as a lobe, or moderate to severe traction bronchiectasis in two or more lobes) in a man over age 62 years or in a woman over age 18 years . Extensive (>30%) reticulation on HRCT and an age >70 years  . Increased neutrophils and/or absence of lymphocytosis in BAL fluid  . Multidisciplinary discussion reaches a confident diagnosis of IPF.   **Indeterminate for IPF  . Without an  adequate biopsy is unlikely to be IPF  . With an adequate biopsy may be reclassified to a more specific diagnosis after multidisciplinary discussion and/or additional consultation.   dx = diagnosis; HRCT = high-resolution computed tomography; IPF = idiopathic pulmonary fibrosis; UIP = usual interstitial pneumonia.

## 2020-11-23 DIAGNOSIS — H401123 Primary open-angle glaucoma, left eye, severe stage: Secondary | ICD-10-CM | POA: Diagnosis not present

## 2020-11-23 DIAGNOSIS — H401113 Primary open-angle glaucoma, right eye, severe stage: Secondary | ICD-10-CM | POA: Diagnosis not present

## 2020-12-08 ENCOUNTER — Encounter: Payer: Self-pay | Admitting: Internal Medicine

## 2020-12-08 ENCOUNTER — Other Ambulatory Visit: Payer: Self-pay

## 2020-12-08 ENCOUNTER — Ambulatory Visit: Payer: Medicare HMO | Admitting: Internal Medicine

## 2020-12-08 VITALS — BP 126/68 | HR 63 | Temp 97.6°F | Ht 75.0 in | Wt 300.2 lb

## 2020-12-08 DIAGNOSIS — J84112 Idiopathic pulmonary fibrosis: Secondary | ICD-10-CM

## 2020-12-08 DIAGNOSIS — Z5181 Encounter for therapeutic drug level monitoring: Secondary | ICD-10-CM | POA: Diagnosis not present

## 2020-12-08 LAB — HEPATIC FUNCTION PANEL
ALT: 37 U/L (ref 0–53)
AST: 28 U/L (ref 0–37)
Albumin: 3.7 g/dL (ref 3.5–5.2)
Alkaline Phosphatase: 73 U/L (ref 39–117)
Bilirubin, Direct: 0.1 mg/dL (ref 0.0–0.3)
Total Bilirubin: 0.5 mg/dL (ref 0.2–1.2)
Total Protein: 6.5 g/dL (ref 6.0–8.3)

## 2020-12-08 NOTE — Patient Instructions (Addendum)
ICD-10-CM   1. IPF (idiopathic pulmonary fibrosis) ([place])  J84.112   2. Encounter for therapeutic drug monitoring  Z51.81    Glad you are tolerating pirfenidone well. [Start date of pirfenidone January 2022] Pulmonary fibrosis is slowly progressed in 6 months but prior to the start of pirfenidone Glad you feel that pirfenidone seems to be helping you  Tongue biting probably not related to esbriet  Holding off rehab and clinical trials based on our discussion last month  Plan -Check liver function test today and then every month for the next 6 months -Email Marlane Mingle the support group leader on [email or web address]  -  Also visit [email or web address] to get more information --Okay to continue omega-3 because it is helping her triglycerides  Follow-up -Repeat liver function test in 4-6 weeks month either in [place] or [place] can do a televisit with  nurse practitioner in 6 weeks to ensure esbriet tolerance going well - do spirometry and dlco in 12 weeks  - return to see Dr Chase Caller in 12 weeks in 30 min slot

## 2020-12-08 NOTE — Addendum Note (Signed)
Addended by: Suzzanne Cloud E on: 12/08/2020 02:30 PM   Modules accepted: Orders

## 2020-12-08 NOTE — Progress Notes (Signed)
OV 06/09/2020  Subjective:  Patient ID: [name], male , DOB: 03-11-1941 , age 80 y.o. , MRN: [id number] , ADDRESS: [address] [place] [address]  PCP Hulan Fess, MD Cardiologist Dr. Golden Hurter   06/09/2020 -   Chief Complaint  Patient presents with  . Consult    sob for the last year, worse for the last 6 month.     HPI Billyjoe Go 80 y.o. -80 year old male with a history of atherosclerotic coronary artery disease [status post remote PCI of LAD], hypertension and hyperlipidemia.  History of transaminitis that resolved after stopping ethanol and Tylenol.  Lower extremity Dopplers in 2017 30-49% left common femoral arterial stenosis.  He saw Dr. Radford Pax cardiology in May 2021 with new onset of shortness of breath that is progressive.  Associated with exertional chest pressure.  At baseline he goes to [place] a lot and walks 3 miles [place] time [place] the beach but as of May 2021 he was only able to walk 5 feet and had to stop halfway through that walk.  Also notices walking up stairs relieved by rest.  Overall dyspnea on exertion for 1 year.  Worse in the last 6 months but he also says that he some better in the last few months.  Denies any proximal nocturnal dyspnea orthopnea or lower extremity edema dizziness or palpitations or syncope.  Because of this he underwent nuclear medicine cardiac stress test February 14, 2020 that was low risk with ejection fraction 65%.  But he has had persistent symptoms and therefore has been referred here.  He then had pulmonary function test April 02, 2020 that shows mild restriction with FVC 3.82 L / 75% total lung capacity 5.76/71% but normal DLCO.  But because of persistent symptoms has been referred here.  Of note his echo showed grade 1 diastolic dysfunction.  Of note he also has a chronic cough during the same time.  -He tells me that recently his [facility] duct died out and after that the cough improved when the [facility] duct was off.  He is now  going to get his [facility] duct evaluated.    He also reports 10 pound weight gain during this time.  Normally is around 290 pounds.  Now he is at 300 pounds.  He is also been more sedentary because of the pandemic.  He says during this time his wife has noticed that he occasionally snores.  He does fall easily fatigued during the daytime.  He also has disrupted sleep pattern.  I did a stop bang questionnaire and it is 6 out of 8 suggesting significantly elevated risk for sleep apnea.  He has never had a sleep study.`  He is going back to [place], [place] where he owns another home.  He will not be able to do test in the next few weeks.   Results for TEOFIL, MANIACI (MRN [id number]) as of 06/09/2020 14:16  Ref. Range 03/23/2017 09:13  Please Note (HCV): Unknown Comment  Anti Nuclear Antibody (ANA) Latest Ref Range: Negative  Negative  ANCA Proteinase 3 Latest Ref Range: 0.0 - 3.5 U/mL <3.5  Atypical pANCA Latest Ref Range: Neg:<1:20 titer <1:20  ENA SSA (RO) Ab Latest Ref Range: 0.0 - 0.9 AI <0.2  ENA SSB (LA) Ab Latest Ref Range: 0.0 - 0.9 AI <0.2  Deamidated Gliadin Abs, IgG Latest Ref Range: 0 - 19 units 2  Myeloperoxidase Ab Latest Ref Range: 0.0 - 9.0 U/mL <9.0  RA Latex  Turbid. Latest Ref Range: 0.0 - 13.9 IU/mL <10.0  Transglutaminase IgA Latest Ref Range: 0 - 3 U/mL <2  Cytoplasmic (C-ANCA) Latest Ref Range: Neg:<1:20 titer <1:20  P-ANCA Latest Ref Range: Neg:<1:20 titer <1:20  Antigliadin Abs, IgA Latest Ref Range: 0 - 19 units 6  IgG (Immunoglobin G), Serum Latest Ref Range: 700 - 1,600 mg/dL 825  IgM (Immunoglobulin M), Srm Latest Ref Range: 15 - 143 mg/dL 126  IgA/Immunoglobulin A, Serum Latest Ref Range: 61 - 437 mg/dL 312  Lyme IgG/IgM Ab Latest Ref Range: 0.00 - 0.90 ISR <0.91    NP visit Nov 2021   07/23/2020  - Visit   80 year old male former smoker followed in our office for dyspnea on exertion and chronic cough.  He is established with Dr. Chase Caller.  He was seen for an  initial consult in September/2021.  Plan of care from that consult with Dr. Chase Caller was as follows: Obtain high-resolution CT chest, do simple walk test, CBC with differential, IgE.  Stop bang score 6.  Was referred to Dr. Radford Pax for sleep study.  Patient presenting today as follow-up.  Patient reporting that he has been scheduled for home sleep study with cardiology on 08/11/2020.  Insurance would not pay for an in lab study.  Patient denies any sort of family history of lung disease.  He does report potential exposures when he was in the service for 2 years 1 of those years being in [place].  He reports a previous history of a primary care provider reporting that when he returned he may have had malaria and he was on malaria medications.  He is unsure of the validity of this.  Patient also worked in copper rod production for around 3 years.  He was around acid tanks for around 1 to 2 years during the rod washing.  He did not wear a mask.  He reports there is lots of smoke, copper dust as well as he to wear special close due to the exposures of the acid.  Patient is also currently dealing with an HVAC issue.  There is concerned there may be mold in this.  Both him and his wife have had respiratory symptoms of increased cough when the machine was running.  Is currently turned off and is being prepared to be cleaned.  He reports that the initial inspection by the HVAC team felt that there was no mold present.  Patient denies any persistent issues of acid reflux.  He does have a history of GERD and has a history of hiatal hernia.  He was treated with Nexium for around 6 to 8 weeks.  He reports he has been off this medication for many years.  He does not have breakthrough reflux symptoms or require Tums.  Patient continues to have aspects of shortness of breath occasionally.  Especially with physical exertion.  Patient also has an occasional cough.  Cough is more present in the morning when he is waking up.   Sometimes cough is productive.  He does have significant clear nasal drainage.  He is occasionally using a nasal medication that he believes is Flonase that his wife purchased over-the-counter.   OV 08/25/2020   Subjective:  Patient ID: [name], male , DOB: 06/12/1941, age 24 y.o. years. , MRN: [id number],  ADDRESS: [address] [place] [address] PCP  Hulan Fess, MD Providers : Treatment Team:  Attending Provider: Brand Males, MD Patient Care Team: Hulan Fess, MD as PCP - General (Family  Medicine) Sueanne Margarita, MD as PCP - Cardiology (Cardiology)    Chief Complaint  Patient presents with  . Follow-up    ILD, doing ok, occ cough with some mucous       HPI [name] 80 y.o. -returns for follow-up to discuss his test results.  He is here with his wife who I am meeting for the first time.  His high-resolution CT chest is interpreted as indeterminate for UIP.  I personally visualized this I personally thought it was probable UIP.  I took a second opinion from Dr. Carilyn Goodpasture thoracic radiologist who feels that patient could get classified as probable UIP on the CT scan.  Long discussion was held with the patient but he did not bring his ILD questionnaire.  He had to bring this after the visit.  Therefore parts of this dictation happened after his visit but on the same day.  His main concern is that he will die prematurely.  [place] Integrated Comprehensive ILD Questionnaire  Symptoms: He tells me that he had insidious onset of shortness of breath for the last 12 months.  Since it started it is the same.  He has level 1 dyspnea for household work, shopping and walking at a level pace.  Level 2 dyspnea on walking up stairs.  He does have difficulty keeping up with others of his age.  He has neuropathy and hip soreness.  He also has a cough since March 2021.  It is the same since it started moderate in severity.  It is dry.  He does clear his throat and he  does feel a tickle in his throat.  There is no hemoptysis or sputum production.  No wheezing no nausea no vomiting no diarrhea.  The cough is mostly in the morning.  His symptoms do feel better when he stops activity for 5 minutes.  It makes him feel more short of breath when he does vigorous activity.  No associated symptoms.   Past Medical History : He has a history of coronary artery disease with 1 stent. He is undergoing a sleep apnea work-up.  He has chronic kidney disease stage III according to his history. Malaria in [place]. Polio as a kid   He does not have rheumatoid arthritis.  No scleroderma no lupus no polymyositis no Sjogren's.  No other vasculitis.  No thyroid disease no diabetes.  No stroke no seizures.  No hepatitis no pneumonia.  His creatinine in May 2021 was 1.58 mg percent with a GFR of 42-48.  In October 2019 his creatinine was 1.35 mg percent.   Of note he says his dental hygienist has noted persistent redness in his hard palate.  He is asking me to refer him to ENT specialist and I have obliged  ROS: He does have arthralgia related to gout.  He has seborrhea.  He denies any oral ulcers or heartburn or vomiting or nausea.  Denies any weight loss.  He has obesity.  No recurrent fever no dryness.  No fatigue   FAMILY HISTORY of LUNG DISEASE: No pulmonary fibrosis no COPD.  His sister has autoimmune disease Brother has asthma   EXPOSURE HISTORY: He smokes cigarettes between 1960 and 1966 2 packs.  He also smoked cigars for 20 years.  No passive smoking.  No electronic cigarettes no marijuana no vaping no cocaine no intravenous drug use. Was      HOME and HOBBY DETAILS : Single-family home [place] for the last 10 years.  Age of the home is 25 years.  It is a 5 bedroom house.  There is no dampness.  No mold or mildew no humidifier use no CPAP use no nebulizer use.  No steam iron use no Jacuzzi use no misting Fountain.  No pet birds or parakeets no pet  gerbils or hamsters.  No feather pillows no mold in the [facility] duct.  No music habits.  No gardening habits.  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  During this time he was exposed to gas fumes and chemicals he reports that sulfuric acid copper out washing.  Otherwise negative for organic and inorganic antigens.   PULMONARY TOXICITY HISTORY (27 items): Negative     SYMPTOM SCALE -  06/09/2020  08/25/2020   O2 use ra ra  Shortness of Breath 0 -> 5 scale with 5 being worst (score 6 If unable to do)   At rest 0 0  Simple tasks - showers, clothes change, eating, shaving 1 0  Household (dishes, doing bed, laundry) 2 1  Shopping 2 2  Walking level at own pace 3 2  Walking up Stairs 3 2  Total (30-36) Dyspnea Score 11 7  How bad is your cough? 2 4  How bad is your fatigue 4 3  How bad is nausea 0 0  How bad is vomiting?  00 0  How bad is diarrhea? 0 0  How bad is anxiety? 0 0  How bad is depression 0 0       Simple office walk 185 feet x  3 laps goal with forehead probe 06/09/2020  08/25/2020   O2 used ra   Number laps completed 3   Comments about pace nl 100% and 64/min  Resting Pulse Ox/HR 100% and 61/min   Final Pulse Ox/HR 97% and 98/min 98% and 80/min  Desaturated </= 88% no   Desaturated <= 3% points yes   Got Tachycardic >/= 90/min yes   Symptoms at end of test x   Miscellaneous comments x    PFT Results Latest Ref Rng & Units 04/02/2020  FVC-Pre L 3.82  FVC-Predicted Pre % 75  FVC-Post L 3.85  FVC-Predicted Post % 76  Pre FEV1/FVC % % 80  Post FEV1/FCV % % 83  FEV1-Pre L 3.04  FEV1-Predicted Pre % 83  FEV1-Post L 3.20  DLCO uncorrected ml/min/mmHg 23.34  DLCO UNC% % 81  DLCO corrected ml/min/mmHg 23.34  DLCO COR %Predicted % 81  DLVA Predicted % 103  TLC L 5.76  TLC % Predicted % 71  RV % Predicted % 72    Radiology below were read by Dr.  Lorin Picket is indeterminate for UIP.  My personal opinion this is probable UIP.  Dr. Carilyn Goodpasture radiologist can also concur with me it is probable UIP   Narrative & Impression  CLINICAL DATA:  Shortness of breath. Productive cough and shortness of breath exertion for approximately 1 year.  EXAM: CT CHEST WITHOUT CONTRAST  TECHNIQUE: Multidetector CT imaging of the chest was performed following the standard protocol without intravenous contrast. High resolution imaging of the lungs, as well as inspiratory and expiratory imaging, was performed.  COMPARISON:  None.  FINDINGS: Cardiovascular: Atherosclerotic calcification of the aorta, aortic valve and coronary arteries. Pulmonic trunk is enlarged. Heart is at the upper limits of normal in size to mildly enlarged. No  pericardial effusion.  Mediastinum/Nodes: No pathologically enlarged mediastinal or axillary lymph nodes. Hilar regions are difficult to evaluate without IV contrast. Esophagus is unremarkable.  Lungs/Pleura: Mild basilar predominant subpleural reticulation, ground-glass and traction bronchiectasis/bronchiolectasis. Findings persist on prone imaging. No definitive honeycombing. No air trapping. 3 mm left upper lobe nodule (3/57). 4 mm peripheral left lower lobe nodule (3/106). Mild centrilobular emphysema. No pleural fluid. Airway is unremarkable.  Upper Abdomen: Visualized portions of the liver, gallbladder, adrenal glands, kidneys, spleen, pancreas, stomach and bowel are grossly unremarkable. No upper abdominal adenopathy.  Musculoskeletal: Degenerative changes in the spine.  IMPRESSION: 1. Mild basilar predominant subpleural fibrosis may be due to nonspecific interstitial pneumonitis or usual interstitial pneumonitis. Findings are indeterminate for UIP per consensus guidelines: Diagnosis of Idiopathic Pulmonary Fibrosis: An Official ATS/ERS/JRS/ALAT Clinical Practice Guideline. [place], Iss 5, [phone], May 13 2017. 2. Pulmonary nodules measure up to 4 mm. No follow-up needed if patient is low-risk (and has no known or suspected primary neoplasm). Non-contrast chest CT can be considered in 12 months if patient is high-risk. This recommendation follows the consensus statement: Guidelines for Management of Incidental Pulmonary Nodules Detected on CT Images: From the Fleischner Society 2017; Radiology 2017; 284:228-243. 3. Aortic atherosclerosis (ICD10-I70.0). Coronary artery calcification. 4. Enlarged pulmonic trunk, indicative of pulmonary arterial hypertension. 5.  Emphysema (ICD10-J43.9).   Electronically Signed   By: Lorin Picket M.D.   On: 07/15/2020 14:33    Results for IVER, MIKLAS (MRN [id number]) as of 08/25/2020 10:28  Ref. Range 07/23/2020 09:56 07/23/2020 09:58  Anti Nuclear Antibody (ANA) Latest Ref Range: NEGATIVE  POSITIVE (A)   ANA Pattern 1 Unknown Nuclear, Homogeneous (A)   ANA Titer 1 Latest Units: titer 9:89 (H)   Cyclic Citrullin Peptide Ab Latest Units: UNITS <16   RA Latex Turbid. Latest Ref Range: <14 IU/mL <14   ANA,IFA RA DIAG PNL W/RFLX TIT/PATN Unknown Rpt (A)   Anti-Jo-1 Ab (RDL) Latest Ref Range: <20 Units  <20  Anti-PL-7 Ab (RDL) Latest Ref Range: Negative   Negative  Anti-PL-12 Ab (RDL) Latest Ref Range: Negative   Negative  Anti-EJ Ab (RDL) Latest Ref Range: Negative   Negative  Anti-OJ Ab (RDL) Latest Ref Range: Negative   Negative  Anti-SRP Ab (RDL) Latest Ref Range: Negative   Negative  Anti-Mi-2 Ab (RDL) Latest Ref Range: Negative   Negative  Anti-TIF-1gamma Ab (RDL) Latest Ref Range: <20 Units  <20  Anti-MDA-5 Ab (CADM-140)(RDL) Latest Ref Range: <20 Units  <20  Anti-NXP-2 (P140) Ab (RDL) Latest Ref Range: <20 Units  <20  Anti-SAE1 Ab, IgG (RDL) Latest Ref Range: <20 Units  <20  Anti-PM/Scl-100 Ab (RDL) Latest Ref Range: <20 Units  <20  Anti-Ku Ab (RDL) Latest Ref Range: Negative   Negative   Anti-SS-A 52kD Ab, IgG (RDL) Latest Ref Range: <20 Units  <20  Anti-U1 RNP Ab (RDL) Latest Ref Range: <20 Units  <20  Anti-U2 RNP Ab (RDL) Latest Ref Range: Negative   Negative  Anti-U3 RNP (Fibrillarin)(RDL) Latest Ref Range: Negative   Negative  SSA (Ro) (ENA) Antibody, IgG Latest Ref Range: <1.0 NEG AI <1.0 NEG   SSB (La) (ENA) Antibody, IgG Latest Ref Range: <1.0 NEG AI <1.0 NEG   Scleroderma (Scl-70) (ENA) Antibody, IgG Latest Ref Range: <1.0 NEG AI <1.0 NEG     OV 10/28/2020  Subjective:  Patient ID: [name], male , DOB: Sep 09, 1941 , age 31 y.o. , MRN: [id number] , ADDRESS: [phone]  [address] [place] [address] PCP Hulan Fess, MD Patient Care Team: Hulan Fess, MD as PCP - General (Family Medicine) Sueanne Margarita, MD as PCP - Cardiology (Cardiology)  This Provider for this visit: Treatment Team:  Attending Provider: Brand Males, MD    10/28/2020 -   Chief Complaint  Patient presents with  . Follow-up    Get pft results.  Gets bloody nose when he blows his nose, small amount of blood, just since starting Esbriet.    IPF dx 08/25/2020: High PRob  clinical suspicion is you have IPF based on age greater than 5, male gender, Caucasian ethnicity, negative serology profile, history of acid reflux, previous exposure to smoke and copper and prior history of smoking and prob UIP on CT.  Start esbriet Januaryc 08, 2022 HPI [name] 80 y.o. -follow-up idiopathic pulmonary fibrosis new diagnosis December 2021. We started him on pirfenidone January 2022. He says he is tolerating it really well. His symptom score is stable. In fact he feels that pirfenidone is helping him. He feels his lungs have opened up. He feels that the antifibrotic actions have kicked in. He does admit this could be psychological. He did complain of some bloody nose when he blows up. He wondered if it is because of pirfenidone. Explained to him pirfenidone does not have any antiplatelet  agents. Explained that likely this is due to the cold winter and dry air. He shuttles between [place] and [place]. Did explain to him that if he were to relocate to [place] I would set him up with excellent ILD specialist at [facility]. We discussed patient support group. He seems interested. We discussed pulmonary rehabilitation but he has back and spine issues and knee issues that make it difficult. He wants to reflect on this. We discussed clinical trials as a care option including specifically all the details below. He wants to reflect on this but he feels that shuttling between [place] in [place] can be difficult to do clinical trials. However once is stable on the drug and sometimes past he might be able to do it.  There are no other new issues. Last visit I did indicate to him to consider stopping his omega-3 because of concern of omega-3 making acid reflux worse which is a risk factor for IPF. However he says it has helped his triglycerides. He wants to continue on this. His acid reflux is not super active. I have supported him in this decision.      SYMPTOM SCALE -  06/09/2020  08/25/2020  10/28/2020   O2 use ra ra ra  Shortness of Breath 0 -> 5 scale with 5 being worst (score 6 If unable to do)    At rest 0 0 0  Simple tasks - showers, clothes change, eating, shaving 1 0 1  Household (dishes, doing bed, laundry) _0 Shopping _1 Walking level at own pace _2 Walking up Stairs _3 Total (30-36) Dyspnea Score _4 How bad is your cough? _5 How bad is your fatigue _6 How bad is nausea 0 0 0  How bad is vomiting?  00 0 0  How bad is diarrhea? 0 0 0  How bad is anxiety? 0 0 0  How bad is depression 0 0 0       Simple office walk 185 feet x  3 laps goal with forehead probe 06/09/2020  08/25/2020  10/28/2020   O2 used ra  ra  Number laps completed 3  3  Comments about pace nl 100% and 64/min moderate  Resting Pulse Ox/HR 100% and 61/min   100% and 63  Final Pulse Ox/HR 97% and 98/min 98% and 80/min 99% and 100  Desaturated </= 88% no    Desaturated <= 3% points yes    Got Tachycardic >/= 90/min yes    Symptoms at end of test x  Mod desypnea  Miscellaneous comments x  Dyspnea started lap 1.NEver stopped     PFT  PFT Results Latest Ref Rng & Units 10/28/2020 04/02/2020  FVC-Pre L 3.69 3.82  FVC-Predicted Pre % 74 75  FVC-Post L 3.68 3.85  FVC-Predicted Post % 73 76  Pre FEV1/FVC % % 80 80  Post FEV1/FCV % % 83 83  FEV1-Pre L 2.94 3.04  FEV1-Predicted Pre % 81 83  FEV1-Post L 3.06 3.20  DLCO uncorrected ml/min/mmHg 21.84 23.34  DLCO UNC% % 76 81  DLCO corrected ml/min/mmHg 21.84 23.34  DLCO COR %Predicted % 76 81  DLVA Predicted % 99 103  TLC L 5.72 5.76  TLC % Predicted % 70 71  RV % Predicted % 72 72      OV 12/08/2020  Subjective:  Patient ID: [name], male , DOB: Jan 11, 1941 , age 89 y.o. , MRN: [id number] , ADDRESS: [address] [place] [address] PCP Hulan Fess, MD Patient Care Team: Hulan Fess, MD as PCP - General (Family Medicine) Sueanne Margarita, MD as PCP - Cardiology (Cardiology)  This Provider for this visit: Treatment Team:  Attending Provider: Brand Males, MD   IPF dx 08/25/2020 (reconfirmed MDD 11/17/20) : High PRob  clinical suspicion is you have IPF based on age greater than 59, male gender, Caucasian ethnicity, negative serology profile, history of acid reflux, previous exposure to smoke and copper and prior history of smoking and prob UIP on CT.   Start esbriet Januaryc 08, 2022   12/08/2020 -   Chief Complaint  Patient presents with  . Follow-up    Pt states he is doing okay since last visit and states breathing is about the same.     HPI [name] 80 y.o. -+ presents for follow-up.  Last visit was 1 month ago.  This visit was supposed to be a televisit but he showed up at the front door.  Therefore he preferred to do a face-to-face visit.  We  discussed in the clinical conference and confirmation is that he is IPF.  He says he is tolerating pirfenidone well.  He is going to start his third month of pirfenidone in the first week of April 2022.  He says occasionally bites his tongue at night.  This is associated with starting losartan 6 months ago but is not on ACE inhibitor.  He will discuss this with the cardiologist.  Also says that tongue biting started after the pirfenidone but it is not a known side effect of pirfenidone.  Overall he is feeling stable.  He is not interested in clinical trials of pulmonary rehabilitation at this point.  He shuttling between [place] in [place].  His symptom scores are listed below.         SYMPTOM SCALE -  06/09/2020  08/25/2020  10/28/2020  12/08/2020   O2 use ra ra ra ra  Shortness of Breath 0 -> 5 scale with 5 being worst (score 6 If unable to do)     At  rest 0 0 0 0  Simple tasks - showers, clothes change, eating, shaving 1 0 1 2  Household (dishes, doing bed, laundry) _0 Shopping _1 Walking level at own pace _2 Walking up Stairs _3 Total (30-36) Dyspnea Score _4 How bad is your cough? _5 How bad is your fatigue _6 How bad is nausea 0 0 0 0  How bad is vomiting?  00 0 0 0  How bad is diarrhea? 0 0 0 0  How bad is anxiety? 0 0 0 0  How bad is depression 0 0 0 0       Simple office walk 185 feet x  3 laps goal with forehead probe 06/09/2020  08/25/2020  10/28/2020   O2 used ra  ra  Number laps completed 3  3  Comments about pace nl 100% and 64/min moderate  Resting Pulse Ox/HR 100% and 61/min  100% and 63  Final Pulse Ox/HR 97% and 98/min 98% and 80/min 99% and 100  Desaturated </= 88% no    Desaturated <= 3% points yes    Got Tachycardic >/= 90/min yes    Symptoms at end of test x  Mod desypnea  Miscellaneous comments x  Dyspnea started lap 1.NEver stopped       PFT  PFT Results Latest Ref Rng & Units 10/28/2020  04/02/2020  FVC-Pre L 3.69 3.82  FVC-Predicted Pre % 74 75  FVC-Post L 3.68 3.85  FVC-Predicted Post % 73 76  Pre FEV1/FVC % % 80 80  Post FEV1/FCV % % 83 83  FEV1-Pre L 2.94 3.04  FEV1-Predicted Pre % 81 83  FEV1-Post L 3.06 3.20  DLCO uncorrected ml/min/mmHg 21.84 23.34  DLCO UNC% % 76 81  DLCO corrected ml/min/mmHg 21.84 23.34  DLCO COR %Predicted % 76 81  DLVA Predicted % 99 103  TLC L 5.72 5.76  TLC % Predicted % 70 71  RV % Predicted % 72 72       has a past medical history of Ascending aorta dilatation (HCC), Benign essential HTN (09/09/2013), Coronary artery disease (08/2013), Dyslipidemia, Elevated fasting glucose, Fatty liver, Glaucoma, Gout, Hyperlipidemia, Hypertension, Joint pain, LAD (lymphadenopathy), Obesity, Pure hypercholesterolemia (09/09/2013), PVD (peripheral vascular disease) ([place]) (06/12/2018), and Vision abnormalities.   reports that he quit smoking about 10 months ago. His smoking use included cigars. He quit after 10.00 years of use. He has never used smokeless tobacco.  Past Surgical History:  Procedure Laterality Date  . ADENOIDECTOMY    . CATARACT EXTRACTION    . CORONARY ANGIOPLASTY WITH STENT PLACEMENT  2009  . EYE SURGERY    . TONSILLECTOMY    . WISDOM TOOTH EXTRACTION      Allergies  Allergen Reactions  . Nsaids Other (See Comments)    Immunization History  Administered Date(s) Administered  . Fluad Quad(high Dose 65+) 05/13/2020  . Influenza, High Dose Seasonal PF 06/06/2019  . Influenza-Unspecified 06/08/2020  . PFIZER(Purple Top)SARS-COV-2 Vaccination 09/28/2019, 10/19/2019, 06/08/2020  . Pneumococcal Conjugate-13 10/23/2014  . Pneumococcal Polysaccharide-23 08/27/2012  . Tdap 08/27/2012  . Zoster 08/29/2012, 09/28/2019, 03/11/2020    Family History  Problem Relation Age of Onset  . Breast cancer Mother   . Heart attack Father   . Rheumatic fever Father   . CAD Sister   . Multiple sclerosis Sister   . CAD Brother   .  Neuropathy Neg Hx      Current Outpatient Medications:  .  allopurinol (ZYLOPRIM) 300 MG tablet, Take 300 mg by mouth daily., Disp: , Rfl:  .  aspirin 81 MG tablet, Take 81 mg by mouth daily., Disp: , Rfl:  .  atorvastatin (LIPITOR) 80 MG tablet, Take 1 tablet (80 mg total) by mouth daily., Disp: 90 tablet, Rfl: 3 .  dorzolamide-timolol (COSOPT) 22.3-6.8 MG/ML ophthalmic solution, Place 1 drop into both eyes 2 (two) times daily., Disp: , Rfl:  .  fluticasone (FLONASE) 50 MCG/ACT nasal spray, Place into the nose., Disp: , Rfl:  .  latanoprost (XALATAN) 0.005 % ophthalmic solution, Place 1 drop into both eyes daily., Disp: , Rfl:  .  loratadine (CLARITIN) 10 MG tablet, Take by mouth., Disp: , Rfl:  .  losartan (COZAAR) 100 MG tablet, Take 1 tablet (100 mg total) by mouth daily., Disp: 90 tablet, Rfl: 3 .  metoprolol succinate (TOPROL-XL) 100 MG 24 hr tablet, TAKE 1 TABLET DAILY WITH ORIMMEDIATELY FOLLOWING A    MEAL, Disp: 90 tablet, Rfl: 3 .  minocycline (MINOCIN,DYNACIN) 100 MG capsule, Take 100 mg by mouth as needed. For skin acne, Disp: , Rfl: 3 .  nitroGLYCERIN (NITROSTAT) 0.4 MG SL tablet, Place 0.4 mg under the tongue every 5 (five) minutes as needed for chest pain. , Disp: , Rfl:  .  Omega-3 Fatty Acids (FISH OIL) 1000 MG CAPS, Take 2,000 capsules by mouth in the morning and at bedtime. 1 capsules (2057m) by mouth two times a day, Disp: , Rfl:  .  pimecrolimus (ELIDEL) 1 % cream, APPLY TO AFFECTED AREA ON FACE TWICE A DAY AS NEEDED, Disp: , Rfl:  .  Pirfenidone (ESBRIET) 267 MG TABS, Take 3 tablets (801 mg total) by mouth with breakfast, with lunch, and with evening meal., Disp: 810 tablet, Rfl: 3 .  tamsulosin (FLOMAX) 0.4 MG CAPS capsule, Take 0.4 mg by mouth daily., Disp: , Rfl:  No current facility-administered medications for this visit.  Facility-Administered Medications Ordered in Other Visits:  .  technetium tetrofosmin (TC-MYOVIEW) injection 378.2millicurie, 342.3millicurie,  Intravenous, Once PRN, Turner, Traci R, MD      Objective:   Vitals:   12/08/20 1410  BP: 126/68  Pulse: 63  Temp: 97.6 F (36.4 C)  TempSrc: Temporal  SpO2: 100%  Weight: (!) 300 lb 3.2 oz (136.2 kg)  Height: _0  (1.905 m)    Estimated body mass index is 37.52 kg/m as calculated from the following:   Height as of this encounter: _1  (1.905 m).   Weight as of this encounter: 300 lb 3.2 oz (136.2 kg).  _2 @  Filed Weights   12/08/20 1410  Weight: (!) 300 lb 3.2 oz (136.2 kg)     Physical Exam General: No distress. obese Neuro: Alert and Oriented x 3. GCS 15. Speech normal Psych: Pleasant Resp:  Barrel Chest - no.  Wheeze - no, Crackles - no, No overt respiratory distress CVS: Normal heart sounds. Murmurs - no Ext: Stigmata of Connective Tissue Disease - no HEENT: Normal upper airway. PEERL +. No post nasal drip.  The tongue looks normal.        Assessment:       ICD-10-CM   1. IPF (idiopathic pulmonary fibrosis) ([facility]  J84.112   2. Encounter for therapeutic drug monitoring  Z51.81        Plan:     Patient Instructions     ICD-10-CM   1. IPF (idiopathic  pulmonary fibrosis) ([place])  J84.112   2. Encounter for therapeutic drug monitoring  Z51.81    Glad you are tolerating pirfenidone well. [Start date of pirfenidone January 2022] Pulmonary fibrosis is slowly progressed in 6 months but prior to the start of pirfenidone Glad you feel that pirfenidone seems to be helping you  Tongue biting probably not related to esbriet  Holding off rehab and clinical trials based on our discussion last month  Plan -Check liver function test today and then every month for the next 6 months -Email Marlane Mingle the support group leader on [email or web address]  -  Also visit [email or web address] to get more information --Okay to continue omega-3 because it is helping her triglycerides  Follow-up -Repeat liver function test in 4-6 weeks month either in  [place] or [place] can do a televisit with  nurse practitioner in 6 weeks to ensure esbriet tolerance going well - do spirometry and dlco in 12 weeks  - return to see Dr Chase Caller in 12 weeks in 30 min slot     SIGNATURE    Dr. Brand Males, M.D., F.C.C.P,  Pulmonary and Critical Care Medicine Staff Physician, [place] Director - Interstitial Lung Disease  Program  [facility] at [place], [place], [address]  Pager: [phone], If no answer or between  15:00h - 7:00h: call [phone] Telephone: [phone]  2:25 PM 12/08/2020

## 2021-01-12 ENCOUNTER — Ambulatory Visit (HOSPITAL_COMMUNITY): Payer: Medicare HMO | Attending: Cardiovascular Disease

## 2021-01-12 ENCOUNTER — Other Ambulatory Visit: Payer: Self-pay

## 2021-01-12 DIAGNOSIS — I77810 Thoracic aortic ectasia: Secondary | ICD-10-CM

## 2021-01-12 DIAGNOSIS — R0600 Dyspnea, unspecified: Secondary | ICD-10-CM

## 2021-01-12 DIAGNOSIS — R0609 Other forms of dyspnea: Secondary | ICD-10-CM

## 2021-01-12 LAB — ECHOCARDIOGRAM COMPLETE
Area-P 1/2: 3.65 cm2
P 1/2 time: 567 msec
S' Lateral: 3.3 cm

## 2021-01-19 ENCOUNTER — Ambulatory Visit: Payer: Medicare HMO | Admitting: Adult Health

## 2021-01-19 ENCOUNTER — Encounter: Payer: Self-pay | Admitting: Adult Health

## 2021-01-19 ENCOUNTER — Other Ambulatory Visit: Payer: Self-pay

## 2021-01-19 ENCOUNTER — Ambulatory Visit (INDEPENDENT_AMBULATORY_CARE_PROVIDER_SITE_OTHER): Payer: Medicare HMO | Admitting: Adult Health

## 2021-01-19 DIAGNOSIS — J84112 Idiopathic pulmonary fibrosis: Secondary | ICD-10-CM

## 2021-01-19 DIAGNOSIS — C44319 Basal cell carcinoma of skin of other parts of face: Secondary | ICD-10-CM | POA: Diagnosis not present

## 2021-01-19 DIAGNOSIS — L570 Actinic keratosis: Secondary | ICD-10-CM | POA: Diagnosis not present

## 2021-01-19 DIAGNOSIS — D044 Carcinoma in situ of skin of scalp and neck: Secondary | ICD-10-CM | POA: Diagnosis not present

## 2021-01-19 DIAGNOSIS — Z5181 Encounter for therapeutic drug level monitoring: Secondary | ICD-10-CM | POA: Diagnosis not present

## 2021-01-19 DIAGNOSIS — X32XXXD Exposure to sunlight, subsequent encounter: Secondary | ICD-10-CM | POA: Diagnosis not present

## 2021-01-19 LAB — HEPATIC FUNCTION PANEL
ALT: 38 U/L (ref 0–53)
AST: 31 U/L (ref 0–37)
Albumin: 4 g/dL (ref 3.5–5.2)
Alkaline Phosphatase: 61 U/L (ref 39–117)
Bilirubin, Direct: 0.2 mg/dL (ref 0.0–0.3)
Total Bilirubin: 0.5 mg/dL (ref 0.2–1.2)
Total Protein: 6.8 g/dL (ref 6.0–8.3)

## 2021-01-19 NOTE — Progress Notes (Signed)
Virtual Visit via Telephone Note  I connected with Marcus [name] on 01/19/21 at 12:00 PM EDT by telephone and verified that I am speaking with the correct person using two identifiers.  Location: Patient: Home  Provider: Office    I discussed the limitations, risks, security and privacy concerns of performing an evaluation and management service by telephone and the availability of in person appointments. I also discussed with the patient that there may be a patient responsible charge related to this service. The patient expressed understanding and agreed to proceed.   History of Present Illness: 81 yo former smoker followed for IPF Medical history significant for coronary artery disease, hypertension and hyperlipidemia.  Today's telemedicine visit is for a 6-week follow-up.  Patient has presumed IPF with interstitial changes consistent with UIP on high-resolution CT chest.  Patient was started on Esbriet in January 2022.  Patient says he is tolerating well.  He has had no nausea vomiting or diarrhea.  Appetite is good no weight loss.  Patient says he still gets short of breath with activities but tries to remain active.  He has minimum cough. Patient aware that he needs to return for lab work today.  Patient denies any flare of cough or shortness of breath.  No change in his activity level.  Past Medical History:  Diagnosis Date  . Ascending aorta dilatation (HCC)    40m gy echo 01/2020  . Benign essential HTN 09/09/2013  . Coronary artery disease 08/2013   S/P PCI of the LAD  . Dyslipidemia   . Elevated fasting glucose   . Fatty liver   . Glaucoma   . Gout   . Hyperlipidemia   . Hypertension   . Joint pain   . LAD (lymphadenopathy)   . Obesity   . Pure hypercholesterolemia 09/09/2013  . PVD (peripheral vascular disease) ([place] 06/12/2018    30-49% left common femoral stenosis  . Vision abnormalities    Current Outpatient Medications on File Prior to Visit  Medication Sig  Dispense Refill  . allopurinol (ZYLOPRIM) 300 MG tablet Take 300 mg by mouth daily.    .Marland Kitchenaspirin 81 MG tablet Take 81 mg by mouth daily.    .Marland Kitchenatorvastatin (LIPITOR) 80 MG tablet Take 1 tablet (80 mg total) by mouth daily. 90 tablet 3  . dextromethorphan-guaiFENesin (MUCINEX DM) 30-600 MG 12hr tablet Take 1 tablet by mouth 2 (two) times daily as needed for cough.    . dorzolamide-timolol (COSOPT) 22.3-6.8 MG/ML ophthalmic solution Place 1 drop into both eyes 2 (two) times daily.    . fluticasone (FLONASE) 50 MCG/ACT nasal spray Place into the nose.    . latanoprost (XALATAN) 0.005 % ophthalmic solution Place 1 drop into both eyes daily.    .Marland Kitchenloratadine (CLARITIN) 10 MG tablet Take by mouth.    . losartan (COZAAR) 100 MG tablet Take 1 tablet (100 mg total) by mouth daily. 90 tablet 3  . metoprolol succinate (TOPROL-XL) 100 MG 24 hr tablet TAKE 1 TABLET DAILY WITH ORIMMEDIATELY FOLLOWING A    MEAL 90 tablet 3  . minocycline (MINOCIN,DYNACIN) 100 MG capsule Take 100 mg by mouth as needed. For skin acne  3  . nitroGLYCERIN (NITROSTAT) 0.4 MG SL tablet Place 0.4 mg under the tongue every 5 (five) minutes as needed for chest pain.     . Omega-3 Fatty Acids (FISH OIL) 1000 MG CAPS Take 2,000 capsules by mouth in the morning and at bedtime. 1 capsules (20059m by mouth two times  a day    . pimecrolimus (ELIDEL) 1 % cream APPLY TO AFFECTED AREA ON FACE TWICE A DAY AS NEEDED    . Pirfenidone (ESBRIET) 267 MG TABS Take 3 tablets (801 mg total) by mouth with breakfast, with lunch, and with evening meal. 810 tablet 3  . tamsulosin (FLOMAX) 0.4 MG CAPS capsule Take 0.4 mg by mouth daily.     Current Facility-Administered Medications on File Prior to Visit  Medication Dose Route Frequency Provider Last Rate Last Admin  . technetium tetrofosmin (TC-MYOVIEW) injection 97.3 millicurie  53.2 millicurie Intravenous Once PRN Sueanne Margarita, MD          Observations/Objective:  pulmonary function test April 02, 2020 that shows mild restriction with FVC 3.82 L / 75% total lung capacity 5.76/71% but normal DLCO.   Assessment and Plan: IPF.  Appears clinically stable on current regimen.  Patient is tolerating Esbriet well.  Patient education on Esbriet given.  Patient to return today for LFTs.  And will get monthly LFTs.  He also is to follow back up in the office in 3 months with pulmonary function testing.  Plan  Patient Instructions  Continue on Esbriet .  Wear sunscreen when outside .  Return today for labs and monthly to our lab.  Activity as tolerated.      Follow Up Instructions:    I discussed the assessment and treatment plan with the patient. The patient was provided an opportunity to ask questions and all were answered. The patient agreed with the plan and demonstrated an understanding of the instructions.   The patient was advised to call back or seek an in-person evaluation if the symptoms worsen or if the condition fails to improve as anticipated.  I provided 22  minutes of non-face-to-face time during this encounter.   Rexene Edison, NP

## 2021-01-19 NOTE — Patient Instructions (Addendum)
Continue on Esbriet .  Wear sunscreen when outside .  Return today for labs and monthly to our lab.  Activity as tolerated.  Follow up with Dr. Chase Caller in 3 months with spirometry with DLCO .

## 2021-01-20 NOTE — Addendum Note (Signed)
Addended by: Vanessa Barbara on: 01/20/2021 09:18 AM   Modules accepted: Orders

## 2021-01-20 NOTE — Progress Notes (Signed)
Called and spoke with patient, advised of results/recommendations per Rexene Edison NP.  He states that he will be out of town from June 7th-21st.  Advised to just get the LFT checked around the one month time frame.  He verbalized understanding.  Nothing further needed.

## 2021-02-02 ENCOUNTER — Other Ambulatory Visit (HOSPITAL_COMMUNITY): Payer: Medicare HMO

## 2021-02-09 ENCOUNTER — Telehealth: Payer: Self-pay | Admitting: Adult Health

## 2021-02-09 NOTE — Telephone Encounter (Signed)
Called patient but he did not answer. Left message for him to call back.  

## 2021-02-09 NOTE — Telephone Encounter (Signed)
He should still stop esbriet. He needs video/face to face visit with an app next few days so they can look at rash themselves. Even brief sunscreen exposure without sunscreen can be harmful

## 2021-02-09 NOTE — Telephone Encounter (Signed)
Called and spoke with pt asking him if when he has gone outside if he has applied sunscreen and pt said that he hasn't. Pt said that when he goes outside, he is not usually in the sun that long so did not think that he should apply sunscreen.  MR, due to this, please advise if you still want pt to stop taking the Esbriet immediately.

## 2021-02-09 NOTE — Telephone Encounter (Signed)
Did he put sunscreen even when he went out for those brief exposures? I always tell my pateints that and typically they do not put sunscreen  Can he do a video or face to face visit with an app this week?  [facility] [place] [address] )  He needs to stop esbriet immediately  If this is sun exposed areas this is prob combo of esbriet + sun + no sunscreen

## 2021-02-09 NOTE — Telephone Encounter (Signed)
I called and spoke with pt.  He stated that he has been on the [place] for about 6 months.  He stated that in the last week his face and both arms have been red and are peeling.  His left hand is swollen.  He stated that he has not really been out in the sun for anymore than 15 mins.  He said that he normally has oily skin and since this started the skin has felt very dry.  He said that last night he felt a little bit of oil on his skin for the first time in over a week.    He is worried as they are going to [place] next week and he wanted to make sure that everything is ok before he goes.  MR please advise. Thanks

## 2021-02-16 NOTE — Telephone Encounter (Signed)
ATC x3.  Left detailed message per DPR.  Advised to call in the morning to schedule a visit.

## 2021-02-16 NOTE — Telephone Encounter (Signed)
lmtcb for pt.  

## 2021-02-26 ENCOUNTER — Telehealth: Payer: Self-pay | Admitting: Internal Medicine

## 2021-02-26 NOTE — Telephone Encounter (Signed)
Called and spoke with pt and he stated that he was in [place] and was not able to call our office.  He stated that he did go off of the esbriet.  He had swelling in both arms, peeling skin, sunburn, face was burned.  He stated that he had not really been out in the sun no more than 5 mins but did not put on sunscreen.  He did use sunblock while in [place].  He stated that his burn is gone and his swelling is gone.  MR did you want the pt to come in to be seen now?     Pt stated that he has a [place] cruise that is scheduled  and they have cancelled the cruise again.  He stated that they are trying to get the money back on this.  He is wanting to know if MR would write a letter that he not take the cruise due to his medical condition.  MR please advise. Thanks

## 2021-03-01 NOTE — Telephone Encounter (Signed)
Regarding [place] cruise: We can definitely write a letter supporting his cancellation.  I would like to know from him when the cruise was scheduled and what exact reason he wants [place] to put from a medical standpoint  Regarding the skin but I am a little bit confused - the burn he got in [place] in 5 min - was that with sunblock or without? And was he taking esbriet at that time?  Is he on esbriet now?

## 2021-03-01 NOTE — Telephone Encounter (Signed)
Spoke with pt who states note needs to state pt can not take cruise r/t hx of covid and pulmonary fibrosis. Wife of pt stated that they have been canceling cruises since summer of 2021 and note needs to state that pt has not been able to go on cruise since that time.   Pt states he was not on esbriet at time of sun burn which 2 weeks before going to [place] nor was he wearing sunblock at that time. Pt states he has not restarted esbriet. Pt states while taking esbriet in the past he did notice increase in constipation. Dr. Chase Caller please advise.

## 2021-03-04 DIAGNOSIS — H401133 Primary open-angle glaucoma, bilateral, severe stage: Secondary | ICD-10-CM | POA: Diagnosis not present

## 2021-03-09 NOTE — Telephone Encounter (Signed)
ATC, left detailed message in accordance with DPR regarding call. We have not heard back from Dr. Geoffery Spruce on letter for cruises from encounter on 06/17 but I will route the message to him again and reach out when we hear back and to call with other any questions/concerns and left callback number.  Dr. Chase Caller, please advise from encounter on 02/26/21. Thanks!

## 2021-03-10 ENCOUNTER — Encounter: Payer: Self-pay | Admitting: *Deleted

## 2021-03-10 NOTE — Telephone Encounter (Signed)
Called and spoke with pt letting him know the info stated by MR that it is okay for him to restart the [place] but that he needs to make sure he is wearing sunscreen when he goes outside and pt verbalized understanding.  Also stated to pt that MR said he was okay for a letter to be written for him to turn into the cruise line and he verbalized understanding. Letter has been written for pt and placed in mail. Nothing further needed.

## 2021-03-10 NOTE — Telephone Encounter (Signed)
Ok to restart esbriet but he has to wean sunscreen even if going out for few mint. Skin reactions unlileyu from esbreit based on the 2 phone notes and sequence. However, if this will be a challenge he can set a visit with app (tele or video or direct) to change to ofev   Also, please do medical letter for him to skip cruise citing his medical issues lincludin fibrosis and reduced mobilty because of that and covid exposure . An app has to sign it or have to wait till end of the week

## 2021-03-16 ENCOUNTER — Telehealth: Payer: Self-pay | Admitting: Cardiology

## 2021-03-16 MED ORDER — LOSARTAN POTASSIUM 100 MG PO TABS: 100.0000 mg | ORAL_TABLET | Freq: Every day | ORAL | 0 refills | Status: DC

## 2021-03-16 MED ORDER — ATORVASTATIN CALCIUM 80 MG PO TABS: 80.0000 mg | ORAL_TABLET | Freq: Every day | ORAL | 0 refills | Status: DC

## 2021-03-16 MED ORDER — METOPROLOL SUCCINATE ER 100 MG PO TB24: ORAL_TABLET | ORAL | 0 refills | Status: DC

## 2021-03-16 NOTE — Telephone Encounter (Signed)
*  STAT* If patient is at the pharmacy, call can be transferred to refill team.   1. Which medications need to be refilled? (please list name of each medication and dose if known)  losartan (COZAAR) 100 MG tablet metoprolol succinate (TOPROL-XL) 100 MG 24 hr tablet atorvastatin (LIPITOR) 80 MG tablet  2. Which pharmacy/location (including street and city if local pharmacy) is medication to be sent to? [facility] [place], [place] AT Portal to Registered [facility] Sites  3. Do they need a 30 day or 90 day supply? 90 day   Patient has a week left. Patient has an appt 10/12

## 2021-03-16 NOTE — Telephone Encounter (Signed)
Pt's medications were sent to pt's pharmacy as requested. Confirmation requested. Confirmation received.

## 2021-03-26 DIAGNOSIS — N400 Enlarged prostate without lower urinary tract symptoms: Secondary | ICD-10-CM | POA: Diagnosis not present

## 2021-03-26 DIAGNOSIS — Z8739 Personal history of other diseases of the musculoskeletal system and connective tissue: Secondary | ICD-10-CM | POA: Diagnosis not present

## 2021-03-26 DIAGNOSIS — N183 Chronic kidney disease, stage 3 unspecified: Secondary | ICD-10-CM | POA: Diagnosis not present

## 2021-03-26 DIAGNOSIS — M109 Gout, unspecified: Secondary | ICD-10-CM | POA: Diagnosis not present

## 2021-03-26 DIAGNOSIS — G629 Polyneuropathy, unspecified: Secondary | ICD-10-CM | POA: Diagnosis not present

## 2021-03-26 DIAGNOSIS — E782 Mixed hyperlipidemia: Secondary | ICD-10-CM | POA: Diagnosis not present

## 2021-03-26 DIAGNOSIS — I119 Hypertensive heart disease without heart failure: Secondary | ICD-10-CM | POA: Diagnosis not present

## 2021-03-26 DIAGNOSIS — Z0000 Encounter for general adult medical examination without abnormal findings: Secondary | ICD-10-CM | POA: Diagnosis not present

## 2021-03-26 DIAGNOSIS — I10 Essential (primary) hypertension: Secondary | ICD-10-CM | POA: Diagnosis not present

## 2021-03-29 DIAGNOSIS — H409 Unspecified glaucoma: Secondary | ICD-10-CM | POA: Diagnosis not present

## 2021-03-29 DIAGNOSIS — L719 Rosacea, unspecified: Secondary | ICD-10-CM | POA: Diagnosis not present

## 2021-03-29 DIAGNOSIS — E785 Hyperlipidemia, unspecified: Secondary | ICD-10-CM | POA: Diagnosis not present

## 2021-03-29 DIAGNOSIS — M109 Gout, unspecified: Secondary | ICD-10-CM | POA: Diagnosis not present

## 2021-03-29 DIAGNOSIS — I10 Essential (primary) hypertension: Secondary | ICD-10-CM | POA: Diagnosis not present

## 2021-03-29 DIAGNOSIS — L219 Seborrheic dermatitis, unspecified: Secondary | ICD-10-CM | POA: Diagnosis not present

## 2021-03-29 DIAGNOSIS — Z008 Encounter for other general examination: Secondary | ICD-10-CM | POA: Diagnosis not present

## 2021-03-29 DIAGNOSIS — J8410 Pulmonary fibrosis, unspecified: Secondary | ICD-10-CM | POA: Diagnosis not present

## 2021-03-29 DIAGNOSIS — G629 Polyneuropathy, unspecified: Secondary | ICD-10-CM | POA: Diagnosis not present

## 2021-03-29 DIAGNOSIS — I2510 Atherosclerotic heart disease of native coronary artery without angina pectoris: Secondary | ICD-10-CM | POA: Diagnosis not present

## 2021-03-30 ENCOUNTER — Telehealth: Payer: Self-pay | Admitting: Internal Medicine

## 2021-03-30 NOTE — Telephone Encounter (Signed)
Called and spoke with Patient.  Patient stated he is having another reaction with Esbriet.  Patient stated he had stopped Esbriet back in June, because he was having hand, feet swelling, pink/red sun burned looking skin. Patient stated Dr. Chase Caller wanted him to start back on Esbriet and use sunscreen. Patient stated he has been taking Esbriet a week and he is now having pink/red sunburn looking skin.  Patient stated is looks similar to sun poison, peeling, dry skin.  Patient stated he is not having any swelling at this time, but stated if he continues he will swell.  Patient stated he is using sunscreen daily, and has not really been outside much.   Message routed to Pharmacy [facility] Message routed to Dr. Chase Caller

## 2021-03-30 NOTE — Telephone Encounter (Signed)
Message routed to Dr. Silas Flood as Doc of the day

## 2021-03-30 NOTE — Telephone Encounter (Signed)
Called and spoke with patient to give him Dr. Daryl Eastern recommendations with the [place]. Advised him that we are going to send message to Dr. Chase Caller who would be back in on 04/06/21 to get further recommendations. Patient expressed understanding.   Routing to [place] to further recommendations.

## 2021-03-30 NOTE — Telephone Encounter (Signed)
Advise him to stop Esbriet for now. Photosensitivity and rash are known adverse reactions. Sunscreen has not helped.  Will route to Dr. Chase Caller for further instructions in the future.

## 2021-03-30 NOTE — Telephone Encounter (Signed)
Lm x1 for patient.

## 2021-04-08 NOTE — Telephone Encounter (Signed)
Attempted to call pt but unable to reach. Left message for him to return call.

## 2021-04-08 NOTE — Telephone Encounter (Signed)
Definitely stop esbriet List esbriet as allergy  How is he feeling now? Is it better because the msg came aprpox 10d ago.   Pls set up first aavail phone or face-face or video to discuss alternative option of ofev  Thanks    SIGNATURE    Dr. Brand Males, M.D., F.C.C.P,  Pulmonary and Critical Care Medicine Staff Physician, [place] Director - Interstitial Lung Disease  Program  Pulmonary [place] at [place], [place], [address]  NPI Number:  NPI #[id number]  Pager: [phone], If no answer  -> Check AMION or Try [phone] Telephone (clinical office): [phone] Telephone (research): [phone]  4:52 PM 04/08/2021  PS: based on review of prior notes - > seems last time he got skin rash he got it without being on esbriet (per 03/01/21 note on Gavin Potters)

## 2021-04-09 NOTE — Telephone Encounter (Signed)
Patient is returning phone call. Patient phone number is [phone].

## 2021-04-09 NOTE — Telephone Encounter (Signed)
Called and spoke with patient. He stated that his skin is feeling much better since he has stopped the Esbriet. He is still applying skin protectant. The swelling is gone as well. He is aware to not resume Esbriet and it will be added as an allergy.   I was able to get him scheduled with MR on 05/28/21 at 230pm. I did offer an appt with TP sooner but the patient has commitments in August that he can not miss.   Nothing further needed at time of call.

## 2021-04-12 DIAGNOSIS — H401123 Primary open-angle glaucoma, left eye, severe stage: Secondary | ICD-10-CM | POA: Diagnosis not present

## 2021-04-12 DIAGNOSIS — H401113 Primary open-angle glaucoma, right eye, severe stage: Secondary | ICD-10-CM | POA: Diagnosis not present

## 2021-05-10 DIAGNOSIS — H401123 Primary open-angle glaucoma, left eye, severe stage: Secondary | ICD-10-CM | POA: Diagnosis not present

## 2021-05-10 DIAGNOSIS — H401113 Primary open-angle glaucoma, right eye, severe stage: Secondary | ICD-10-CM | POA: Diagnosis not present

## 2021-05-24 ENCOUNTER — Other Ambulatory Visit: Payer: Self-pay | Admitting: Cardiology

## 2021-05-28 ENCOUNTER — Other Ambulatory Visit: Payer: Self-pay

## 2021-05-28 ENCOUNTER — Encounter: Payer: Self-pay | Admitting: Internal Medicine

## 2021-05-28 ENCOUNTER — Ambulatory Visit: Payer: Medicare HMO | Admitting: Internal Medicine

## 2021-05-28 VITALS — BP 130/74 | HR 66 | Temp 98.2°F | Ht 75.0 in | Wt 299.0 lb

## 2021-05-28 DIAGNOSIS — Z01811 Encounter for preprocedural respiratory examination: Secondary | ICD-10-CM | POA: Diagnosis not present

## 2021-05-28 DIAGNOSIS — J84112 Idiopathic pulmonary fibrosis: Secondary | ICD-10-CM | POA: Diagnosis not present

## 2021-05-28 DIAGNOSIS — Z5181 Encounter for therapeutic drug level monitoring: Secondary | ICD-10-CM

## 2021-05-28 DIAGNOSIS — L853 Xerosis cutis: Secondary | ICD-10-CM

## 2021-05-28 NOTE — Progress Notes (Signed)
IOV 06/09/2020  Subjective:  Patient ID: Marcus [name], male , DOB: Jul 07, 1941 , age 80 y.o. , MRN: [id number] , ADDRESS: [address] [place] [address]  PCP Hulan Fess, MD Cardiologist Dr. Golden Hurter   06/09/2020 -   Chief Complaint  Patient presents with   Consult    sob for the last year, worse for the last 6 month.     HPI Marcus [name] 80 y.o. -80 year old male with a history of atherosclerotic coronary artery disease [status post remote PCI of LAD], hypertension and hyperlipidemia.  History of transaminitis that resolved after stopping ethanol and Tylenol.  Lower extremity Dopplers in 2017 30-49% left common femoral arterial stenosis.  He saw Dr. Radford Pax cardiology in May 2021 with new onset of shortness of breath that is progressive.  Associated with exertional chest pressure.  At baseline he goes to [place] a lot and [place] 3 miles [place] the beach but as of May 2021 he was only able to walk 5 feet and had to stop halfway through that walk.  Also notices walking up stairs relieved by rest.  Overall dyspnea on exertion for 1 year.  Worse in the last 6 months but he also says that he some better in the last few months.  Denies any proximal nocturnal dyspnea orthopnea or lower extremity edema dizziness or palpitations or syncope.  Because of this he underwent nuclear medicine cardiac stress test February 14, 2020 that was low risk with ejection fraction 65%.  But he has had persistent symptoms and therefore has been referred here.  He then had pulmonary function test April 02, 2020 that shows mild restriction with FVC 3.82 L / 75% total lung capacity 5.76/71% but normal DLCO.  But because of persistent symptoms has been referred here.  Of note his echo showed grade 1 diastolic dysfunction.  Of note he also has a chronic cough during the same time.  -He tells me that recently his [facility] duct died out and after that the cough improved when the [facility] duct was off.  He is now  going to get his [facility] duct evaluated.    He also reports 10 pound weight gain during this time.  Normally is around 290 pounds.  Now he is at 300 pounds.  He is also been more sedentary because of the pandemic.  He says during this time his wife has noticed that he occasionally snores.  He does fall easily fatigued during the daytime.  He also has disrupted sleep pattern.  I did a stop bang questionnaire and it is 6 out of 8 suggesting significantly elevated risk for sleep apnea.  He has never had a sleep study.`  He is going back to [place], [place] where he owns another home.  He will not be able to do test in the next few weeks.   Results for [name], BRIDGE (MRN [id number]) as of 06/09/2020 14:16  Ref. Range 03/23/2017 09:13  Please Note (HCV): Unknown Comment  Anti Nuclear Antibody (ANA) Latest Ref Range: Negative  Negative  ANCA Proteinase 3 Latest Ref Range: 0.0 - 3.5 U/mL <3.5  Atypical pANCA Latest Ref Range: Neg:<1:20 titer <1:20  ENA SSA (RO) Ab Latest Ref Range: 0.0 - 0.9 AI <0.2  ENA SSB (LA) Ab Latest Ref Range: 0.0 - 0.9 AI <0.2  Deamidated Gliadin Abs, IgG Latest Ref Range: 0 - 19 units 2  Myeloperoxidase Ab Latest Ref Range: 0.0 - 9.0 U/mL <9.0  RA Latex  Turbid. Latest Ref Range: 0.0 - 13.9 IU/mL <10.0  Transglutaminase IgA Latest Ref Range: 0 - 3 U/mL <2  Cytoplasmic (C-ANCA) Latest Ref Range: Neg:<1:20 titer <1:20  P-ANCA Latest Ref Range: Neg:<1:20 titer <1:20  Antigliadin Abs, IgA Latest Ref Range: 0 - 19 units 6  IgG (Immunoglobin G), Serum Latest Ref Range: 700 - 1,600 mg/dL 825  IgM (Immunoglobulin M), Srm Latest Ref Range: 15 - 143 mg/dL 126  IgA/Immunoglobulin A, Serum Latest Ref Range: 61 - 437 mg/dL 312  Lyme IgG/IgM Ab Latest Ref Range: 0.00 - 0.90 ISR <0.91    NP visit Nov 2021   07/23/2020  - Visit   80 year old male former smoker followed in our office for dyspnea on exertion and chronic cough.  He is established with Dr. Chase Caller.  He was seen for an  initial consult in September/2021.  Plan of care from that consult with Dr. Chase Caller was as follows: Obtain high-resolution CT chest, do simple walk test, CBC with differential, IgE.  Stop bang score 6.  Was referred to Dr. Radford Pax for sleep study.  Patient presenting today as follow-up.  Patient reporting that he has been scheduled for home sleep study with cardiology on 08/11/2020.  Insurance would not pay for an in lab study.  Patient denies any sort of family history of lung disease.  He does report potential exposures when he was in the service for 2 years 1 of those years being in [place].  He reports a previous history of a primary care provider reporting that when he returned he may have had malaria and he was on malaria medications.  He is unsure of the validity of this.  Patient also worked in copper rod production for around 3 years.  He was around acid tanks for around 1 to 2 years during the rod washing.  He did not wear a mask.  He reports there is lots of smoke, copper dust as well as he to wear special close due to the exposures of the acid.  Patient is also currently dealing with an HVAC issue.  There is concerned there may be mold in this.  Both him and his wife have had respiratory symptoms of increased cough when the machine was running.  Is currently turned off and is being prepared to be cleaned.  He reports that the initial inspection by the HVAC team felt that there was no mold present.  Patient denies any persistent issues of acid reflux.  He does have a history of GERD and has a history of hiatal hernia.  He was treated with Nexium for around 6 to 8 weeks.  He reports he has been off this medication for many years.  He does not have breakthrough reflux symptoms or require Tums.  Patient continues to have aspects of shortness of breath occasionally.  Especially with physical exertion.  Patient also has an occasional cough.  Cough is more present in the morning when he is waking up.   Sometimes cough is productive.  He does have significant clear nasal drainage.  He is occasionally using a nasal medication that he believes is Flonase that his wife purchased over-the-counter.   OV 08/25/2020   Subjective:  Patient ID: Marcus [name], male , DOB: 06/12/1941, age 24 y.o. years. , MRN: [id number],  ADDRESS: [address] [place] [address] PCP  Hulan Fess, MD Providers : Treatment Team:  Attending Provider: Brand Males, MD Patient Care Team: Hulan Fess, MD as PCP - General (Family  Medicine) Sueanne Margarita, MD as PCP - Cardiology (Cardiology)    Chief Complaint  Patient presents with   Follow-up    ILD, doing ok, occ cough with some mucous       HPI Marcus [name] 80 y.o. -returns for follow-up to discuss his test results.  He is here with his wife who I am meeting for the first time.  His high-resolution CT chest is interpreted as indeterminate for UIP.  I personally visualized this I personally thought it was probable UIP.  I took a second opinion from Dr. Carilyn Goodpasture thoracic radiologist who feels that patient could get classified as probable UIP on the CT scan.  Long discussion was held with the patient but he did not bring his ILD questionnaire.  He had to bring this after the visit.  Therefore parts of this dictation happened after his visit but on the same day.  His main concern is that he will die prematurely.  [facility] Integrated Comprehensive ILD Questionnaire  Symptoms: He tells me that he had insidious onset of shortness of breath for the last 12 months.  Since it started it is the same.  He has level 1 dyspnea for household work, shopping and walking at a level pace.  Level 2 dyspnea on walking up stairs.  He does have difficulty keeping up with others of his age.  He has neuropathy and hip soreness.  He also has a cough since March 2021.  It is the same since it started moderate in severity.  It is dry.  He does clear his throat and he  does feel a tickle in his throat.  There is no hemoptysis or sputum production.  No wheezing no nausea no vomiting no diarrhea.  The cough is mostly in the morning.  His symptoms do feel better when he stops activity for 5 minutes.  It makes him feel more short of breath when he does vigorous activity.  No associated symptoms.   Past Medical History : He has a history of coronary artery disease with 1 stent. He is undergoing a sleep apnea work-up.  He has chronic kidney disease stage III according to his history. Malaria in [place]. Polio as a kid   He does not have rheumatoid arthritis.  No scleroderma no lupus no polymyositis no Sjogren's.  No other vasculitis.  No thyroid disease no diabetes.  No stroke no seizures.  No hepatitis no pneumonia.  His creatinine in May 2021 was 1.58 mg percent with a GFR of 42-48.  In October 2019 his creatinine was 1.35 mg percent.   Of note he says his dental hygienist has noted persistent redness in his hard palate.  He is asking me to refer him to ENT specialist and I have obliged  ROS: He does have arthralgia related to gout.  He has seborrhea.  He denies any oral ulcers or heartburn or vomiting or nausea.  Denies any weight loss.  He has obesity.  No recurrent fever no dryness.  No fatigue   FAMILY HISTORY of LUNG DISEASE: No pulmonary fibrosis no COPD.  His sister has autoimmune disease Brother has asthma   EXPOSURE HISTORY: He smokes cigarettes between 1960 and 1966 2 packs.  He also smoked cigars for 20 years.  No passive smoking.  No electronic cigarettes no marijuana no vaping no cocaine no intravenous drug use. Was      HOME and HOBBY DETAILS : Single-family home [place] for the last 10 years.  Age of the home is 25 years.  It is a 5 bedroom house.  There is no dampness.  No mold or mildew no humidifier use no CPAP use no nebulizer use.  No steam iron use no Jacuzzi use no misting Fountain.  No pet birds or parakeets no pet  gerbils or hamsters.  No feather pillows no mold in the [facility] duct.  No music habits.  No gardening habits.  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  During this time he was exposed to gas fumes and chemicals he reports that sulfuric acid copper out washing.  Otherwise negative for organic and inorganic antigens.   PULMONARY TOXICITY HISTORY (27 items): Negative     Radiology below were read by Dr. Lorin Picket is indeterminate for UIP.  My personal opinion this is probable UIP.  Dr. Carilyn Goodpasture radiologist can also concur with me it is probable UIP   Narrative & Impression  CLINICAL DATA:  Shortness of breath. Productive cough and shortness of breath exertion for approximately 1 year.   EXAM: CT CHEST WITHOUT CONTRAST   TECHNIQUE: Multidetector CT imaging of the chest was performed following the standard protocol without intravenous contrast. High resolution imaging of the lungs, as well as inspiratory and expiratory imaging, was performed.   COMPARISON:  None.   FINDINGS: Cardiovascular: Atherosclerotic calcification of the aorta, aortic valve and coronary arteries. Pulmonic trunk is enlarged. Heart is at the upper limits of normal in size to mildly enlarged. No pericardial effusion.   Mediastinum/Nodes: No pathologically enlarged mediastinal or axillary lymph nodes. Hilar regions are difficult to evaluate without IV contrast. Esophagus is unremarkable.   Lungs/Pleura: Mild basilar predominant subpleural reticulation, ground-glass and traction bronchiectasis/bronchiolectasis. Findings persist on prone imaging. No definitive honeycombing. No air trapping. 3 mm left upper lobe nodule (3/57). 4 mm peripheral left lower lobe nodule (3/106). Mild centrilobular emphysema. No pleural fluid. Airway is unremarkable.   Upper Abdomen: Visualized portions of the liver,  gallbladder, adrenal glands, kidneys, spleen, pancreas, stomach and bowel are grossly unremarkable. No upper abdominal adenopathy.   Musculoskeletal: Degenerative changes in the spine.   IMPRESSION: 1. Mild basilar predominant subpleural fibrosis may be due to nonspecific interstitial pneumonitis or usual interstitial pneumonitis. Findings are indeterminate for UIP per consensus guidelines: Diagnosis of Idiopathic Pulmonary Fibrosis: An Official ATS/ERS/JRS/ALAT Clinical Practice Guideline. [place], Iss 5, [phone], May 13 2017. 2. Pulmonary nodules measure up to 4 mm. No follow-up needed if patient is low-risk (and has no known or suspected primary neoplasm). Non-contrast chest CT can be considered in 12 months if patient is high-risk. This recommendation follows the consensus statement: Guidelines for Management of Incidental Pulmonary Nodules Detected on CT Images: From the Fleischner Society 2017; Radiology 2017; 284:228-243. 3. Aortic atherosclerosis (ICD10-I70.0). Coronary artery calcification. 4. Enlarged pulmonic trunk, indicative of pulmonary arterial hypertension. 5.  Emphysema (ICD10-J43.9).     Electronically Signed   By: Lorin Picket M.D.   On: 07/15/2020 14:33    Results for REINHARDT, LICAUSI (MRN [id number]) as of 08/25/2020 10:28  Ref. Range 07/23/2020 09:56 07/23/2020 09:58  Anti Nuclear Antibody (ANA) Latest Ref Range: NEGATIVE  POSITIVE (A)   ANA Pattern 1 Unknown Nuclear, Homogeneous (A)   ANA Titer 1 Latest Units: titer 5:78 (H)   Cyclic Citrullin Peptide Ab Latest Units: UNITS <16  RA Latex Turbid. Latest Ref Range: <14 IU/mL <14   ANA,IFA RA DIAG PNL W/RFLX TIT/PATN Unknown Rpt (A)   Anti-Jo-1 Ab (RDL) Latest Ref Range: <20 Units  <20  Anti-PL-7 Ab (RDL) Latest Ref Range: Negative   Negative  Anti-PL-12 Ab (RDL) Latest Ref Range: Negative   Negative  Anti-EJ Ab (RDL) Latest Ref Range: Negative   Negative  Anti-OJ Ab (RDL)  Latest Ref Range: Negative   Negative  Anti-SRP Ab (RDL) Latest Ref Range: Negative   Negative  Anti-Mi-2 Ab (RDL) Latest Ref Range: Negative   Negative  Anti-TIF-1gamma Ab (RDL) Latest Ref Range: <20 Units  <20  Anti-MDA-5 Ab (CADM-140)(RDL) Latest Ref Range: <20 Units  <20  Anti-NXP-2 (P140) Ab (RDL) Latest Ref Range: <20 Units  <20  Anti-SAE1 Ab, IgG (RDL) Latest Ref Range: <20 Units  <20  Anti-PM/Scl-100 Ab (RDL) Latest Ref Range: <20 Units  <20  Anti-Ku Ab (RDL) Latest Ref Range: Negative   Negative  Anti-SS-A 52kD Ab, IgG (RDL) Latest Ref Range: <20 Units  <20  Anti-U1 RNP Ab (RDL) Latest Ref Range: <20 Units  <20  Anti-U2 RNP Ab (RDL) Latest Ref Range: Negative   Negative  Anti-U3 RNP (Fibrillarin)(RDL) Latest Ref Range: Negative   Negative  SSA (Ro) (ENA) Antibody, IgG Latest Ref Range: <1.0 NEG AI <1.0 NEG   SSB (La) (ENA) Antibody, IgG Latest Ref Range: <1.0 NEG AI <1.0 NEG   Scleroderma (Scl-70) (ENA) Antibody, IgG Latest Ref Range: <1.0 NEG AI <1.0 NEG     OV 10/28/2020  Subjective:  Patient ID: Marcus [name], male , DOB: 04/23/1941 , age 47 y.o. , MRN: [id number] , ADDRESS: [address] [place] [address] PCP Hulan Fess, MD Patient Care Team: Hulan Fess, MD as PCP - General (Family Medicine) Sueanne Margarita, MD as PCP - Cardiology (Cardiology)  This Provider for this visit: Treatment Team:  Attending Provider: Brand Males, MD    10/28/2020 -   Chief Complaint  Patient presents with   Follow-up    Get pft results.  Gets bloody nose when he blows his nose, small amount of blood, just since starting Esbriet.    IPF dx 08/25/2020: High PRob  clinical suspicion is you have IPF based on age greater than 45, male gender, Caucasian ethnicity, negative serology profile, history of acid reflux, previous exposure to smoke and copper and prior history of smoking and prob UIP on CT.  Start esbriet Januaryc 08, 2022 HPI Marcus [name] 80 y.o. -follow-up  idiopathic pulmonary fibrosis new diagnosis December 2021. We started him on pirfenidone January 2022. He says he is tolerating it really well. His symptom score is stable. In fact he feels that pirfenidone is helping him. He feels his lungs have opened up. He feels that the antifibrotic actions have kicked in. He does admit this could be psychological. He did complain of some bloody nose when he blows up. He wondered if it is because of pirfenidone. Explained to him pirfenidone does not have any antiplatelet agents. Explained that likely this is due to the cold winter and dry air. He shuttles between [place] and [place]. Did explain to him that if he were to relocate to [place] I would set him up with excellent ILD specialist at [facility]. We discussed patient support group. He seems interested. We discussed pulmonary rehabilitation but he has back and spine issues and knee issues that make it difficult. He wants to reflect on this. We discussed clinical trials as a care  option including specifically all the details below. He wants to reflect on this but he feels that shuttling between [place] in [place] can be difficult to do clinical trials. However once is stable on the drug and sometimes past he might be able to do it.  There are no other new issues. Last visit I did indicate to him to consider stopping his omega-3 because of concern of omega-3 making acid reflux worse which is a risk factor for IPF. However he says it has helped his triglycerides. He wants to continue on this. His acid reflux is not super active. I have supported him in this decision.     PFT    OV 12/08/2020  Subjective:  Patient ID: Marcus [name], male , DOB: 13-Jan-1941 , age 29 y.o. , MRN: [id number] , ADDRESS: [address] [place] [address] PCP Hulan Fess, MD Patient Care Team: Hulan Fess, MD as PCP - General (Family Medicine) Sueanne Margarita, MD as PCP - Cardiology (Cardiology)  This  Provider for this visit: Treatment Team:  Attending Provider: Brand Males, MD   IPF dx 08/25/2020 (reconfirmed MDD 11/17/20) : High PRob  clinical suspicion is you have IPF based on age greater than 42, male gender, Caucasian ethnicity, negative serology profile, history of acid reflux, previous exposure to smoke and copper and prior history of smoking and prob UIP on CT.   Start esbriet Jory Sims 08, 2022   12/08/2020 -   Chief Complaint  Patient presents with   Follow-up    Pt states he is doing okay since last visit and states breathing is about the same.     HPI Marcus [name] 80 y.o. -+ presents for follow-up.  Last visit was 1 month ago.  This visit was supposed to be a televisit but he showed up at the front door.  Therefore he preferred to do a face-to-face visit.  We discussed in the clinical conference and confirmation is that he is IPF.  He says he is tolerating pirfenidone well.  He is going to start his third month of pirfenidone in the first week of April 2022.  He says occasionally bites his tongue at night.  This is associated with starting losartan 6 months ago but is not on ACE inhibitor.  He will discuss this with the cardiologist.  Also says that tongue biting started after the pirfenidone but it is not a known side effect of pirfenidone.  Overall he is feeling stable.  He is not interested in clinical trials of pulmonary rehabilitation at this point.  He shuttling between [place] in [place].  His symptom scores are listed below.    Telel visit 01/19/21  80 yo former smoker followed for IPF Medical history significant for coronary artery disease, hypertension and hyperlipidemia.  Today's telemedicine visit is for a 6-week follow-up.  Patient has presumed IPF with interstitial changes consistent with UIP on high-resolution CT chest.  Patient was started on Esbriet in January 2022.  Patient says he is tolerating well.  He has had no nausea vomiting or diarrhea.   Appetite is good no weight loss.  Patient says he still gets short of breath with activities but tries to remain active.  He has minimum cough. Patient aware that he needs to return for lab work today.  Patient denies any flare of cough or shortness of breath.  No change in his activity level.    OV 05/28/2021  Subjective:  Patient ID: Marcus [name], male , DOB: 1941-05-24 , age  76 y.o. , MRN: [id number] , ADDRESS: [address] [place] [address] PCP Hulan Fess, MD Patient Care Team: Hulan Fess, MD as PCP - General (Family Medicine) Sueanne Margarita, MD as PCP - Cardiology (Cardiology)  This Provider for this visit: Treatment Team:  Attending Provider: Brand Males, MD    05/28/2021 -   Chief Complaint  Patient presents with   Follow-up    Pt states he has been doing okay since last visit. States that he stopped taking Esbriet about a month ago. Pt states that his breathing is about the same.    IPF dx 08/25/2020 (reconfirmed MDD 11/17/20) : High PRob  clinical suspicion is you have IPF based on age greater than 53, male gender, Caucasian ethnicity, negative serology profile, history of acid reflux, previous exposure to smoke and copper and prior history of smoking and prob UIP on CT.   Start esbriet Januaryc 08, 2022 - stopped summer 2022 afte sun rash x 2 times   Starting ofev  HPI Orvill Sweaney 80 y.o. -        SYMPTOM SCALE -  06/09/2020  08/25/2020  10/28/2020  12/08/2020   O2 use ra ra ra ra  Shortness of Breath 0 -> 5 scale with 5 being worst (score 6 If unable to do)     At rest 0 0 0 0  Simple tasks - showers, clothes change, eating, shaving 1 0 1 2  Household (dishes, doing bed, laundry) _0 Shopping _1 Walking level at own pace _2 Walking up Stairs _3 Total (30-36) Dyspnea Score _4 How bad is your cough? _5 How bad is your fatigue _6 How bad is nausea 0 0 0 0  How bad is vomiting?  00 0 0  0  How bad is diarrhea? 0 0 0 0  How bad is anxiety? 0 0 0 0  How bad is depression 0 0 0 0       Simple office walk 185 feet x  3 laps goal with forehead probe 06/09/2020  08/25/2020  10/28/2020  05/28/2021   O2 used ra  ra ra  Number laps completed _7 Comments about pace nl 100% and 64/min moderate   Resting Pulse Ox/HR 100% and 61/min  100% and 63 100% and 66  Final Pulse Ox/HR 97% and 98/min 98% and 80/min 99% and 100 98% and 1112  Desaturated </= 88% no     Desaturated <= 3% points yes     Got Tachycardic >/= 90/min yes     Symptoms at end of test x  Mod desypnea   Miscellaneous comments x  Dyspnea started lap 1.NEver stopped      PFT  PFT Results Latest Ref Rng & Units 10/28/2020 04/02/2020  FVC-Pre L 3.69 3.82  FVC-Predicted Pre % 74 75  FVC-Post L 3.68 3.85  FVC-Predicted Post % 73 76  Pre FEV1/FVC % % 80 80  Post FEV1/FCV % % 83 83  FEV1-Pre L 2.94 3.04  FEV1-Predicted Pre % 81 83  FEV1-Post L 3.06 3.20  DLCO uncorrected ml/min/mmHg 21.84 23.34  DLCO UNC% % 76 81  DLCO corrected ml/min/mmHg 21.84 23.34  DLCO COR %Predicted % 76 81  DLVA Predicted % 99 103  TLC L 5.72 5.76  TLC % Predicted % 70 71  RV %  Predicted % 72 72       has a past medical history of Ascending aorta dilatation (HCC), Benign essential HTN (09/09/2013), Coronary artery disease (08/2013), Dyslipidemia, Elevated fasting glucose, Fatty liver, Glaucoma, Gout, Hyperlipidemia, Hypertension, Joint pain, LAD (lymphadenopathy), Obesity, Pure hypercholesterolemia (09/09/2013), PVD (peripheral vascular disease) ([place]) (06/12/2018), and Vision abnormalities.   reports that he quit smoking about 15 months ago. His smoking use included cigars. He has never used smokeless tobacco.  Past Surgical History:  Procedure Laterality Date   ADENOIDECTOMY     CATARACT EXTRACTION     CORONARY ANGIOPLASTY WITH STENT PLACEMENT  2009   EYE SURGERY     TONSILLECTOMY     WISDOM TOOTH EXTRACTION      No  Known Allergies  Immunization History  Administered Date(s) Administered   Fluad Quad(high Dose 65+) 05/13/2020   Influenza, High Dose Seasonal PF 06/06/2019   Influenza-Unspecified 06/08/2020   PFIZER(Purple Top)SARS-COV-2 Vaccination 09/28/2019, 10/19/2019, 06/08/2020, 02/05/2021   Pneumococcal Conjugate-13 10/23/2014   Pneumococcal Polysaccharide-23 08/27/2012   Tdap 08/27/2012   Zoster, Live 08/29/2012, 09/28/2019, 03/11/2020    Family History  Problem Relation Age of Onset   Breast cancer Mother    Heart attack Father    Rheumatic fever Father    CAD Sister    Multiple sclerosis Sister    CAD Brother    Neuropathy Neg Hx      Current Outpatient Medications:    allopurinol (ZYLOPRIM) 300 MG tablet, Take 300 mg by mouth daily., Disp: , Rfl:    aspirin 81 MG tablet, Take 81 mg by mouth daily., Disp: , Rfl:    atorvastatin (LIPITOR) 80 MG tablet, TAKE 1 TABLET DAILY, Disp: 90 tablet, Rfl: 0   dorzolamide-timolol (COSOPT) 22.3-6.8 MG/ML ophthalmic solution, Place 1 drop into both eyes 2 (two) times daily., Disp: , Rfl:    fluticasone (FLONASE) 50 MCG/ACT nasal spray, Place into the nose., Disp: , Rfl:    latanoprost (XALATAN) 0.005 % ophthalmic solution, Place 1 drop into both eyes daily., Disp: , Rfl:    losartan (COZAAR) 100 MG tablet, TAKE 1 TABLET DAILY, Disp: 90 tablet, Rfl: 0   metoprolol succinate (TOPROL-XL) 100 MG 24 hr tablet, TAKE 1 TABLET DAILY WITH ORIMMEDIATELY FOLLOWING A    MEAL, Disp: 90 tablet, Rfl: 0   minocycline (MINOCIN,DYNACIN) 100 MG capsule, Take 100 mg by mouth as needed. For skin acne, Disp: , Rfl: 3   nitroGLYCERIN (NITROSTAT) 0.4 MG SL tablet, Place 0.4 mg under the tongue every 5 (five) minutes as needed for chest pain. , Disp: , Rfl:    Omega-3 Fatty Acids (FISH OIL) 1000 MG CAPS, Take 2,000 capsules by mouth in the morning and at bedtime. 1 capsules (2066m) by mouth two times a day, Disp: , Rfl:    pimecrolimus (ELIDEL) 1 % cream, APPLY TO  AFFECTED AREA ON FACE TWICE A DAY AS NEEDED, Disp: , Rfl:    tamsulosin (FLOMAX) 0.4 MG CAPS capsule, Take 0.4 mg by mouth daily., Disp: , Rfl:  No current facility-administered medications for this visit.  Facility-Administered Medications Ordered in Other Visits:    technetium tetrofosmin (TC-MYOVIEW) injection 347.4millicurie, 325.9millicurie, Intravenous, Once PRN, Turner, Traci R, MD      Objective:   Vitals:   05/28/21 1440  BP: 130/74  Pulse: 66  Temp: 98.2 F (36.8 C)  TempSrc: Oral  SpO2: 100%  Weight: 299 lb (135.6 kg)  Height: _0  (1.905 m)    Estimated body mass index  is 37.37 kg/m as calculated from the following:   Height as of this encounter: _0  (1.905 m).   Weight as of this encounter: 299 lb (135.6 kg).  _1 @  Filed Weights   05/28/21 1440  Weight: 299 lb (135.6 kg)     Physical Exam    General: No distress. obese Neuro: Alert and Oriented x 3. GCS 15. Speech normal Psych: Pleasant Resp:  Barrel Chest - no.  Wheeze - no, Crackles - yes at base, No overt respiratory distress CVS: Normal heart sounds. Murmurs - no Ext: Stigmata of Connective Tissue Disease - no HEENT: Normal upper airway. PEERL +. No post nasal drip        Assessment:       ICD-10-CM   1. IPF (idiopathic pulmonary fibrosis) ([place])  J84.112     2. Encounter for therapeutic drug monitoring  Z51.81     3. Dry skin  L85.3     4. Preop respiratory exam  Z01.811          Plan:     Patient Instructions     ICD-10-CM   1. IPF (idiopathic pulmonary fibrosis) ([place])  J84.112     2. Encounter for therapeutic drug monitoring  Z51.81     3. Dry skin  L85.3     4. Preop respiratory exam  Z01.811       Clinically stable pulmonary fibrosis based on symptoms and also walking desaturation test Pirfenidone caused skin rash -definitely agree on not taking it anymore You have a self-reported history of dry skin You have upcoming eye surgery for glaucoma in  October and November 2022  Plan [based on shared decision making] - Refer pharmacist Knox Saliva -to start nintedanib  = This is to be taken 2 times daily with food  -Dose is 150 mg twice daily  -Diarrhea is the most common side effect  -Rare uncommon side effects include concern for potential heart attack risk, diverticulitis  -Overall benefit outweighs the risk  -Benefit is in trying to prevent progression of pulmonary fibrosis  -Agree starting the drug after completing eye surgery  -But you can see the pharmacist now and get the paperwork started  -Hold off on breathing test for the next few to several months because of high surgery and positive pressure coming from the breathing test  - low risk for pulmonary complications from eye surgery  Follow-up - December 2022 to 30-minute visit with Dr. Chase Caller  -ILD symptom score and simple walking desaturation test at follow-up  ( Level 05 visit: Estb 40-54 min    in  visit type: on-site physical face to visit  in total care time and counseling or/and coordination of care by this undersigned MD - Dr Brand Males. This includes one or more of the following on this same day 05/28/2021: pre-charting, chart review, note writing, documentation discussion of test results, diagnostic or treatment recommendations, prognosis, risks and benefits of management options, instructions, education, compliance or risk-factor reduction. It excludes time spent by the [place] or office staff in the care of the patient. Actual time 39 min)   SIGNATURE    Dr. Brand Males, M.D., F.C.C.P,  Pulmonary and Critical Care Medicine Staff Physician, [place] Director - Interstitial Lung Disease  Program  Pulmonary [place] [facility] [place], [place], [address]  Pager: [phone], If no answer or between  15:00h - 7:00h: call [phone] Telephone: [phone]  5:42 PM 05/28/2021

## 2021-05-28 NOTE — Patient Instructions (Addendum)
ICD-10-CM   1. IPF (idiopathic pulmonary fibrosis) ([place])  J84.112     2. Encounter for therapeutic drug monitoring  Z51.81     3. Dry skin  L85.3     4. Preop respiratory exam  Z01.811       Clinically stable pulmonary fibrosis based on symptoms and also walking desaturation test Pirfenidone caused skin rash -definitely agree on not taking it anymore You have a self-reported history of dry skin You have upcoming eye surgery for glaucoma in October and November 2022  Plan [based on shared decision making] - Refer pharmacist Knox Saliva -to start nintedanib  = This is to be taken 2 times daily with food  -Dose is 150 mg twice daily  -Diarrhea is the most common side effect  -Rare uncommon side effects include concern for potential heart attack risk, diverticulitis  -Overall benefit outweighs the risk  -Benefit is in trying to prevent progression of pulmonary fibrosis  -Agree starting the drug after completing eye surgery  -But you can see the pharmacist now and get the paperwork started  -Hold off on breathing test for the next few to several months because of high surgery and positive pressure coming from the breathing test  - low risk for pulmonary complications from eye surgery  Follow-up - December 2022 to 30-minute visit with Dr. Chase Caller  -ILD symptom score and simple walking desaturation test at follow-up

## 2021-06-01 ENCOUNTER — Telehealth: Payer: Self-pay

## 2021-06-01 ENCOUNTER — Other Ambulatory Visit (HOSPITAL_COMMUNITY): Payer: Self-pay

## 2021-06-01 DIAGNOSIS — Z5181 Encounter for therapeutic drug level monitoring: Secondary | ICD-10-CM

## 2021-06-01 DIAGNOSIS — J84112 Idiopathic pulmonary fibrosis: Secondary | ICD-10-CM

## 2021-06-01 NOTE — Telephone Encounter (Signed)
Received New start paperwork for OFEV. Will update as we work through the benefits process.  Submitted a Prior Authorization request to [facility] for OFEV via CoverMyMeds. Will update once we receive a response.   Key: [id number]

## 2021-06-02 ENCOUNTER — Other Ambulatory Visit (HOSPITAL_COMMUNITY): Payer: Self-pay

## 2021-06-02 NOTE — Telephone Encounter (Signed)
Received notification from [facility] regarding a prior authorization for [place]. Authorization has been APPROVED from 09/12/2020 to 09/11/2021.   Per test claim, copay for 30 days supply is $3,052.87  Patient can fill through [place]: [phone]   Authorization # [id number]  We are still awaiting the return of pt portion and income documents, will reach out.

## 2021-06-04 NOTE — Telephone Encounter (Signed)
Spoke to patient regarding pt portion and income documents. Advised patient on specific forms necessary for submission and answered questions related to PAP form. Pt requested to email documents. Email address provided along with direct phone number. Informed patient that he may also interact with Devki and Rachael if I am unavailable, and that the VM box was secure. Will await return of documents.

## 2021-06-10 NOTE — Telephone Encounter (Signed)
Spoke to patient, he advised that he sent [employer] his portion of his application and income documents via email. Sent [employer] a message to please forward email to me. Will follow up.

## 2021-06-10 NOTE — Telephone Encounter (Signed)
Spoke with patient about income documents. Reviewed that hte 1040 he submitted automatically disqualified him from [facility] patient assistance. I advised him to collect SS Benefits letter and pension statement to submit rather than 1040. He will gather and email back to pharmacy team. Provided him phone number for SS office to request copy of letters for he and his wife if needed. He states he will need until next week to gather documents.  Knox Saliva, PharmD, MPH, BCPS Clinical Pharmacist (Rheumatology and Pulmonology)

## 2021-06-14 NOTE — Telephone Encounter (Signed)
Submitted Patient Assistance Application to [facility] for [place] along with provider portion, patient portion, PA and income documents. Will update patient when we receive a response.  Fax# [phone] Phone# [phone]  Knox Saliva, PharmD, MPH, BCPS Clinical Pharmacist (Rheumatology and Pulmonology)

## 2021-06-22 DIAGNOSIS — H401113 Primary open-angle glaucoma, right eye, severe stage: Secondary | ICD-10-CM | POA: Diagnosis not present

## 2021-06-22 DIAGNOSIS — H401133 Primary open-angle glaucoma, bilateral, severe stage: Secondary | ICD-10-CM | POA: Diagnosis not present

## 2021-06-22 NOTE — Telephone Encounter (Signed)
Called [employer], rep Ashlee advised the prescription on the application was missing directions. Will complete and refax.

## 2021-06-23 ENCOUNTER — Ambulatory Visit: Payer: Medicare HMO | Admitting: Cardiology

## 2021-06-28 NOTE — Telephone Encounter (Signed)
Received notification from  [facility] regarding an approval for [place] patient assistance from 06/23/21 to 09/11/21. They attemped to contact patient to schedule shipment, no answer, left message.  Phone number: [phone]

## 2021-06-30 NOTE — Telephone Encounter (Signed)
ATC patient to notify of Ofev approval through patient assistance but unable to reach. Left VM requesting return call with direct phone number to pharmacy office  Knox Saliva, PharmD, MPH, BCPS Clinical Pharmacist (Rheumatology and Pulmonology)

## 2021-07-02 NOTE — Telephone Encounter (Signed)
Patient returned call. Provided him with [facility] phone number to set up his 1st shipment. He would still like for [facility] to call him next week to discuss the medication again.

## 2021-07-05 MED ORDER — OFEV 150 MG PO CAPS: 150.0000 mg | ORAL_CAPSULE | Freq: Two times a day (BID) | ORAL | 2 refills | Status: DC

## 2021-07-05 NOTE — Telephone Encounter (Signed)
I have called the patient to make a follow up with provider in 6 weeks. Nothing further needed.

## 2021-07-05 NOTE — Telephone Encounter (Signed)
Called patient to review Ofev in detail since approval through [facility] patient assistance. He states he has first Ofev shipment scheduled to be received on 10/27 or 07/09/21 of this week  He previously took Esbriet but had photosensitivity and rash despite sunscreen use    LFT's Hepatic Function Latest Ref Rng & Units 01/19/2021 12/08/2020 10/28/2020  Total Protein 6.0 - 8.3 g/dL 6.8 6.5 7.1  Albumin 3.5 - 5.2 g/dL 4.0 3.7 3.9  AST 0 - 37 U/L 31 28 32  ALT 0 - 53 U/L 38 37 50  Alk Phosphatase 39 - 117 U/L 61 73 60  Total Bilirubin 0.2 - 1.2 mg/dL 0.5 0.5 0.3  Bilirubin, Direct 0.0 - 0.3 mg/dL 0.2 0.1 0.1     HRCT (07/15/20) - Mild basilar predominant subpleural fibrosis may be due to nonspecific interstitial pneumonitis or usual interstitial pneumonitis. Findings are indeterminate for UIP per consensus guidelines  Assessment and Plan  Ofev Medication Management Thoroughly counseled patient on the efficacy, mechanism of action, dosing, administration, adverse effects, and monitoring parameters of Ofev. Patient verbalized understanding. Patient education handout provided.   Goals of Therapy: Will not stop or reverse the progression of ILD. It will slow the progression of ILD.  Dosing: 150 mg (one capsule) by mouth twice daily (approx 12 hours apart). Discussed taking with food approximately 12 hours apart. Discussed that capsule should not be crushed or split.  Adverse Effects: Nausea, vomiting, diarrhea (2 in 3 patients) appetite loss, weight loss - management of diarrhea with loperamide discussed including max use of 48 hours and max of 8 capsules per day. Abdominal pain (up to 1 in 5 patients) Nasopharyngitis (13%), UTI (6%) - reviewed this Risk of thrombosis (3%) and acute MI (2%) - reviewed this Hypertension (5%) - has blood pressure monitoring system at home and states his BP is well-controlled - advised to measure once to twice weekly moving forward Dizziness Fatigue  (10%)  Monitoring: Monitor for diarrhea, nausea and vomiting, GI perforation, hepatotoxicity  Monitor LFTs - baseline, monthly for first 6 months, then every 3 months routinely CBC w differential at baseline and every 3 months routinely  MyChart message sent with information for [facility] nursing support/education program. Patient highly encouraged to enroll.   Knox Saliva, PharmD, MPH, BCPS Clinical Pharmacist (Rheumatology and Pulmonology)

## 2021-07-06 ENCOUNTER — Other Ambulatory Visit: Payer: Self-pay | Admitting: Pulmonary Disease

## 2021-07-06 DIAGNOSIS — J849 Interstitial pulmonary disease, unspecified: Secondary | ICD-10-CM

## 2021-07-27 ENCOUNTER — Other Ambulatory Visit: Payer: Self-pay

## 2021-07-27 ENCOUNTER — Ambulatory Visit: Payer: Medicare HMO | Admitting: Cardiology

## 2021-07-27 ENCOUNTER — Ambulatory Visit (INDEPENDENT_AMBULATORY_CARE_PROVIDER_SITE_OTHER)
Admission: RE | Admit: 2021-07-27 | Discharge: 2021-07-27 | Disposition: A | Discharge status: Home / Self Care | Payer: Medicare HMO | Source: Ambulatory Visit | Attending: Internal Medicine | Admitting: Internal Medicine

## 2021-07-27 ENCOUNTER — Encounter: Payer: Self-pay | Admitting: Cardiology

## 2021-07-27 VITALS — BP 148/72 | HR 71

## 2021-07-27 DIAGNOSIS — I700 Atherosclerosis of aorta: Secondary | ICD-10-CM | POA: Diagnosis not present

## 2021-07-27 DIAGNOSIS — E7800 Pure hypercholesterolemia, unspecified: Secondary | ICD-10-CM | POA: Diagnosis not present

## 2021-07-27 DIAGNOSIS — I739 Peripheral vascular disease, unspecified: Secondary | ICD-10-CM | POA: Diagnosis not present

## 2021-07-27 DIAGNOSIS — I2510 Atherosclerotic heart disease of native coronary artery without angina pectoris: Secondary | ICD-10-CM

## 2021-07-27 DIAGNOSIS — J479 Bronchiectasis, uncomplicated: Secondary | ICD-10-CM | POA: Diagnosis not present

## 2021-07-27 DIAGNOSIS — I10 Essential (primary) hypertension: Secondary | ICD-10-CM

## 2021-07-27 DIAGNOSIS — J849 Interstitial pulmonary disease, unspecified: Secondary | ICD-10-CM

## 2021-07-27 MED ORDER — AMLODIPINE BESYLATE 5 MG PO TABS: 5.0000 mg | ORAL_TABLET | Freq: Every day | ORAL | 3 refills | Status: DC

## 2021-07-27 MED ORDER — AMLODIPINE BESYLATE 5 MG PO TABS
5.0000 mg | ORAL_TABLET | Freq: Every day | ORAL | 3 refills | Status: DC
Start: 2021-07-27 — End: 2021-07-27

## 2021-07-27 MED ORDER — METOPROLOL SUCCINATE ER 100 MG PO TB24: ORAL_TABLET | ORAL | 3 refills | Status: DC

## 2021-07-27 MED ORDER — ATORVASTATIN CALCIUM 80 MG PO TABS: 80.0000 mg | ORAL_TABLET | Freq: Every day | ORAL | 3 refills | Status: DC

## 2021-07-27 MED ORDER — LOSARTAN POTASSIUM 100 MG PO TABS: 100.0000 mg | ORAL_TABLET | Freq: Every day | ORAL | 3 refills | Status: DC

## 2021-07-27 NOTE — Progress Notes (Signed)
Cardiology Office Note:    Date:  07/27/2021   ID:  Marcus [name], DOB 1941/07/17, MRN [id number]  PCP:  Hulan Fess, MD  Cardiologist:  Fransico Him, MD    Referring MD: Hulan Fess, MD   Chief Complaint  Patient presents with   Coronary Artery Disease   Hypertension   Hyperlipidemia    History of Present Illness:    Marcus [name] is a 80 y.o. male with a hx of  ASCAD, HTN and dyslipidemia. He has a history of elevated ALT and was evaluated by GI and US showed an enlarged liver.  He stopped ETOH and tylenol and his ALT normalized.  LE arterial dopplers 20017 showed 30-49% left common femoral stenosis.  Stress test 02/2020 showed no ischemia.  When I last saw him he was having problems with progressive shortness of breath, extreme exhaustion and exertional chest pain.  2D echo 01/2021 showed normal LV function with grade 1 diastolic dysfunction and mild AI. He was dx with idiopathic pulmonary fibrosis and is followed by Pulmonary and has chronic DOE.  He is here today for followup and is doing well.  He denies any chest pain or pressure, PND, orthopnea,  dizziness, palpitations or recent syncope. He has chronic DOE related to IPF which is fairly stable. He has chronic LE edema that is stable.  He has hx of PVD with 30-49% left common femoral stenosis.  He has no claudication sx.  He is compliant with his meds and is tolerating meds with no SE.     Past Medical History:  Diagnosis Date   Ascending aorta dilatation ([place])    3m gy echo 01/2020   Benign essential HTN 09/09/2013   Coronary artery disease 08/2013   S/P PCI of the LAD   Dyslipidemia    Elevated fasting glucose    Fatty liver    Glaucoma    Gout    Hyperlipidemia    Hypertension    Joint pain    LAD (lymphadenopathy)    Obesity    Pure hypercholesterolemia 09/09/2013   PVD (peripheral vascular disease) ([place] 06/12/2018     30-49% left common femoral stenosis   Vision abnormalities     Past Surgical  History:  Procedure Laterality Date   ADENOIDECTOMY     CATARACT EXTRACTION     CORONARY ANGIOPLASTY WITH STENT PLACEMENT  2009   EYE SURGERY     TONSILLECTOMY     WISDOM TOOTH EXTRACTION      Current Medications: Current Meds  Medication Sig   allopurinol (ZYLOPRIM) 300 MG tablet Take 300 mg by mouth daily.   aspirin 81 MG tablet Take 81 mg by mouth daily.   atorvastatin (LIPITOR) 80 MG tablet TAKE 1 TABLET DAILY   dorzolamide-timolol (COSOPT) 22.3-6.8 MG/ML ophthalmic solution Place 1 drop into the left eye 2 (two) times daily.   fluticasone (FLONASE) 50 MCG/ACT nasal spray Place into the nose.   latanoprost (XALATAN) 0.005 % ophthalmic solution Place 1 drop into both eyes daily.   losartan (COZAAR) 100 MG tablet TAKE 1 TABLET DAILY   metoprolol succinate (TOPROL-XL) 100 MG 24 hr tablet TAKE 1 TABLET DAILY WITH ORIMMEDIATELY FOLLOWING A    MEAL   minocycline (MINOCIN,DYNACIN) 100 MG capsule Take 100 mg by mouth as needed. For skin acne   Nintedanib (OFEV) 150 MG CAPS Take 1 capsule (150 mg total) by mouth 2 (two) times daily.   nitroGLYCERIN (NITROSTAT) 0.4 MG SL tablet Place 0.4 mg under the tongue every  5 (five) minutes as needed for chest pain.    Omega-3 Fatty Acids (FISH OIL) 1000 MG CAPS Take 2,000 capsules by mouth in the morning and at bedtime. 1 capsules (2060m) by mouth two times a day   pimecrolimus (ELIDEL) 1 % cream APPLY TO AFFECTED AREA ON FACE TWICE A DAY AS NEEDED   tamsulosin (FLOMAX) 0.4 MG CAPS capsule Take 0.4 mg by mouth daily.     Allergies:   Patient has no known allergies.   Social History   Socioeconomic History   Marital status: Married    Spouse name: Not on file   Number of children: Not on file   Years of education: Not on file   Highest education level: Not on file  Occupational History   Not on file  Tobacco Use   Smoking status: Former    Types: Cigars    Quit date: 02/07/2020    Years since quitting: 1.4   Smokeless tobacco: Never    Tobacco comments:    3-4 cigars daily.  off and on for 10 years.  Stopped smoking cigarettes at 23.  smoked from age [age], 2 packs per day.  Stopped smoking cigars back in May 2021.  Substance and Sexual Activity   Alcohol use: Yes    Alcohol/week: 1.0 standard drink    Types: 1 Cans of beer per week    Comment: 2-3 beers daily   Drug use: No   Sexual activity: Not on file  Other Topics Concern   Not on file  Social History Narrative   Not on file   Social Determinants of Health   Financial Resource Strain: Not on file  Food Insecurity: Not on file  Transportation Needs: Not on file  Physical Activity: Not on file  Stress: Not on file  Social Connections: Not on file     Family History: The patient's family history includes Breast cancer in his mother; CAD in his brother and sister; Heart attack in his father; Multiple sclerosis in his sister; Rheumatic fever in his father. There is no history of Neuropathy.  ROS:   Please see the history of present illness.    ROS  All other systems reviewed and negative.   EKGs/Labs/Other Studies Reviewed:    The following studies were reviewed today: none  EKG:  EKG is  ordered today.  The ekg ordered today demonstrates NSR with no ST changes  Recent Labs: 01/19/2021: ALT 38   Recent Lipid Panel    Component Value Date/Time   CHOL 109 06/08/2017 0934   CHOL 105 08/01/2014 0835   TRIG 175 (H) 06/08/2017 0934   TRIG 182 (H) 08/01/2014 0835   HDL 33 (L) 06/08/2017 0934   HDL 26 (L) 08/01/2014 0835   CHOLHDL 3.3 06/08/2017 0934   CHOLHDL 3.7 04/18/2016 0922   VLDL 41 (H) 04/18/2016 0922   LDLCALC 41 06/08/2017 0934   LDLCALC 43 08/01/2014 0835    Physical Exam:    VS:  BP (!) 148/72   Pulse 71   SpO2 98%     Wt Readings from Last 3 Encounters:  05/28/21 299 lb (135.6 kg)  12/08/20 (!) 300 lb 3.2 oz (136.2 kg)  10/28/20 (!) 305 lb 9.6 oz (138.6 kg)    GEN: Well nourished, well developed in no acute distress HEENT:  Normal NECK: No JVD; No carotid bruits LYMPHATICS: No lymphadenopathy CARDIAC:RRR, no murmurs, rubs, gallops RESPIRATORY:  Clear to auscultation without rales, wheezing or rhonchi  ABDOMEN: Soft, non-tender,  non-distended MUSCULOSKELETAL:  No edema; No deformity  SKIN: Warm and dry NEUROLOGIC:  Alert and oriented x 3 PSYCHIATRIC:  Normal affect   ASSESSMENT:    1. Atherosclerosis of native coronary artery of native heart, unspecified whether angina present   2. Benign essential HTN   3. Pure hypercholesterolemia   4. PVD (peripheral vascular disease) ([place])    PLAN:    In order of problems listed above:  1.  ASCAD/DOE -s/p remote PCI of LAD -Lexiscan Myoview 2021 showed no ischemia -2D echo 02/2021 showed normal LV function with grade 1 diastolic dysfunction -continue ASA 64m daily, BB and statin  2.  HTN -BP is borderline elevated on exam today -Continue prescription drug management with losartan 100 mg daily and Toprol-XL 100 mg daily with as needed refills -add amlodipine 511mdaily -check BP daily for a week and call with results -Check bmet today  3.  HLD -LDL goal < 70 -FLP and ALT -Continue prescription drug management with atorvastatin 80 mg daily with as needed refills   4.  PVD -LE arterial dopplers 20017 showed 30-49% left common femoral stenosis.   -denies any claudication problems -encouraged walking program -continue statin  Medication Adjustments/Labs and Tests Ordered: Current medicines are reviewed at length with the patient today.  Concerns regarding medicines are outlined above.  Orders Placed This Encounter  Procedures   EKG 12-Lead    No orders of the defined types were placed in this encounter.   Signed, TrFransico HimMD  07/27/2021 1:20 PM    [facility]

## 2021-07-27 NOTE — Addendum Note (Signed)
Addended by: Antonieta Iba on: 07/27/2021 01:35 PM   Modules accepted: Orders

## 2021-07-27 NOTE — Patient Instructions (Addendum)
Medication Instructions:  Your physician has recommended you make the following change in your medication: 1) START taking amlodipine 5 mg daily  *If you need a refill on your cardiac medications before your next appointment, please call your pharmacy*   Lab Work: CMET and FLP If you have labs (blood work) drawn today and your tests are completely normal, you will receive your results only by: [place] (if you have MyChart) OR A paper copy in the mail If you have any lab test that is abnormal or we need to change your treatment, we will call you to review the results.  Follow-Up: At [facility], you and your health needs are our priority.  As part of our continuing mission to provide you with exceptional heart care, we have created designated Provider Care Teams.  These Care Teams include your primary Cardiologist (physician) and Advanced Practice Providers (APPs -  Physician Assistants and Nurse Practitioners) who all work together to provide you with the care you need, when you need it.  Your next appointment:   1 year with Dr. Hendricks Milo

## 2021-07-27 NOTE — Addendum Note (Signed)
Addended by: Antonieta Iba on: 07/27/2021 01:44 PM   Modules accepted: Orders

## 2021-08-12 ENCOUNTER — Other Ambulatory Visit: Payer: Medicare HMO

## 2021-08-17 ENCOUNTER — Other Ambulatory Visit: Payer: Medicare HMO | Admitting: *Deleted

## 2021-08-17 ENCOUNTER — Other Ambulatory Visit: Payer: Self-pay

## 2021-08-17 DIAGNOSIS — I739 Peripheral vascular disease, unspecified: Secondary | ICD-10-CM | POA: Diagnosis not present

## 2021-08-17 DIAGNOSIS — I2510 Atherosclerotic heart disease of native coronary artery without angina pectoris: Secondary | ICD-10-CM | POA: Diagnosis not present

## 2021-08-17 DIAGNOSIS — E7800 Pure hypercholesterolemia, unspecified: Secondary | ICD-10-CM

## 2021-08-17 DIAGNOSIS — I10 Essential (primary) hypertension: Secondary | ICD-10-CM

## 2021-08-17 LAB — LIPID PANEL
Chol/HDL Ratio: 3.5 ratio (ref 0.0–5.0)
Cholesterol, Total: 115 mg/dL (ref 100–199)
HDL: 33 mg/dL — ABNORMAL LOW (ref >39)
LDL Chol Calc (NIH): 51 mg/dL (ref 0–99)
Triglycerides: 186 mg/dL — ABNORMAL HIGH (ref 0–149)
VLDL Cholesterol Cal: 31 mg/dL (ref 5–40)

## 2021-08-17 LAB — COMPREHENSIVE METABOLIC PANEL
ALT: 52 IU/L — ABNORMAL HIGH (ref 0–44)
AST: 35 IU/L (ref 0–40)
Albumin/Globulin Ratio: 2 (ref 1.2–2.2)
Albumin: 4.3 g/dL (ref 3.7–4.7)
Alkaline Phosphatase: 60 IU/L (ref 44–121)
BUN/Creatinine Ratio: 21 (ref 10–24)
BUN: 30 mg/dL — ABNORMAL HIGH (ref 8–27)
Bilirubin Total: 0.5 mg/dL (ref 0.0–1.2)
CO2: 21 mmol/L (ref 20–29)
Calcium: 9.2 mg/dL (ref 8.6–10.2)
Chloride: 108 mmol/L — ABNORMAL HIGH (ref 96–106)
Creatinine, Ser: 1.45 mg/dL — ABNORMAL HIGH (ref 0.76–1.27)
Globulin, Total: 2.2 g/dL (ref 1.5–4.5)
Glucose: 126 mg/dL — ABNORMAL HIGH (ref 70–99)
Potassium: 5.1 mmol/L (ref 3.5–5.2)
Sodium: 143 mmol/L (ref 134–144)
Total Protein: 6.5 g/dL (ref 6.0–8.5)
eGFR: 49 mL/min/{1.73_m2} — ABNORMAL LOW (ref >59)

## 2021-08-18 ENCOUNTER — Encounter: Payer: Self-pay | Admitting: Internal Medicine

## 2021-08-18 ENCOUNTER — Ambulatory Visit: Payer: Medicare HMO | Admitting: Internal Medicine

## 2021-08-18 VITALS — BP 142/74 | HR 66 | Ht 75.0 in | Wt 300.0 lb

## 2021-08-18 DIAGNOSIS — Z5181 Encounter for therapeutic drug level monitoring: Secondary | ICD-10-CM

## 2021-08-18 DIAGNOSIS — R7401 Elevation of levels of liver transaminase levels: Secondary | ICD-10-CM

## 2021-08-18 DIAGNOSIS — J84112 Idiopathic pulmonary fibrosis: Secondary | ICD-10-CM

## 2021-08-18 DIAGNOSIS — I10 Essential (primary) hypertension: Secondary | ICD-10-CM

## 2021-08-18 NOTE — Progress Notes (Signed)
IOV 06/09/2020  Subjective:  Patient ID: Marcus [name], male , DOB: 10-28-1940 , age 80 y.o. , MRN: [id number] , ADDRESS: [address] [place] [address]  PCP Hulan Fess, MD Cardiologist Dr. Golden Hurter   06/09/2020 -   Chief Complaint  Patient presents with   Consult    sob for the last year, worse for the last 6 month.     HPI Marcus [name] 80 y.o. -80 year old male with a history of atherosclerotic coronary artery disease [status post remote PCI of LAD], hypertension and hyperlipidemia.  History of transaminitis that resolved after stopping ethanol and Tylenol.  Lower extremity Dopplers in 2017 30-49% left common femoral arterial stenosis.  He saw Dr. Radford Pax cardiology in May 2021 with new onset of shortness of breath that is progressive.  Associated with exertional chest pressure.  At baseline he goes to [place] a lot and walks 3 miles [place] time [place] the beach but as of May 2021 he was only able to walk 5 feet and had to stop halfway through that walk.  Also notices walking up stairs relieved by rest.  Overall dyspnea on exertion for 1 year.  Worse in the last 6 months but he also says that he some better in the last few months.  Denies any proximal nocturnal dyspnea orthopnea or lower extremity edema dizziness or palpitations or syncope.  Because of this he underwent nuclear medicine cardiac stress test February 14, 2020 that was low risk with ejection fraction 65%.  But he has had persistent symptoms and therefore has been referred here.  He then had pulmonary function test April 02, 2020 that shows mild restriction with FVC 3.82 L / 75% total lung capacity 5.76/71% but normal DLCO.  But because of persistent symptoms has been referred here.  Of note his echo showed grade 1 diastolic dysfunction.  Of note he also has a chronic cough during the same time.  -He tells me that recently his [facility] duct died out and after that the cough improved when the [facility] duct was off.  He is now  going to get his [facility] duct evaluated.    He also reports 10 pound weight gain during this time.  Normally is around 290 pounds.  Now he is at 300 pounds.  He is also been more sedentary because of the pandemic.  He says during this time his wife has noticed that he occasionally snores.  He does fall easily fatigued during the daytime.  He also has disrupted sleep pattern.  I did a stop bang questionnaire and it is 6 out of 8 suggesting significantly elevated risk for sleep apnea.  He has never had a sleep study.`  He is going back to [place], [place] where he owns another home.  He will not be able to do test in the next few weeks.   Results for Marcus [name], Marcus [name] (MRN [id number]) as of 06/09/2020 14:16  Ref. Range 03/23/2017 09:13  Please Note (HCV): Unknown Comment  Anti Nuclear Antibody (ANA) Latest Ref Range: Negative  Negative  ANCA Proteinase 3 Latest Ref Range: 0.0 - 3.5 U/mL <3.5  Atypical pANCA Latest Ref Range: Neg:<1:20 titer <1:20  ENA SSA (RO) Ab Latest Ref Range: 0.0 - 0.9 AI <0.2  ENA SSB (LA) Ab Latest Ref Range: 0.0 - 0.9 AI <0.2  Deamidated Gliadin Abs, IgG Latest Ref Range: 0 - 19 units 2  Myeloperoxidase Ab Latest Ref Range: 0.0 - 9.0 U/mL <9.0  RA Latex Turbid.  Latest Ref Range: 0.0 - 13.9 IU/mL <10.0  Transglutaminase IgA Latest Ref Range: 0 - 3 U/mL <2  Cytoplasmic (C-ANCA) Latest Ref Range: Neg:<1:20 titer <1:20  P-ANCA Latest Ref Range: Neg:<1:20 titer <1:20  Antigliadin Abs, IgA Latest Ref Range: 0 - 19 units 6  IgG (Immunoglobin G), Serum Latest Ref Range: 700 - 1,600 mg/dL 825  IgM (Immunoglobulin M), Srm Latest Ref Range: 15 - 143 mg/dL 126  IgA/Immunoglobulin A, Serum Latest Ref Range: 61 - 437 mg/dL 312  Lyme IgG/IgM Ab Latest Ref Range: 0.00 - 0.90 ISR <0.91    NP visit Nov 2021   07/23/2020  - Visit   80 year old male former smoker followed in our office for dyspnea on exertion and chronic cough.  He is established with Dr. Chase Caller.  He was seen for an  initial consult in September/2021.  Plan of care from that consult with Dr. Chase Caller was as follows: Obtain high-resolution CT chest, do simple walk test, CBC with differential, IgE.  Stop bang score 6.  Was referred to Dr. Radford Pax for sleep study.  Patient presenting today as follow-up.  Patient reporting that he has been scheduled for home sleep study with cardiology on 08/11/2020.  Insurance would not pay for an in lab study.  Patient denies any sort of family history of lung disease.  He does report potential exposures when he was in the service for 2 years 1 of those years being in [place].  He reports a previous history of a primary care provider reporting that when he returned he may have had malaria and he was on malaria medications.  He is unsure of the validity of this.  Patient also worked in copper rod production for around 3 years.  He was around acid tanks for around 1 to 2 years during the rod washing.  He did not wear a mask.  He reports there is lots of smoke, copper dust as well as he to wear special close due to the exposures of the acid.  Patient is also currently dealing with an HVAC issue.  There is concerned there may be mold in this.  Both him and his wife have had respiratory symptoms of increased cough when the machine was running.  Is currently turned off and is being prepared to be cleaned.  He reports that the initial inspection by the HVAC team felt that there was no mold present.  Patient denies any persistent issues of acid reflux.  He does have a history of GERD and has a history of hiatal hernia.  He was treated with Nexium for around 6 to 8 weeks.  He reports he has been off this medication for many years.  He does not have breakthrough reflux symptoms or require Tums.  Patient continues to have aspects of shortness of breath occasionally.  Especially with physical exertion.  Patient also has an occasional cough.  Cough is more present in the morning when he is waking up.   Sometimes cough is productive.  He does have significant clear nasal drainage.  He is occasionally using a nasal medication that he believes is Flonase that his wife purchased over-the-counter.   OV 08/25/2020   Subjective:  Patient ID: Marcus [name], male , DOB: 05-Jul-1941, age 63 y.o. years. , MRN: [id number],  ADDRESS: [address] [place] [address] PCP  Hulan Fess, MD Providers : Treatment Team:  Attending Provider: Brand Males, MD Patient Care Team: Hulan Fess, MD as PCP - General (Family Medicine)  Sueanne Margarita, MD as PCP - Cardiology (Cardiology)    Chief Complaint  Patient presents with   Follow-up    ILD, doing ok, occ cough with some mucous       HPI Marcus [name] 80 y.o. -returns for follow-up to discuss his test results.  He is here with his wife who I am meeting for the first time.  His high-resolution CT chest is interpreted as indeterminate for UIP.  I personally visualized this I personally thought it was probable UIP.  I took a second opinion from Dr. Carilyn Goodpasture thoracic radiologist who feels that patient could get classified as probable UIP on the CT scan.  Long discussion was held with the patient but he did not bring his ILD questionnaire.  He had to bring this after the visit.  Therefore parts of this dictation happened after his visit but on the same day.  His main concern is that he will die prematurely.  [facility] Comprehensive ILD Questionnaire  Symptoms: He tells me that he had insidious onset of shortness of breath for the last 12 months.  Since it started it is the same.  He has level 1 dyspnea for household work, shopping and walking at a level pace.  Level 2 dyspnea on walking up stairs.  He does have difficulty keeping up with others of his age.  He has neuropathy and hip soreness.  He also has a cough since March 2021.  It is the same since it started moderate in severity.  It is dry.  He does clear his throat and he  does feel a tickle in his throat.  There is no hemoptysis or sputum production.  No wheezing no nausea no vomiting no diarrhea.  The cough is mostly in the morning.  His symptoms do feel better when he stops activity for 5 minutes.  It makes him feel more short of breath when he does vigorous activity.  No associated symptoms.   Past Medical History : He has a history of coronary artery disease with 1 stent. He is undergoing a sleep apnea work-up.  He has chronic kidney disease stage III according to his history. Malaria in [place]. Polio as a kid   He does not have rheumatoid arthritis.  No scleroderma no lupus no polymyositis no Sjogren's.  No other vasculitis.  No thyroid disease no diabetes.  No stroke no seizures.  No hepatitis no pneumonia.  His creatinine in May 2021 was 1.58 mg percent with a GFR of 42-48.  In October 2019 his creatinine was 1.35 mg percent.   Of note he says his dental hygienist has noted persistent redness in his hard palate.  He is asking me to refer him to ENT specialist and I have obliged  ROS: He does have arthralgia related to gout.  He has seborrhea.  He denies any oral ulcers or heartburn or vomiting or nausea.  Denies any weight loss.  He has obesity.  No recurrent fever no dryness.  No fatigue   FAMILY HISTORY of LUNG DISEASE: No pulmonary fibrosis no COPD.  His sister has autoimmune disease Brother has asthma   EXPOSURE HISTORY: He smokes cigarettes between 1960 and 1966 2 packs.  He also smoked cigars for 20 years.  No passive smoking.  No electronic cigarettes no marijuana no vaping no cocaine no intravenous drug use. Was      HOME and HOBBY DETAILS : Single-family home [place] for the last 10 years.  Age of the home is 25 years.  It is a 5 bedroom house.  There is no dampness.  No mold or mildew no humidifier use no CPAP use no nebulizer use.  No steam iron use no Jacuzzi use no misting Fountain.  No pet birds or parakeets no pet  gerbils or hamsters.  No feather pillows no mold in the [facility] duct.  No music habits.  No gardening habits.  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  During this time he was exposed to gas fumes and chemicals he reports that sulfuric acid copper out washing.  Otherwise negative for organic and inorganic antigens.   PULMONARY TOXICITY HISTORY (27 items): Negative     Radiology below were read by Dr. Lorin Picket is indeterminate for UIP.  My personal opinion this is probable UIP.  Dr. Carilyn Goodpasture radiologist can also concur with me it is probable UIP   Narrative & Impression  CLINICAL DATA:  Shortness of breath. Productive cough and shortness of breath exertion for approximately 1 year.   EXAM: CT CHEST WITHOUT CONTRAST   TECHNIQUE: Multidetector CT imaging of the chest was performed following the standard protocol without intravenous contrast. High resolution imaging of the lungs, as well as inspiratory and expiratory imaging, was performed.   COMPARISON:  None.   FINDINGS: Cardiovascular: Atherosclerotic calcification of the aorta, aortic valve and coronary arteries. Pulmonic trunk is enlarged. Heart is at the upper limits of normal in size to mildly enlarged. No pericardial effusion.   Mediastinum/Nodes: No pathologically enlarged mediastinal or axillary lymph nodes. Hilar regions are difficult to evaluate without IV contrast. Esophagus is unremarkable.   Lungs/Pleura: Mild basilar predominant subpleural reticulation, ground-glass and traction bronchiectasis/bronchiolectasis. Findings persist on prone imaging. No definitive honeycombing. No air trapping. 3 mm left upper lobe nodule (3/57). 4 mm peripheral left lower lobe nodule (3/106). Mild centrilobular emphysema. No pleural fluid. Airway is unremarkable.   Upper Abdomen: Visualized portions of the liver,  gallbladder, adrenal glands, kidneys, spleen, pancreas, stomach and bowel are grossly unremarkable. No upper abdominal adenopathy.   Musculoskeletal: Degenerative changes in the spine.   IMPRESSION: 1. Mild basilar predominant subpleural fibrosis may be due to nonspecific interstitial pneumonitis or usual interstitial pneumonitis. Findings are indeterminate for UIP per consensus guidelines: Diagnosis of Idiopathic Pulmonary Fibrosis: An Official ATS/ERS/JRS/ALAT Clinical Practice Guideline. [place], Iss 5, [phone], May 13 2017. 2. Pulmonary nodules measure up to 4 mm. No follow-up needed if patient is low-risk (and has no known or suspected primary neoplasm). Non-contrast chest CT can be considered in 12 months if patient is high-risk. This recommendation follows the consensus statement: Guidelines for Management of Incidental Pulmonary Nodules Detected on CT Images: From the Fleischner Society 2017; Radiology 2017; 284:228-243. 3. Aortic atherosclerosis (ICD10-I70.0). Coronary artery calcification. 4. Enlarged pulmonic trunk, indicative of pulmonary arterial hypertension. 5.  Emphysema (ICD10-J43.9).     Electronically Signed   By: Lorin Picket M.D.   On: 07/15/2020 14:33    Results for Marcus [name], Marcus [name] (MRN [id number]) as of 08/25/2020 10:28  Ref. Range 07/23/2020 09:56 07/23/2020 09:58  Anti Nuclear Antibody (ANA) Latest Ref Range: NEGATIVE  POSITIVE (A)   ANA Pattern 1 Unknown Nuclear, Homogeneous (A)   ANA Titer 1 Latest Units: titer 5:78 (H)   Cyclic Citrullin Peptide Ab Latest Units: UNITS <16  RA Latex Turbid. Latest Ref Range: <14 IU/mL <14   ANA,IFA RA DIAG PNL W/RFLX TIT/PATN Unknown Rpt (A)   Anti-Jo-1 Ab (RDL) Latest Ref Range: <20 Units  <20  Anti-PL-7 Ab (RDL) Latest Ref Range: Negative   Negative  Anti-PL-12 Ab (RDL) Latest Ref Range: Negative   Negative  Anti-EJ Ab (RDL) Latest Ref Range: Negative   Negative  Anti-OJ Ab (RDL)  Latest Ref Range: Negative   Negative  Anti-SRP Ab (RDL) Latest Ref Range: Negative   Negative  Anti-Mi-2 Ab (RDL) Latest Ref Range: Negative   Negative  Anti-TIF-1gamma Ab (RDL) Latest Ref Range: <20 Units  <20  Anti-MDA-5 Ab (CADM-140)(RDL) Latest Ref Range: <20 Units  <20  Anti-NXP-2 (P140) Ab (RDL) Latest Ref Range: <20 Units  <20  Anti-SAE1 Ab, IgG (RDL) Latest Ref Range: <20 Units  <20  Anti-PM/Scl-100 Ab (RDL) Latest Ref Range: <20 Units  <20  Anti-Ku Ab (RDL) Latest Ref Range: Negative   Negative  Anti-SS-A 52kD Ab, IgG (RDL) Latest Ref Range: <20 Units  <20  Anti-U1 RNP Ab (RDL) Latest Ref Range: <20 Units  <20  Anti-U2 RNP Ab (RDL) Latest Ref Range: Negative   Negative  Anti-U3 RNP (Fibrillarin)(RDL) Latest Ref Range: Negative   Negative  SSA (Ro) (ENA) Antibody, IgG Latest Ref Range: <1.0 NEG AI <1.0 NEG   SSB (La) (ENA) Antibody, IgG Latest Ref Range: <1.0 NEG AI <1.0 NEG   Scleroderma (Scl-70) (ENA) Antibody, IgG Latest Ref Range: <1.0 NEG AI <1.0 NEG     OV 10/28/2020  Subjective:  Patient ID: Marcus [name], male , DOB: Feb 09, 1941 , age 36 y.o. , MRN: [id number] , ADDRESS: [address] [place] [address] PCP Hulan Fess, MD Patient Care Team: Hulan Fess, MD as PCP - General (Family Medicine) Sueanne Margarita, MD as PCP - Cardiology (Cardiology)  This Provider for this visit: Treatment Team:  Attending Provider: Brand Males, MD    10/28/2020 -   Chief Complaint  Patient presents with   Follow-up    Get pft results.  Gets bloody nose when he blows his nose, small amount of blood, just since starting Esbriet.    IPF dx 08/25/2020: High PRob  clinical suspicion is you have IPF based on age greater than 46, male gender, Caucasian ethnicity, negative serology profile, history of acid reflux, previous exposure to smoke and copper and prior history of smoking and prob UIP on CT.  Start esbriet Januaryc 08, 2022 HPI Marcus [name] 80 y.o. -follow-up  idiopathic pulmonary fibrosis new diagnosis December 2021. We started him on pirfenidone January 2022. He says he is tolerating it really well. His symptom score is stable. In fact he feels that pirfenidone is helping him. He feels his lungs have opened up. He feels that the antifibrotic actions have kicked in. He does admit this could be psychological. He did complain of some bloody nose when he blows up. He wondered if it is because of pirfenidone. Explained to him pirfenidone does not have any antiplatelet agents. Explained that likely this is due to the cold winter and dry air. He shuttles between [place] and [place]. Did explain to him that if he were to relocate to [place] I would set him up with excellent ILD specialist at [facility]. We discussed patient support group. He seems interested. We discussed pulmonary rehabilitation but he has back and spine issues and knee issues that make it difficult. He wants to reflect on this. We discussed clinical trials as a care  option including specifically all the details below. He wants to reflect on this but he feels that shuttling between [place] in [place] can be difficult to do clinical trials. However once is stable on the drug and sometimes past he might be able to do it.  There are no other new issues. Last visit I did indicate to him to consider stopping his omega-3 because of concern of omega-3 making acid reflux worse which is a risk factor for IPF. However he says it has helped his triglycerides. He wants to continue on this. His acid reflux is not super active. I have supported him in this decision.     PFT    OV 12/08/2020  Subjective:  Patient ID: Marcus [name], male , DOB: 13-Jan-1941 , age 29 y.o. , MRN: [id number] , ADDRESS: [address] [place] [address] PCP Hulan Fess, MD Patient Care Team: Hulan Fess, MD as PCP - General (Family Medicine) Sueanne Margarita, MD as PCP - Cardiology (Cardiology)  This  Provider for this visit: Treatment Team:  Attending Provider: Brand Males, MD   IPF dx 08/25/2020 (reconfirmed MDD 11/17/20) : High PRob  clinical suspicion is you have IPF based on age greater than 42, male gender, Caucasian ethnicity, negative serology profile, history of acid reflux, previous exposure to smoke and copper and prior history of smoking and prob UIP on CT.   Start esbriet Marcus [name] 08, 2022   12/08/2020 -   Chief Complaint  Patient presents with   Follow-up    Pt states he is doing okay since last visit and states breathing is about the same.     HPI Marcus [name] 80 y.o. -+ presents for follow-up.  Last visit was 1 month ago.  This visit was supposed to be a televisit but he showed up at the front door.  Therefore he preferred to do a face-to-face visit.  We discussed in the clinical conference and confirmation is that he is IPF.  He says he is tolerating pirfenidone well.  He is going to start his third month of pirfenidone in the first week of April 2022.  He says occasionally bites his tongue at night.  This is associated with starting losartan 6 months ago but is not on ACE inhibitor.  He will discuss this with the cardiologist.  Also says that tongue biting started after the pirfenidone but it is not a known side effect of pirfenidone.  Overall he is feeling stable.  He is not interested in clinical trials of pulmonary rehabilitation at this point.  He shuttling between [place] in [place].  His symptom scores are listed below.    Telel visit 01/19/21  80 yo former smoker followed for IPF Medical history significant for coronary artery disease, hypertension and hyperlipidemia.  Today's telemedicine visit is for a 6-week follow-up.  Patient has presumed IPF with interstitial changes consistent with UIP on high-resolution CT chest.  Patient was started on Esbriet in January 2022.  Patient says he is tolerating well.  He has had no nausea vomiting or diarrhea.   Appetite is good no weight loss.  Patient says he still gets short of breath with activities but tries to remain active.  He has minimum cough. Patient aware that he needs to return for lab work today.  Patient denies any flare of cough or shortness of breath.  No change in his activity level.    OV 05/28/2021  Subjective:  Patient ID: Marcus [name], male , DOB: 1941-05-24 , age  46 y.o. , MRN: [id number] , ADDRESS: [address] [place] [address] PCP Hulan Fess, MD Patient Care Team: Hulan Fess, MD as PCP - General (Family Medicine) Sueanne Margarita, MD as PCP - Cardiology (Cardiology)  This Provider for this visit: Treatment Team:  Attending Provider: Brand Males, MD    05/28/2021 -   Chief Complaint  Patient presents with   Follow-up    Pt states he has been doing okay since last visit. States that he stopped taking Esbriet about a month ago. Pt states that his breathing is about the same.    IPF dx 08/25/2020 (reconfirmed MDD 11/17/20) : High PRob  clinical suspicion is you have IPF based on age greater than 37, male gender, Caucasian ethnicity, negative serology profile, history of acid reflux, previous exposure to smoke and copper and prior history of smoking and prob UIP on CT.   Start esbriet Januaryc 08, 2022 - stopped summer 2022 afte sun rash x 2 times   Starting ofev  HPI Marcus [name] 80 y.o. -       OV 08/18/2021  Subjective:  Patient ID: Marcus [name], male , DOB: 27-Jan-1941 , age 46 y.o. , MRN: [id number] , ADDRESS: [address] [place] [address] PCP Hulan Fess, MD Patient Care Team: Hulan Fess, MD as PCP - General (Family Medicine) Sueanne Margarita, MD as PCP - Cardiology (Cardiology)  This Provider for this visit: Treatment Team:  Attending Provider: Brand Males, MD    08/18/2021 -   Chief Complaint  Patient presents with   Follow-up    F/U after starting Ofev. States his BP has increased since  starting Ofev on November 1st. Has had occasional episodes of diarrhea.     IPF dx 08/25/2020 (reconfirmed MDD 11/17/20) : High PRob  clinical suspicion is you have IPF based on age greater than 44, male gender, Caucasian ethnicity, negative serology profile, history of acid reflux, previous exposure to smoke and copper and prior history of smoking and prob UIP on CT.   Start esbriet Januaryc 08, 2022 - stopped summer 2022 afte sun rash x 2 times   Starting ofev  HPI Marcus [name] 80 y.o. -returns for follow-up.  In the interim because he failed pirfenidone we switched him to nintedanib.  He is tolerating it well except for some mild diarrhea but 2 weeks after starting this his blood pressure was high the cardiology visit.  I reviewed the notes.  He started nintedanib around late October 2022/early November 2022 and by mid November there was increase in blood pressure [the third patient I am seeing with this issue this year].  Cardiologist Dr. Radford Pax has added amlodipine 5 mg/day to the regimen with is not controlling the blood pressure.  I did advise him that he could contact and increase it to 10 mg/day with a side effect being edema.  He is going to look into this.  His respiratory status is stable.  He had liver function test monitoring and he has a new increase in ALT.  He is on allopurinol and Lipitor which can also increase his liver enzymes but he has been on this for a long time.  Only new intervention is nintedanib.  Very likely and nintedanib is increasing the liver enzymes but is only a mild enzyme increase in 1 enzyme which is ALT.  He is agreed for close monitoring.  I did not ask him about his alcohol intake.  He has not had PFTs.  We  are holding off on PFTs because of his recent eye surgery which has gone well but the recovery is slow but improving.   He had high-resolution CT scan of the chest recently in November 2022.  Compared to 1 year ago there is no progression.  There are no  other new findings.  And CT scan of the chest in 1 year.  Recent Labs  Lab 08/17/21 0916  AST 35  ALT 52*  ALKPHOS 60  BILITOT 0.5  PROT 6.5  ALBUMIN 4.3        SYMPTOM SCALE -  06/09/2020  08/25/2020  10/28/2020  12/08/2020  08/18/2021   O2 use _0   Shortness of Breath 0 -> 5 scale with 5 being worst (score 6 If unable to do)    0  At rest 0 0 0 0 1.5  Simple tasks - showers, clothes change, eating, shaving 1 0 _1 Household (dishes, doing bed, laundry) _2 Shopping _3 1.  Walking level at own pace _4 1.5  Walking up Stairs _5 Total (30-36) Dyspnea Score _6 How bad is your cough? _7 How bad is your fatigue _8 How bad is nausea 0 0 0 0 0  How bad is vomiting?  00 0 0 0 0  How bad is diarrhea? 0 0 0 0 2  How bad is anxiety? 0 0 0 0 0  How bad is depression 0 0 0 0 0       Simple office walk 185 feet x  3 laps goal with forehead probe 06/09/2020  08/25/2020  10/28/2020  05/28/2021   O2 used ra  ra ra  Number laps completed _9 Comments about pace nl 100% and 64/min moderate   Resting Pulse Ox/HR 100% and 61/min  100% and 63 100% and 66  Final Pulse Ox/HR 97% and 98/min 98% and 80/min 99% and 100 98% and 1112  Desaturated </= 88% no     Desaturated <= 3% points yes     Got Tachycardic >/= 90/min yes     Symptoms at end of test x  Mod desypnea   Miscellaneous comments x  Dyspnea started lap 1.NEver stopped     CT Chest data 07/27/21  CLINICAL DATA:  80 year old male with history of interstitial lung disease. Follow-up study.   EXAM: CT CHEST WITHOUT CONTRAST   TECHNIQUE: Multidetector CT imaging of the chest was performed following the standard protocol without intravenous contrast. High resolution imaging of the lungs, as well as inspiratory and expiratory imaging, was performed.   COMPARISON:  Chest CT 07/15/2020.   FINDINGS: Cardiovascular: Heart size is normal. There is no  significant pericardial fluid, thickening or pericardial calcification. There is aortic atherosclerosis, as well as atherosclerosis of the great vessels of the mediastinum and the coronary arteries, including calcified atherosclerotic plaque in the left main, left anterior descending, left circumflex and right coronary arteries.   Mediastinum/Nodes: No pathologically enlarged mediastinal or hilar lymph nodes. Please note that accurate exclusion of hilar adenopathy is limited on noncontrast CT scans. Esophagus is unremarkable in appearance. No axillary lymphadenopathy.   Lungs/Pleura: High-resolution images demonstrate areas of ground-glass attenuation, septal thickening, mild subpleural reticulation, mild cylindrical bronchiectasis, thickening of the peribronchovascular interstitium and regional architectural distortion and volume loss. These  findings are most evident in the lung bases, particularly in the lower lobes. Bronchiectasis is now more apparent in these regions, but no other significant progression of disease when compared to the prior study. No honeycombing. Inspiratory and expiratory imaging is unremarkable.   Upper Abdomen: Aortic atherosclerosis.   Musculoskeletal: There are no aggressive appearing lytic or blastic lesions noted in the visualized portions of the skeleton.   IMPRESSION: 1. The appearance of the lungs remains compatible with interstitial lung disease, with a spectrum of findings categorized as probable usual interstitial pneumonia (UIP) per current ATS guidelines. No significant progression of disease compared to the prior study. 2. Aortic atherosclerosis, in addition to left main and 3 vessel coronary artery disease. Please note that although the presence of coronary artery calcium documents the presence of coronary artery disease, the severity of this disease and any potential stenosis cannot be assessed on this non-gated CT examination. Assessment  for potential risk factor modification, dietary therapy or pharmacologic therapy may be warranted, if clinically indicated.   Aortic Atherosclerosis (ICD10-I70.0).     Electronically Signed   By: Vinnie Langton M.D.   On: 07/28/2021 10:58    No results found.    PFT  PFT Results Latest Ref Rng & Units 10/28/2020 04/02/2020  FVC-Pre L 3.69 3.82  FVC-Predicted Pre % 74 75  FVC-Post L 3.68 3.85  FVC-Predicted Post % 73 76  Pre FEV1/FVC % % 80 80  Post FEV1/FCV % % 83 83  FEV1-Pre L 2.94 3.04  FEV1-Predicted Pre % 81 83  FEV1-Post L 3.06 3.20  DLCO uncorrected ml/min/mmHg 21.84 23.34  DLCO UNC% % 76 81  DLCO corrected ml/min/mmHg 21.84 23.34  DLCO COR %Predicted % 76 81  DLVA Predicted % 99 103  TLC L 5.72 5.76  TLC % Predicted % 70 71  RV % Predicted % 72 72       has a past medical history of Ascending aorta dilatation (HCC), Benign essential HTN (09/09/2013), Coronary artery disease (08/2013), Dyslipidemia, Elevated fasting glucose, Fatty liver, Glaucoma, Gout, Hyperlipidemia, Hypertension, Joint pain, LAD (lymphadenopathy), Obesity, Pure hypercholesterolemia (09/09/2013), PVD (peripheral vascular disease) ([place]) (06/12/2018), and Vision abnormalities.   reports that he quit smoking about 18 months ago. His smoking use included cigars. He has never used smokeless tobacco.  Past Surgical History:  Procedure Laterality Date   ADENOIDECTOMY     CATARACT EXTRACTION     CORONARY ANGIOPLASTY WITH STENT PLACEMENT  2009   EYE SURGERY     TONSILLECTOMY     WISDOM TOOTH EXTRACTION      No Known Allergies  Immunization History  Administered Date(s) Administered   Fluad Quad(high Dose 65+) 05/13/2020   Influenza, High Dose Seasonal PF 06/06/2019   Influenza-Unspecified 06/08/2020   PFIZER(Purple Top)SARS-COV-2 Vaccination 09/28/2019, 10/19/2019, 06/08/2020, 02/05/2021   Pneumococcal Conjugate-13 10/23/2014   Pneumococcal Polysaccharide-23 08/27/2012   Tdap 08/27/2012    Zoster, Live 08/29/2012, 09/28/2019, 03/11/2020    Family History  Problem Relation Age of Onset   Breast cancer Mother    Heart attack Father    Rheumatic fever Father    CAD Sister    Multiple sclerosis Sister    CAD Brother    Neuropathy Neg Hx      Current Outpatient Medications:    allopurinol (ZYLOPRIM) 300 MG tablet, Take 300 mg by mouth daily., Disp: , Rfl:    amLODipine (NORVASC) 5 MG tablet, Take 1 tablet (5 mg total) by mouth daily., Disp: 90 tablet, Rfl: 3  aspirin 81 MG tablet, Take 81 mg by mouth daily., Disp: , Rfl:    atorvastatin (LIPITOR) 80 MG tablet, Take 1 tablet (80 mg total) by mouth daily., Disp: 90 tablet, Rfl: 3   dorzolamide-timolol (COSOPT) 22.3-6.8 MG/ML ophthalmic solution, Place 1 drop into the left eye 2 (two) times daily., Disp: , Rfl:    fluticasone (FLONASE) 50 MCG/ACT nasal spray, Place into the nose., Disp: , Rfl:    latanoprost (XALATAN) 0.005 % ophthalmic solution, Place 1 drop into both eyes daily., Disp: , Rfl:    losartan (COZAAR) 100 MG tablet, Take 1 tablet (100 mg total) by mouth daily., Disp: 90 tablet, Rfl: 3   metoprolol succinate (TOPROL-XL) 100 MG 24 hr tablet, Take with or immediately following a meal., Disp: 90 tablet, Rfl: 3   minocycline (MINOCIN,DYNACIN) 100 MG capsule, Take 100 mg by mouth as needed. For skin acne, Disp: , Rfl: 3   Nintedanib (OFEV) 150 MG CAPS, Take 1 capsule (150 mg total) by mouth 2 (two) times daily., Disp: 180 capsule, Rfl: 2   nitroGLYCERIN (NITROSTAT) 0.4 MG SL tablet, Place 0.4 mg under the tongue every 5 (five) minutes as needed for chest pain. , Disp: , Rfl:    Omega-3 Fatty Acids (FISH OIL) 1000 MG CAPS, Take 2,000 capsules by mouth in the morning and at bedtime. 1 capsules (2077m) by mouth two times a day, Disp: , Rfl:    pimecrolimus (ELIDEL) 1 % cream, APPLY TO AFFECTED AREA ON FACE TWICE A DAY AS NEEDED, Disp: , Rfl:    tamsulosin (FLOMAX) 0.4 MG CAPS capsule, Take 0.4 mg by mouth daily., Disp:  , Rfl:  No current facility-administered medications for this visit.  Facility-Administered Medications Ordered in Other Visits:    technetium tetrofosmin (TC-MYOVIEW) injection 316.1millicurie, 309.6millicurie, Intravenous, Once PRN, Turner, Traci R, MD      Objective:   Vitals:   08/18/21 1417  BP: (!) 142/74  Pulse: 66  SpO2: 99%  Weight: 300 lb (136.1 kg)  Height: _0  (1.905 m)    Estimated body mass index is 37.5 kg/m as calculated from the following:   Height as of this encounter: _1  (1.905 m).   Weight as of this encounter: 300 lb (136.1 kg).  _2 @  Filed Weights   08/18/21 1417  Weight: 300 lb (136.1 kg)     Physical Exam General: No distress. Looks well Neuro: Alert and Oriented x 3. GCS 15. Speech normal Psych: Pleasant Resp:  Barrel Chest - no.  Wheeze - no, Crackles - maybe mild at base, No overt respiratory distress CVS: Normal heart sounds. Murmurs - no Ext: Stigmata of Connective Tissue Disease - no HEENT: Normal upper airway. PEERL +. No post nasal drip        Assessment:       ICD-10-CM   1. IPF (idiopathic pulmonary fibrosis) ([place]  J84.112     2. Encounter for therapeutic drug monitoring  Z51.81 Hepatic function panel    Hepatic function panel    3. Essential hypertension  I10     4. Elevated ALT measurement  R74.01          Plan:     Patient Instructions     ICD-10-CM   1. IPF (idiopathic pulmonary fibrosis) ([place]  J84.112     2. Encounter for therapeutic drug monitoring  Z51.81     3. Essential hypertension  I10     4. Elevated ALT measurement  R74.01  Noted recent eye surgery and recovering Clinically stable pulmonary fibrosis based on symptoms and CT scan of the chest in 1 year.  Here IM possible new side effects from ofev (started late oct/early nov 2022)   - higher BP  - sligh new increase In ALT on 08/17/21 (new since may 2022)   Plan [based on shared decision making] - -Hold off on  breathing test for the next few to several months because of eye surgery and positive pressure coming from the breathing test  -recheck LFT in our office next 1-2 weeks and again in 3-4 weeks  - send message to our office once results done so we can review  Follow-up - 3-4 weeks video visi with aPP to review BP and LFT with ofev  - if ofev fails then clinical trials next option - 8-12 weeks face to face with Sahan Pen  - 30 minutes: ILD symptom score and simple walk tstw  ( Level 05 visit: Estb 40-54 min   in  visit type: on-site physical face to visit  in total care time and counseling or/and coordination of care by this undersigned MD - Dr Brand Males. This includes one or more of the following on this same day 08/18/2021: pre-charting, chart review, note writing, documentation discussion of test results, diagnostic or treatment recommendations, prognosis, risks and benefits of management options, instructions, education, compliance or risk-factor reduction. It excludes time spent by the [place] or office staff in the care of the patient. Actual time 79 min)   SIGNATURE    Dr. Brand Males, M.D., F.C.C.P,  Pulmonary and Critical Care Medicine Staff Physician, [place] Director - Interstitial Lung Disease  Program  [facility] [place], [place], [address]  Pager: [phone], If no answer or between  15:00h - 7:00h: call [phone] Telephone: [phone]  3:10 PM 08/18/2021

## 2021-08-18 NOTE — Patient Instructions (Addendum)
ICD-10-CM   1. IPF (idiopathic pulmonary fibrosis) ([place])  J84.112     2. Encounter for therapeutic drug monitoring  Z51.81     3. Essential hypertension  I10     4. Elevated ALT measurement  R74.01       Noted recent eye surgery and recovering Clinically stable pulmonary fibrosis based on symptoms and CT scan of the chest in 1 year.  Here IM possible new side effects from ofev (started late oct/early nov 2022)   - higher BP  - sligh new increase In ALT on 08/17/21 (new since may 2022)   Plan [based on shared decision making] - -Hold off on breathing test for the next few to several months because of eye surgery and positive pressure coming from the breathing test  -recheck LFT in our office next 1-2 weeks and again in 3-4 weeks  - send message to our office once results done so we can review  Follow-up - 3-4 weeks video visi with aPP to review BP and LFT with ofev  - if ofev fails then clinical trials next option - 8-12 weeks face to face with Tasharra Nodine  - 30 minutes: ILD symptom score and simple walk tstw

## 2021-08-23 ENCOUNTER — Ambulatory Visit: Payer: Medicare HMO | Admitting: Cardiology

## 2021-09-01 ENCOUNTER — Other Ambulatory Visit (INDEPENDENT_AMBULATORY_CARE_PROVIDER_SITE_OTHER): Payer: Medicare HMO

## 2021-09-01 DIAGNOSIS — Z5181 Encounter for therapeutic drug level monitoring: Secondary | ICD-10-CM

## 2021-09-01 LAB — HEPATIC FUNCTION PANEL
ALT: 47 U/L (ref 0–53)
AST: 32 U/L (ref 0–37)
Albumin: 4 g/dL (ref 3.5–5.2)
Alkaline Phosphatase: 48 U/L (ref 39–117)
Bilirubin, Direct: 0.2 mg/dL (ref 0.0–0.3)
Total Bilirubin: 0.8 mg/dL (ref 0.2–1.2)
Total Protein: 6.9 g/dL (ref 6.0–8.3)

## 2021-09-10 ENCOUNTER — Telehealth: Payer: Medicare HMO | Admitting: Primary Care

## 2021-09-10 ENCOUNTER — Other Ambulatory Visit: Payer: Self-pay

## 2021-09-10 ENCOUNTER — Telehealth: Payer: Self-pay | Admitting: Cardiology

## 2021-09-10 DIAGNOSIS — J84112 Idiopathic pulmonary fibrosis: Secondary | ICD-10-CM

## 2021-09-10 NOTE — Telephone Encounter (Signed)
Pt c/o medication issue:  1. Name of Medication: Amlodipine  2. How are you currently taking this medication (dosage and times per day)? 1 time a day  3. Are you having a reaction (difficulty breathing--STAT)? no  4. What is your medication issue? Blood pressure is staying just about the same- his pulmonary doctor thinks he should double the Amlodipine    New Message:     Patient said he dropped off his blood pressure reading the 1 st of December to this office. He wants to make sure Dr Radford Pax received them and reviewed them?

## 2021-09-10 NOTE — Progress Notes (Signed)
Virtual Visit via Video Note  I connected with Marcus [name] on 09/10/21 at 10:30 AM EST by a video enabled telemedicine application and verified that I am speaking with the correct person using two identifiers.  Location: Patient: Home Provider: Office    I discussed the limitations of evaluation and management by telemedicine and the availability of in person appointments. The patient expressed understanding and agreed to proceed.  History of Present Illness: 80 year old male, former smoker. PMH significant for ILD, HTN, CAD, chronic cough. Patient of Dr. Chase Caller.   IPF dx 08/25/2020 (reconfirmed MDD 11/17/20) : High PRob  clinical suspicion is you have IPF based on age greater than 52, male gender, Caucasian ethnicity, negative serology profile, history of acid reflux, previous exposure to smoke and copper and prior history of smoking and prob UIP on CT.   Start esbriet Januaryc 08, 2022 - stopped summer 2022 afte sun rash x 2 times   Starting ofev  Previous [facility] pulmonary encounter: 08/18/21- Dr. Ashok Croon 80 y.o. -returns for follow-up.  In the interim because he failed pirfenidone we switched him to nintedanib.  He is tolerating it well except for some mild diarrhea but 2 weeks after starting this his blood pressure was high the cardiology visit.  I reviewed the notes.  He started nintedanib around late October 2022/early November 2022 and by mid November there was increase in blood pressure [the third patient I am seeing with this issue this year].  Cardiologist Dr. Radford Pax has added amlodipine 5 mg/day to the regimen with is not controlling the blood pressure.  I did advise him that he could contact and increase it to 10 mg/day with a side effect being edema.  He is going to look into this.  His respiratory status is stable.  He had liver function test monitoring and he has a new increase in ALT.  He is on allopurinol and Lipitor which can also increase his liver enzymes but  he has been on this for a long time.  Only new intervention is nintedanib.  Very likely and nintedanib is increasing the liver enzymes but is only a mild enzyme increase in 1 enzyme which is ALT.  He is agreed for close monitoring.  I did not ask him about his alcohol intake.  He has not had PFTs.  We are holding off on PFTs because of his recent eye surgery which has gone well but the recovery is slow but improving.   He had high-resolution CT scan of the chest recently in November 2022.  Compared to 1 year ago there is no progression.  There are no other new findings.  And CT scan of the chest in 1 year.  09/10/2021 Patient was contacted today for virtual follow-up/ Clinically stable pulmonary fibrosis. He has been on OFEV for 2 months. He was last seen by MR on 08/18/21. He had some mild diarrhea arou. BP elevated with cardiology, Dr. Radford Pax added Amlodipine 51m daily. Patient reports that his blood pressure reading this morning was 140/62 compared to previous readings in 150-160/70s. He is still having diarrhea.  He will go 2-3 days without any GI symptoms. When diarrhea is severe he will take imodium which does help. On average he takes 6 imodium tablets a week. ALT on CMET was elevated on 08/17/21. He has been on Allopurional and lipitor for a long time. He did a repeat lab test which showed LFTs which were back to normal. He need another hepatic functoin  panel in 1 week. He consumes 2-3 glasses of wine a day and 2-3 pints of beer a week.      Observations/Objective:  - Appear well; NO overt respiratory symptoms   Assessment and Plan:  Pulmonary fibrosis: - Stable; He had high-resolution CT scan of the chest recently in November 2022.  Compared to 1 year ago there is no progression.  There are no other new findings. He has been on OFEV 168m twice daily since November 2022. He is tolerating this anti-fibrotic medication significantly better than he tolerated Esbriet. His BP readings have been  lower with addition of Amlodipine. He continues to have intermittent GI symptoms that do improve with prn imodium. LFTs on 08/17/21 showed elevated ALT, repeat testing was normal. He does drink wine daily and beet on occasion, advised he cut back. He needs repeat hepatic function testing in 1 week. He has scheduled follow-up with Dr. RChase Calleron February 2nd.   HTN: - Appears improved but remains slightly elevated. Advised him to contact cardiology to see if they wanted to increase Amlodipine to 147mdaily.    Follow Up Instructions: - Dr. RaChase Callern February 2nd   I discussed the assessment and treatment plan with the patient. The patient was provided an opportunity to ask questions and all were answered. The patient agreed with the plan and demonstrated an understanding of the instructions.   The patient was advised to call back or seek an in-person evaluation if the symptoms worsen or if the condition fails to improve as anticipated.  I provided 22 minutes of non-face-to-face time during this encounter.   ElMartyn EhrichNP

## 2021-09-10 NOTE — Telephone Encounter (Signed)
Spoke with pt and advised I was unable to locate the BPs that he dropped off.  Pt still had a copy so he provided me with several readings.  Amlodipine 29m QD was added 07/27/21.    Starting 11/23: 135/66 135/69 149/65 166/78 127/68 148/74 162/73 159/75 166/79 137/69 136/64 158/74 12/30- 140/62  Pt see pulmonologist and they told pt to reach out to [place] possibly increasing Amlodipine to 134mQD.  Pt denies high BP symptoms.  Pt does have swelling in BLE that is his normal and has not worsened.  Advised I will send to Dr. TuRadford Paxor review and advisement.

## 2021-09-10 NOTE — Patient Instructions (Addendum)
Recommendations: Labs in 1 week  Contact Dr. Radford Pax about increasing Amlodipine to 38m  Continue OFEV 1575mtwice daily  Take Imodium as needed for diarrhea   Follow-up: February 2nd with Dr. RaChase Caller

## 2021-09-13 ENCOUNTER — Encounter: Payer: Self-pay | Admitting: Primary Care

## 2021-09-14 ENCOUNTER — Telehealth: Payer: Self-pay | Admitting: Pharmacist

## 2021-09-14 DIAGNOSIS — J84112 Idiopathic pulmonary fibrosis: Secondary | ICD-10-CM

## 2021-09-14 MED ORDER — AMLODIPINE BESYLATE 10 MG PO TABS: 10.0000 mg | ORAL_TABLET | Freq: Every day | ORAL | 3 refills | Status: DC

## 2021-09-14 NOTE — Telephone Encounter (Signed)
Increase Amlodipine to 15m daily and check BP daily for a week and call with results.   ----- Message -----  From: BLoren Racer RN  Sent: 09/10/2021   1:20 PM EST  To: TSueanne Margarita MD, CAntonieta Iba RN, *

## 2021-09-14 NOTE — Telephone Encounter (Signed)
Received email from patient stating he received renewal application in mail from [facility].  Advised that he must complete and also collect updated income documents to submit for renewal. He states he is going out of town in 2-3 weeks and requesting refill by then. Recommended he complete renewal application ASAP so that we can submit ASAP.  Provider portion collected with med list and insurance card copy. Placed in PAP pending info folder in pharmacy office  Knox Saliva, PharmD, MPH, BCPS Clinical Pharmacist (Rheumatology and Pulmonology)

## 2021-09-14 NOTE — Telephone Encounter (Signed)
Spoke with the patient and advised him to increase amlodipine to 10 mg daily. Patient verbalized understanding. He will continue taking his BP and update [place] with readings after increased dose.

## 2021-09-15 ENCOUNTER — Other Ambulatory Visit (INDEPENDENT_AMBULATORY_CARE_PROVIDER_SITE_OTHER): Payer: Medicare HMO

## 2021-09-15 ENCOUNTER — Encounter: Payer: Self-pay | Admitting: Internal Medicine

## 2021-09-15 DIAGNOSIS — Z5181 Encounter for therapeutic drug level monitoring: Secondary | ICD-10-CM | POA: Diagnosis not present

## 2021-09-15 LAB — HEPATIC FUNCTION PANEL
ALT: 60 U/L — ABNORMAL HIGH (ref 0–53)
AST: 38 U/L — ABNORMAL HIGH (ref 0–37)
Albumin: 4 g/dL (ref 3.5–5.2)
Alkaline Phosphatase: 48 U/L (ref 39–117)
Bilirubin, Direct: 0.2 mg/dL (ref 0.0–0.3)
Total Bilirubin: 0.9 mg/dL (ref 0.2–1.2)
Total Protein: 7.1 g/dL (ref 6.0–8.3)

## 2021-09-15 NOTE — Telephone Encounter (Addendum)
Submitted Patient Assistance RENEWAL Application to [employer] for OFEV along with provider portion, patient poriton, insurance card copy, med list, and income documents. Will update patient when we receive a response.  Fax# [phone] Phone# [phone]  Copy of renewal application already sent to patient's chart (media tab)  Knox Saliva, PharmD, MPH, BCPS Clinical Pharmacist (Rheumatology and Pulmonology)

## 2021-09-15 NOTE — Telephone Encounter (Signed)
He saw [facility] 12/30 ad 12.21 LFT normal. Asked to cut back on wine. Not sure if he did buecase LFT up again borderline  Plan  - elicit etoh history over new years - ensure cut down with etoh - check LFT again in 7-10 days but well before next visit - no change in medicine   Recent Labs  Lab 09/15/21 0859  AST 38*  ALT 60*  ALKPHOS 48  BILITOT 0.9  PROT 7.1  ALBUMIN 4.0    Latest Reference Range & Units 07/22/13 09:18 01/27/14 07:56 08/01/14 08:35 04/18/16 09:22 10/28/20 11:03 12/08/20 14:31 01/19/21 13:59 08/17/21 09:16 09/01/21 11:46 09/15/21 08:59  AST 0 - 37 U/L   34  32 28 31 35 32 38 (H)  ALT 0 - 53 U/L 67 (H) 74 (H) 41 47 (H) 50 37 38 52 (H) 47 60 (H)  (H): Data is abnormally high

## 2021-09-16 NOTE — Telephone Encounter (Signed)
MR, please see pt's email response. Thanks.

## 2021-09-17 MED ORDER — OFEV 150 MG PO CAPS: 150.0000 mg | ORAL_CAPSULE | Freq: Two times a day (BID) | ORAL | 1 refills | Status: DC

## 2021-09-17 NOTE — Telephone Encounter (Signed)
Received verbal confirmation from rep that patient is approved for Ofev through [employer] patient assistance from 09/17/21 through 09/11/22. They will fax approval letter to clinic.  Knox Saliva, PharmD, MPH, BCPS Clinical Pharmacist (Rheumatology and Pulmonology)

## 2021-09-17 NOTE — Telephone Encounter (Signed)
Ok thanks 

## 2021-09-22 NOTE — Telephone Encounter (Signed)
Noted.   Will route to [facility].

## 2021-09-22 NOTE — Telephone Encounter (Signed)
Rx for Ofev clarified with [employer]. They will reach out to patient to schedule shipment. Nothing further needed. Closing encounter.  Knox Saliva, PharmD, MPH, BCPS Clinical Pharmacist (Rheumatology and Pulmonology)

## 2021-09-22 NOTE — Telephone Encounter (Signed)
Patient would like a call from [facility]. [phone]

## 2021-09-27 ENCOUNTER — Other Ambulatory Visit (INDEPENDENT_AMBULATORY_CARE_PROVIDER_SITE_OTHER): Payer: Medicare HMO

## 2021-09-27 DIAGNOSIS — Z5181 Encounter for therapeutic drug level monitoring: Secondary | ICD-10-CM

## 2021-09-27 LAB — CBC WITH DIFFERENTIAL/PLATELET
Basophils Absolute: 0.1 10*3/uL (ref 0.0–0.1)
Basophils Relative: 0.5 % (ref 0.0–3.0)
Eosinophils Absolute: 0.4 10*3/uL (ref 0.0–0.7)
Eosinophils Relative: 3.3 % (ref 0.0–5.0)
HCT: 40.6 % (ref 39.0–52.0)
Hemoglobin: 13 g/dL (ref 13.0–17.0)
Lymphocytes Relative: 11.2 % — ABNORMAL LOW (ref 12.0–46.0)
Lymphs Abs: 1.2 10*3/uL (ref 0.7–4.0)
MCHC: 32.1 g/dL (ref 30.0–36.0)
MCV: 102.6 fl — ABNORMAL HIGH (ref 78.0–100.0)
Monocytes Absolute: 0.8 10*3/uL (ref 0.1–1.0)
Monocytes Relative: 7.1 % (ref 3.0–12.0)
Neutro Abs: 8.7 10*3/uL — ABNORMAL HIGH (ref 1.4–7.7)
Neutrophils Relative %: 77.9 % — ABNORMAL HIGH (ref 43.0–77.0)
Platelets: 208 10*3/uL (ref 150.0–400.0)
RBC: 3.96 Mil/uL — ABNORMAL LOW (ref 4.22–5.81)
RDW: 15.1 % (ref 11.5–15.5)
WBC: 11.2 10*3/uL — ABNORMAL HIGH (ref 4.0–10.5)

## 2021-09-27 LAB — HEPATIC FUNCTION PANEL
ALT: 37 U/L (ref 0–53)
AST: 25 U/L (ref 0–37)
Albumin: 4 g/dL (ref 3.5–5.2)
Alkaline Phosphatase: 47 U/L (ref 39–117)
Bilirubin, Direct: 0.2 mg/dL (ref 0.0–0.3)
Total Bilirubin: 0.8 mg/dL (ref 0.2–1.2)
Total Protein: 6.9 g/dL (ref 6.0–8.3)

## 2021-10-14 ENCOUNTER — Ambulatory Visit: Payer: Medicare HMO | Admitting: Internal Medicine

## 2021-10-14 ENCOUNTER — Encounter: Payer: Self-pay | Admitting: Internal Medicine

## 2021-10-14 ENCOUNTER — Other Ambulatory Visit: Payer: Self-pay

## 2021-10-14 VITALS — BP 130/72 | HR 63 | Temp 98.0°F | Ht 75.0 in | Wt 302.2 lb

## 2021-10-14 DIAGNOSIS — Z5181 Encounter for therapeutic drug level monitoring: Secondary | ICD-10-CM

## 2021-10-14 DIAGNOSIS — J84112 Idiopathic pulmonary fibrosis: Secondary | ICD-10-CM

## 2021-10-14 DIAGNOSIS — K521 Toxic gastroenteritis and colitis: Secondary | ICD-10-CM

## 2021-10-14 NOTE — Progress Notes (Signed)
IOV 06/09/2020  Subjective:  Patient ID: Marcus [name], male , DOB: August 13, 1941 , age 81 y.o. , MRN: [id number] , ADDRESS: [address] [place] [address]  PCP Hulan Fess, MD Cardiologist Dr. Golden Hurter   06/09/2020 -   Chief Complaint  Patient presents with   Consult    sob for the last year, worse for the last 6 month.     HPI Marcus [name] 81 y.o. -81 year old male with a history of atherosclerotic coronary artery disease [status post remote PCI of LAD], hypertension and hyperlipidemia.  History of transaminitis that resolved after stopping ethanol and Tylenol.  Lower extremity Dopplers in 2017 30-49% left common femoral arterial stenosis.  He saw Dr. Radford Pax cardiology in May 2021 with new onset of shortness of breath that is progressive.  Associated with exertional chest pressure.  At baseline he goes to [place] a lot and walks 3 miles [place] the beach but as of May 2021 he was only able to walk 5 feet and had to stop halfway through that walk.  Also notices walking up stairs relieved by rest.  Overall dyspnea on exertion for 1 year.  Worse in the last 6 months but he also says that he some better in the last few months.  Denies any proximal nocturnal dyspnea orthopnea or lower extremity edema dizziness or palpitations or syncope.  Because of this he underwent nuclear medicine cardiac stress test February 14, 2020 that was low risk with ejection fraction 65%.  But he has had persistent symptoms and therefore has been referred here.  He then had pulmonary function test April 02, 2020 that shows mild restriction with FVC 3.82 L / 75% total lung capacity 5.76/71% but normal DLCO.  But because of persistent symptoms has been referred here.  Of note his echo showed grade 1 diastolic dysfunction.  Of note he also has a chronic cough during the same time.  -He tells me that recently his [facility] duct died out and after that the cough improved when the [facility] duct was off.  He is  now going to get his [facility] duct evaluated.    He also reports 10 pound weight gain during this time.  Normally is around 290 pounds.  Now he is at 300 pounds.  He is also been more sedentary because of the pandemic.  He says during this time his wife has noticed that he occasionally snores.  He does fall easily fatigued during the daytime.  He also has disrupted sleep pattern.  I did a stop bang questionnaire and it is 6 out of 8 suggesting significantly elevated risk for sleep apnea.  He has never had a sleep study.`  He is going back to [place], [place] where he owns another home.  He will not be able to do test in the next few weeks.   Results for [name], [name] (MRN [id number]) as of 06/09/2020 14:16  Ref. Range 03/23/2017 09:13  Please Note (HCV): Unknown Comment  Anti Nuclear Antibody (ANA) Latest Ref Range: Negative  Negative  ANCA Proteinase 3 Latest Ref Range: 0.0 - 3.5 U/mL <3.5  Atypical pANCA Latest Ref Range: Neg:<1:20 titer <1:20  ENA SSA (RO) Ab Latest Ref Range: 0.0 - 0.9 AI <0.2  ENA SSB (LA) Ab Latest Ref Range: 0.0 - 0.9 AI <0.2  Deamidated Gliadin Abs, IgG Latest Ref Range: 0 - 19 units 2  Myeloperoxidase Ab Latest Ref Range: 0.0 - 9.0 U/mL <9.0  RA  Latex Turbid. Latest Ref Range: 0.0 - 13.9 IU/mL <10.0  Transglutaminase IgA Latest Ref Range: 0 - 3 U/mL <2  Cytoplasmic (C-ANCA) Latest Ref Range: Neg:<1:20 titer <1:20  P-ANCA Latest Ref Range: Neg:<1:20 titer <1:20  Antigliadin Abs, IgA Latest Ref Range: 0 - 19 units 6  IgG (Immunoglobin G), Serum Latest Ref Range: 700 - 1,600 mg/dL 825  IgM (Immunoglobulin M), Srm Latest Ref Range: 15 - 143 mg/dL 126  IgA/Immunoglobulin A, Serum Latest Ref Range: 61 - 437 mg/dL 312  Lyme IgG/IgM Ab Latest Ref Range: 0.00 - 0.90 ISR <0.91    NP visit Nov 2021   07/23/2020  - Visit   81 year old male former smoker followed in our office for dyspnea on exertion and chronic cough.  He is established with Dr. Chase Caller.  He was seen for an  initial consult in September/2021.  Plan of care from that consult with Dr. Chase Caller was as follows: Obtain high-resolution CT chest, do simple walk test, CBC with differential, IgE.  Stop bang score 6.  Was referred to Dr. Radford Pax for sleep study.  Patient presenting today as follow-up.  Patient reporting that he has been scheduled for home sleep study with cardiology on 08/11/2020.  Insurance would not pay for an in lab study.  Patient denies any sort of family history of lung disease.  He does report potential exposures when he was in the service for 2 years 1 of those years being in [place].  He reports a previous history of a primary care provider reporting that when he returned he may have had malaria and he was on malaria medications.  He is unsure of the validity of this.  Patient also worked in copper rod production for around 3 years.  He was around acid tanks for around 1 to 2 years during the rod washing.  He did not wear a mask.  He reports there is lots of smoke, copper dust as well as he to wear special close due to the exposures of the acid.  Patient is also currently dealing with an HVAC issue.  There is concerned there may be mold in this.  Both him and his wife have had respiratory symptoms of increased cough when the machine was running.  Is currently turned off and is being prepared to be cleaned.  He reports that the initial inspection by the HVAC team felt that there was no mold present.  Patient denies any persistent issues of acid reflux.  He does have a history of GERD and has a history of hiatal hernia.  He was treated with Nexium for around 6 to 8 weeks.  He reports he has been off this medication for many years.  He does not have breakthrough reflux symptoms or require Tums.  Patient continues to have aspects of shortness of breath occasionally.  Especially with physical exertion.  Patient also has an occasional cough.  Cough is more present in the morning when he is waking up.   Sometimes cough is productive.  He does have significant clear nasal drainage.  He is occasionally using a nasal medication that he believes is Flonase that his wife purchased over-the-counter.   OV 08/25/2020   Subjective:  Patient ID: Marcus [name], male , DOB: December 12, 1940, age 21 y.o. years. , MRN: [id number],  ADDRESS: [address] [place] [address] PCP  Hulan Fess, MD Providers : Treatment Team:  Attending Provider: Brand Males, MD Patient Care Team: Hulan Fess, MD as PCP - General (  Family Medicine) Sueanne Margarita, MD as PCP - Cardiology (Cardiology)    Chief Complaint  Patient presents with   Follow-up    ILD, doing ok, occ cough with some mucous       HPI Marcus [name] 81 y.o. -returns for follow-up to discuss his test results.  He is here with his wife who I am meeting for the first time.  His high-resolution CT chest is interpreted as indeterminate for UIP.  I personally visualized this I personally thought it was probable UIP.  I took a second opinion from Dr. Carilyn Goodpasture thoracic radiologist who feels that patient could get classified as probable UIP on the CT scan.  Long discussion was held with the patient but he did not bring his ILD questionnaire.  He had to bring this after the visit.  Therefore parts of this dictation happened after his visit but on the same day.  His main concern is that he will die prematurely.  [place] Integrated Comprehensive ILD Questionnaire  Symptoms: He tells me that he had insidious onset of shortness of breath for the last 12 months.  Since it started it is the same.  He has level 1 dyspnea for household work, shopping and walking at a level pace.  Level 2 dyspnea on walking up stairs.  He does have difficulty keeping up with others of his age.  He has neuropathy and hip soreness.  He also has a cough since March 2021.  It is the same since it started moderate in severity.  It is dry.  He does clear his throat and he  does feel a tickle in his throat.  There is no hemoptysis or sputum production.  No wheezing no nausea no vomiting no diarrhea.  The cough is mostly in the morning.  His symptoms do feel better when he stops activity for 5 minutes.  It makes him feel more short of breath when he does vigorous activity.  No associated symptoms.   Past Medical History : He has a history of coronary artery disease with 1 stent. He is undergoing a sleep apnea work-up.  He has chronic kidney disease stage III according to his history. Malaria in [place]. Polio as a kid   He does not have rheumatoid arthritis.  No scleroderma no lupus no polymyositis no Sjogren's.  No other vasculitis.  No thyroid disease no diabetes.  No stroke no seizures.  No hepatitis no pneumonia.  His creatinine in May 2021 was 1.58 mg percent with a GFR of 42-48.  In October 2019 his creatinine was 1.35 mg percent.   Of note he says his dental hygienist has noted persistent redness in his hard palate.  He is asking me to refer him to ENT specialist and I have obliged  ROS: He does have arthralgia related to gout.  He has seborrhea.  He denies any oral ulcers or heartburn or vomiting or nausea.  Denies any weight loss.  He has obesity.  No recurrent fever no dryness.  No fatigue   FAMILY HISTORY of LUNG DISEASE: No pulmonary fibrosis no COPD.  His sister has autoimmune disease Brother has asthma   EXPOSURE HISTORY: He smokes cigarettes between 1960 and 1966 2 packs.  He also smoked cigars for 20 years.  No passive smoking.  No electronic cigarettes no marijuana no vaping no cocaine no intravenous drug use. Was      HOME and HOBBY DETAILS : Single-family home [place] for the last 10  years.  Age of the home is 25 years.  It is a 5 bedroom house.  There is no dampness.  No mold or mildew no humidifier use no CPAP use no nebulizer use.  No steam iron use no Jacuzzi use no misting Fountain.  No pet birds or parakeets no pet  gerbils or hamsters.  No feather pillows no mold in the [facility] duct.  No music habits.  No gardening habits.  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  During this time he was exposed to gas fumes and chemicals he reports that sulfuric acid copper out washing.  Otherwise negative for organic and inorganic antigens.   PULMONARY TOXICITY HISTORY (27 items): Negative     Radiology below were read by Dr. Lorin Picket is indeterminate for UIP.  My personal opinion this is probable UIP.  Dr. Carilyn Goodpasture radiologist can also concur with me it is probable UIP   Narrative & Impression  CLINICAL DATA:  Shortness of breath. Productive cough and shortness of breath exertion for approximately 1 year.   EXAM: CT CHEST WITHOUT CONTRAST   TECHNIQUE: Multidetector CT imaging of the chest was performed following the standard protocol without intravenous contrast. High resolution imaging of the lungs, as well as inspiratory and expiratory imaging, was performed.   COMPARISON:  None.   FINDINGS: Cardiovascular: Atherosclerotic calcification of the aorta, aortic valve and coronary arteries. Pulmonic trunk is enlarged. Heart is at the upper limits of normal in size to mildly enlarged. No pericardial effusion.   Mediastinum/Nodes: No pathologically enlarged mediastinal or axillary lymph nodes. Hilar regions are difficult to evaluate without IV contrast. Esophagus is unremarkable.   Lungs/Pleura: Mild basilar predominant subpleural reticulation, ground-glass and traction bronchiectasis/bronchiolectasis. Findings persist on prone imaging. No definitive honeycombing. No air trapping. 3 mm left upper lobe nodule (3/57). 4 mm peripheral left lower lobe nodule (3/106). Mild centrilobular emphysema. No pleural fluid. Airway is unremarkable.   Upper Abdomen: Visualized portions of the liver,  gallbladder, adrenal glands, kidneys, spleen, pancreas, stomach and bowel are grossly unremarkable. No upper abdominal adenopathy.   Musculoskeletal: Degenerative changes in the spine.   IMPRESSION: 1. Mild basilar predominant subpleural fibrosis may be due to nonspecific interstitial pneumonitis or usual interstitial pneumonitis. Findings are indeterminate for UIP per consensus guidelines: Diagnosis of Idiopathic Pulmonary Fibrosis: An Official ATS/ERS/JRS/ALAT Clinical Practice Guideline. [place], Iss 5, [phone], May 13 2017. 2. Pulmonary nodules measure up to 4 mm. No follow-up needed if patient is low-risk (and has no known or suspected primary neoplasm). Non-contrast chest CT can be considered in 12 months if patient is high-risk. This recommendation follows the consensus statement: Guidelines for Management of Incidental Pulmonary Nodules Detected on CT Images: From the Fleischner Society 2017; Radiology 2017; 284:228-243. 3. Aortic atherosclerosis (ICD10-I70.0). Coronary artery calcification. 4. Enlarged pulmonic trunk, indicative of pulmonary arterial hypertension. 5.  Emphysema (ICD10-J43.9).     Electronically Signed   By: Lorin Picket M.D.   On: 07/15/2020 14:33    Results for CHIN, WACHTER (MRN [id number]) as of 08/25/2020 10:28  Ref. Range 07/23/2020 09:56 07/23/2020 09:58  Anti Nuclear Antibody (ANA) Latest Ref Range: NEGATIVE  POSITIVE (A)   ANA Pattern 1 Unknown Nuclear, Homogeneous (A)   ANA Titer 1 Latest Units: titer 2:86 (H)   Cyclic Citrullin Peptide Ab Latest Units: UNITS <16  RA Latex Turbid. Latest Ref Range: <14 IU/mL <14   ANA,IFA RA DIAG PNL W/RFLX TIT/PATN Unknown Rpt (A)   Anti-Jo-1 Ab (RDL) Latest Ref Range: <20 Units  <20  Anti-PL-7 Ab (RDL) Latest Ref Range: Negative   Negative  Anti-PL-12 Ab (RDL) Latest Ref Range: Negative   Negative  Anti-EJ Ab (RDL) Latest Ref Range: Negative   Negative  Anti-OJ Ab (RDL)  Latest Ref Range: Negative   Negative  Anti-SRP Ab (RDL) Latest Ref Range: Negative   Negative  Anti-Mi-2 Ab (RDL) Latest Ref Range: Negative   Negative  Anti-TIF-1gamma Ab (RDL) Latest Ref Range: <20 Units  <20  Anti-MDA-5 Ab (CADM-140)(RDL) Latest Ref Range: <20 Units  <20  Anti-NXP-2 (P140) Ab (RDL) Latest Ref Range: <20 Units  <20  Anti-SAE1 Ab, IgG (RDL) Latest Ref Range: <20 Units  <20  Anti-PM/Scl-100 Ab (RDL) Latest Ref Range: <20 Units  <20  Anti-Ku Ab (RDL) Latest Ref Range: Negative   Negative  Anti-SS-A 52kD Ab, IgG (RDL) Latest Ref Range: <20 Units  <20  Anti-U1 RNP Ab (RDL) Latest Ref Range: <20 Units  <20  Anti-U2 RNP Ab (RDL) Latest Ref Range: Negative   Negative  Anti-U3 RNP (Fibrillarin)(RDL) Latest Ref Range: Negative   Negative  SSA (Ro) (ENA) Antibody, IgG Latest Ref Range: <1.0 NEG AI <1.0 NEG   SSB (La) (ENA) Antibody, IgG Latest Ref Range: <1.0 NEG AI <1.0 NEG   Scleroderma (Scl-70) (ENA) Antibody, IgG Latest Ref Range: <1.0 NEG AI <1.0 NEG     OV 10/28/2020  Subjective:  Patient ID: Marcus [name], male , DOB: [birth year] , age 56 y.o. , MRN: [id number] , ADDRESS: [address] [place] [address] PCP Hulan Fess, MD Patient Care Team: Hulan Fess, MD as PCP - General (Family Medicine) Sueanne Margarita, MD as PCP - Cardiology (Cardiology)  This Provider for this visit: Treatment Team:  Attending Provider: Brand Males, MD    10/28/2020 -   Chief Complaint  Patient presents with   Follow-up    Get pft results.  Gets bloody nose when he blows his nose, small amount of blood, just since starting Esbriet.    IPF dx 08/25/2020: High PRob  clinical suspicion is you have IPF based on age greater than 53, male gender, Caucasian ethnicity, negative serology profile, history of acid reflux, previous exposure to smoke and copper and prior history of smoking and prob UIP on CT.  Start esbriet Januaryc 08, 2022 HPI Marcus [name] 81 y.o. -follow-up  idiopathic pulmonary fibrosis new diagnosis December 2021. We started him on pirfenidone January 2022. He says he is tolerating it really well. His symptom score is stable. In fact he feels that pirfenidone is helping him. He feels his lungs have opened up. He feels that the antifibrotic actions have kicked in. He does admit this could be psychological. He did complain of some bloody nose when he blows up. He wondered if it is because of pirfenidone. Explained to him pirfenidone does not have any antiplatelet agents. Explained that likely this is due to the cold winter and dry air. He shuttles between [place] and [place]. Did explain to him that if he were to relocate to [place] I would set him up with excellent ILD specialist at [facility]. We discussed patient support group. He seems interested. We discussed pulmonary rehabilitation but he has back and spine issues and knee issues that make it difficult. He wants to reflect on this. We discussed clinical trials as a care  option including specifically all the details below. He wants to reflect on this but he feels that shuttling between [place] in [place] can be difficult to do clinical trials. However once is stable on the drug and sometimes past he might be able to do it.  There are no other new issues. Last visit I did indicate to him to consider stopping his omega-3 because of concern of omega-3 making acid reflux worse which is a risk factor for IPF. However he says it has helped his triglycerides. He wants to continue on this. His acid reflux is not super active. I have supported him in this decision.     PFT    OV 12/08/2020  Subjective:  Patient ID: Marcus [name], male , DOB: 1940-10-26 , age 62 y.o. , MRN: [id number] , ADDRESS: [address] [place] [address] PCP Hulan Fess, MD Patient Care Team: Hulan Fess, MD as PCP - General (Family Medicine) Sueanne Margarita, MD as PCP - Cardiology (Cardiology)  This  Provider for this visit: Treatment Team:  Attending Provider: Brand Males, MD   IPF dx 08/25/2020 (reconfirmed MDD 11/17/20) : High PRob  clinical suspicion is you have IPF based on age greater than 51, male gender, Caucasian ethnicity, negative serology profile, history of acid reflux, previous exposure to smoke and copper and prior history of smoking and prob UIP on CT.   Start esbriet Jory Sims 08, 2022   12/08/2020 -   Chief Complaint  Patient presents with   Follow-up    Pt states he is doing okay since last visit and states breathing is about the same.     HPI Marcus [name] 81 y.o. -+ presents for follow-up.  Last visit was 1 month ago.  This visit was supposed to be a televisit but he showed up at the front door.  Therefore he preferred to do a face-to-face visit.  We discussed in the clinical conference and confirmation is that he is IPF.  He says he is tolerating pirfenidone well.  He is going to start his third month of pirfenidone in the first week of April 2022.  He says occasionally bites his tongue at night.  This is associated with starting losartan 6 months ago but is not on ACE inhibitor.  He will discuss this with the cardiologist.  Also says that tongue biting started after the pirfenidone but it is not a known side effect of pirfenidone.  Overall he is feeling stable.  He is not interested in clinical trials of pulmonary rehabilitation at this point.  He shuttling between [place] in [place].  His symptom scores are listed below.    Telel visit 01/19/21  81 yo former smoker followed for IPF Medical history significant for coronary artery disease, hypertension and hyperlipidemia.  Today's telemedicine visit is for a 6-week follow-up.  Patient has presumed IPF with interstitial changes consistent with UIP on high-resolution CT chest.  Patient was started on Esbriet in January 2022.  Patient says he is tolerating well.  He has had no nausea vomiting or diarrhea.   Appetite is good no weight loss.  Patient says he still gets short of breath with activities but tries to remain active.  He has minimum cough. Patient aware that he needs to return for lab work today.  Patient denies any flare of cough or shortness of breath.  No change in his activity level.    OV 05/28/2021  Subjective:  Patient ID: Marcus [name], male , DOB: 27-Jun-1941 , age  53 y.o. , MRN: [id number] , ADDRESS: [address] [place] [address] PCP Hulan Fess, MD Patient Care Team: Hulan Fess, MD as PCP - General (Family Medicine) Sueanne Margarita, MD as PCP - Cardiology (Cardiology)  This Provider for this visit: Treatment Team:  Attending Provider: Brand Males, MD    05/28/2021 -   Chief Complaint  Patient presents with   Follow-up    Pt states he has been doing okay since last visit. States that he stopped taking Esbriet about a month ago. Pt states that his breathing is about the same.    IPF dx 08/25/2020 (reconfirmed MDD 11/17/20) : High PRob  clinical suspicion is you have IPF based on age greater than 69, male gender, Caucasian ethnicity, negative serology profile, history of acid reflux, previous exposure to smoke and copper and prior history of smoking and prob UIP on CT.   Start esbriet Januaryc 08, 2022 - stopped summer 2022 afte sun rash x 2 times   Starting ofev  HPI Marcus [name] 81 y.o. -       OV 08/18/2021  Subjective:  Patient ID: Marcus [name], male , DOB: May 18, 1941 , age 51 y.o. , MRN: [id number] , ADDRESS: [address] [place] [address] PCP Hulan Fess, MD Patient Care Team: Hulan Fess, MD as PCP - General (Family Medicine) Sueanne Margarita, MD as PCP - Cardiology (Cardiology)  This Provider for this visit: Treatment Team:  Attending Provider: Brand Males, MD    08/18/2021 -   Chief Complaint  Patient presents with   Follow-up    F/U after starting Ofev. States his BP has increased since  starting Ofev on November 1st. Has had occasional episodes of diarrhea.     IPF dx 08/25/2020 (reconfirmed MDD 11/17/20) : High PRob  clinical suspicion is you have IPF based on age greater than 29, male gender, Caucasian ethnicity, negative serology profile, history of acid reflux, previous exposure to smoke and copper and prior history of smoking and prob UIP on CT.   Start esbriet Januaryc 08, 2022 - stopped summer 2022 afte sun rash x 2 times   Starting ofev  HPI NIKE 81 y.o. -returns for follow-up.  In the interim because he failed pirfenidone we switched him to nintedanib.  He is tolerating it well except for some mild diarrhea but 2 weeks after starting this his blood pressure was high the cardiology visit.  I reviewed the notes.  He started nintedanib around late October 2022/early November 2022 and by mid November there was increase in blood pressure [the third patient I am seeing with this issue this year].  Cardiologist Dr. Radford Pax has added amlodipine 5 mg/day to the regimen with is not controlling the blood pressure.  I did advise him that he could contact and increase it to 10 mg/day with a side effect being edema.  He is going to look into this.  His respiratory status is stable.  He had liver function test monitoring and he has a new increase in ALT.  He is on allopurinol and Lipitor which can also increase his liver enzymes but he has been on this for a long time.  Only new intervention is nintedanib.  Very likely and nintedanib is increasing the liver enzymes but is only a mild enzyme increase in 1 enzyme which is ALT.  He is agreed for close monitoring.  I did not ask him about his alcohol intake.  He has not had PFTs.  We  are holding off on PFTs because of his recent eye surgery which has gone well but the recovery is slow but improving.   He had high-resolution CT scan of the chest recently in November 2022.  Compared to 1 year ago there is no progression.  There are no  other new findings.  And CT scan of the chest in 1 year.  Recent Labs  Lab 08/17/21 0916  AST 35  ALT 52*  ALKPHOS 60  BILITOT 0.5  PROT 6.5  ALBUMIN 4.3      CT Chest data 07/27/21  CLINICAL DATA:  81 year old male with history of interstitial lung disease. Follow-up study.   EXAM: CT CHEST WITHOUT CONTRAST   TECHNIQUE: Multidetector CT imaging of the chest was performed following the standard protocol without intravenous contrast. High resolution imaging of the lungs, as well as inspiratory and expiratory imaging, was performed.   COMPARISON:  Chest CT 07/15/2020.   FINDINGS: Cardiovascular: Heart size is normal. There is no significant pericardial fluid, thickening or pericardial calcification. There is aortic atherosclerosis, as well as atherosclerosis of the great vessels of the mediastinum and the coronary arteries, including calcified atherosclerotic plaque in the left main, left anterior descending, left circumflex and right coronary arteries.   Mediastinum/Nodes: No pathologically enlarged mediastinal or hilar lymph nodes. Please note that accurate exclusion of hilar adenopathy is limited on noncontrast CT scans. Esophagus is unremarkable in appearance. No axillary lymphadenopathy.   Lungs/Pleura: High-resolution images demonstrate areas of ground-glass attenuation, septal thickening, mild subpleural reticulation, mild cylindrical bronchiectasis, thickening of the peribronchovascular interstitium and regional architectural distortion and volume loss. These findings are most evident in the lung bases, particularly in the lower lobes. Bronchiectasis is now more apparent in these regions, but no other significant progression of disease when compared to the prior study. No honeycombing. Inspiratory and expiratory imaging is unremarkable.   Upper Abdomen: Aortic atherosclerosis.   Musculoskeletal: There are no aggressive appearing lytic or blastic lesions  noted in the visualized portions of the skeleton.   IMPRESSION: 1. The appearance of the lungs remains compatible with interstitial lung disease, with a spectrum of findings categorized as probable usual interstitial pneumonia (UIP) per current ATS guidelines. No significant progression of disease compared to the prior study. 2. Aortic atherosclerosis, in addition to left main and 3 vessel coronary artery disease. Please note that although the presence of coronary artery calcium documents the presence of coronary artery disease, the severity of this disease and any potential stenosis cannot be assessed on this non-gated CT examination. Assessment for potential risk factor modification, dietary therapy or pharmacologic therapy may be warranted, if clinically indicated.   Aortic Atherosclerosis (ICD10-I70.0).     Electronically Signed   By: Vinnie Langton M.D.   On: 07/28/2021 10:58    No results found.    09/10/2021 Patient was contacted today for virtual follow-up/ Clinically stable pulmonary fibrosis. He has been on OFEV for 2 months. He was last seen by MR on 08/18/21. He had some mild diarrhea arou. BP elevated with cardiology, Dr. Radford Pax added Amlodipine 35m daily. Patient reports that his blood pressure reading this morning was 140/62 compared to previous readings in 150-160/70s. He is still having diarrhea.  He will go 2-3 days without any GI symptoms. When diarrhea is severe he will take imodium which does help. On average he takes 6 imodium tablets a week. ALT on CMET was elevated on 08/17/21. He has been on Allopurional and lipitor for a long time. He did  a repeat lab test which showed LFTs which were back to normal. He need another hepatic functoin panel in 1 week. He consumes 2-3 glasses of wine a day and 2-3 pints of beer a week.       OV 10/14/2021  Subjective:  Patient ID: Marcus [name], male , DOB: November 13, 1940 , age 54 y.o. , MRN: [id number] , ADDRESS: [address] [place] [address] PCP Hulan Fess, MD Patient Care Team: Hulan Fess, MD as PCP - General (Family Medicine) Sueanne Margarita, MD as PCP - Cardiology (Cardiology)  This Provider for this visit: Treatment Team:  Attending Provider: Brand Males, MD    10/14/2021 -  9 IPF follow-up on nintedanib.  Previous intolerance to pirfenidone. Chief Complaint  Patient presents with   Follow-up    Pt states he is about the same since last visit.     HPI Marcus [name] 81 y.o. -returns for follow-up.  He has intermittent mild transaminitis while on nintedanib.  This is no attributable to alcohol and also Lipitor.  Most recent liver function test is normal.  However he is having significant diarrhea.  He uses 6-7 Imodium each week.  He wants to stop nintedanib and give it a holiday for a few weeks and then reassess.  He did admit to drinking 2 cups of coffee a day and also 1 soda a week.  Beyond this he denies any other increased sugar intake or carbohydrate intake.  He does have high triglycerides and is on fish oil.  I recommended he stop fish oil and avoid coffee to help with his diarrhea.  He says he will stop the fish oil at least temporarily although is worried about the impact on his triglycerides.  I did indicate to him the best treatment for hypertriglyceridemia is low carbohydrate food.  In any event he wants to give nintedanib holiday.  We agreed to do a 2-week holiday.  He from a respiratory standpoint is stable.  His walking desaturation test is stable.  We are holding off on pulmonary function test at the moment because of recent cataract surgery.  He does have significant cough and is on Mucinex.  He says he has had 4 shots of COVID-vaccine.  Is wondering whether he should get another booster.  I indicated to him the current active strain is from [place] the [place] strain.  There is no mRNA vaccine for this.  Therefore we took a shared decision making to hold off.  However if he got  COVID we will do antiviral.    CT Chest data  No results found.     SYMPTOM SCALE -  06/09/2020  08/25/2020  10/28/2020  12/08/2020  08/18/2021  10/14/2021   O2 use _0  a  Shortness of Breath 0 -> 5 scale with 5 being worst (score 6 If unable to do)    0   At rest 0 0 0 0 1.5 0  Simple tasks - showers, clothes change, eating, shaving 1 0 _1 Household (dishes, doing bed, laundry) _2 Shopping _3 1. 2  Walking level at own pace _4 1.5 2  Walking up Stairs _5 Total (30-36) Dyspnea Score _6 How bad is your cough? _7 How bad is your fatigue _8 2  How bad is nausea 0 0 0 0 0 0  How bad is vomiting?  00 0 0 0 0 0  How bad is diarrhea? 0 0 0 0 2 3  How bad is anxiety? 0 0 0 0 0 0  How bad is depression 0 0 0 0 0 0       Simple office walk 185 feet x  3 laps goal with forehead probe 06/09/2020  08/25/2020  10/28/2020  05/28/2021  10/14/2021   O2 used ra  ra ra ra  Number laps completed _0 Comments about pace nl 100% and 64/min moderate  avg  Resting Pulse Ox/HR 100% and 61/min  100% and 63 100% and 66 100% and 66  Final Pulse Ox/HR 97% and 98/min 98% and 80/min 99% and 100 98% and 1112 99% and 107  Desaturated </= 88% no      Desaturated <= 3% points yes      Got Tachycardic >/= 90/min yes      Symptoms at end of test x  Mod desypnea  Mild dyspnea  Miscellaneous comments x  Dyspnea started lap 1.NEver stopped      PFT  PFT Results Latest Ref Rng & Units 10/28/2020 04/02/2020  FVC-Pre L 3.69 3.82  FVC-Predicted Pre % 74 75  FVC-Post L 3.68 3.85  FVC-Predicted Post % 73 76  Pre FEV1/FVC % % 80 80  Post FEV1/FCV % % 83 83  FEV1-Pre L 2.94 3.04  FEV1-Predicted Pre % 81 83  FEV1-Post L 3.06 3.20  DLCO uncorrected ml/min/mmHg 21.84 23.34  DLCO UNC% % 76 81  DLCO corrected ml/min/mmHg 21.84 23.34  DLCO COR %Predicted % 76 81  DLVA Predicted % 99 103  TLC L 5.72 5.76  TLC % Predicted % 70  71  RV % Predicted % 72 72     Latest Reference Range & Units 07/22/13 09:18 01/27/14 07:56 08/01/14 08:35 04/18/16 09:22 10/28/20 11:03 12/08/20 14:31 01/19/21 13:59 08/17/21 09:16 09/01/21 11:46 09/15/21 08:59 09/27/21 09:23  AST 0 - 37 U/L   34  32 28 31 35 32 38 (H) 25  ALT 0 - 53 U/L 67 (H) 74 (H) 41 47 (H) 50 37 38 52 (H) 47 60 (H) 37  (H): Data is abnormally high   has a past medical history of Ascending aorta dilatation (HCC), Benign essential HTN (09/09/2013), Coronary artery disease (08/2013), Dyslipidemia, Elevated fasting glucose, Fatty liver, Glaucoma, Gout, Hyperlipidemia, Hypertension, Joint pain, LAD (lymphadenopathy), Obesity, Pure hypercholesterolemia (09/09/2013), PVD (peripheral vascular disease) ([place]) (06/12/2018), and Vision abnormalities.   reports that he quit smoking about 20 months ago. His smoking use included cigars. He has never used smokeless tobacco.  Past Surgical History:  Procedure Laterality Date   ADENOIDECTOMY     CATARACT EXTRACTION     CORONARY ANGIOPLASTY WITH STENT PLACEMENT  2009   EYE SURGERY     TONSILLECTOMY     WISDOM TOOTH EXTRACTION      Allergies  Allergen Reactions   Pirfenidone Rash    Immunization History  Administered Date(s) Administered   Fluad Quad(high Dose 65+) 05/13/2020   Influenza, High Dose Seasonal PF 06/06/2019, 08/13/2021   Influenza-Unspecified 06/08/2020   PFIZER(Purple Top)SARS-COV-2 Vaccination 09/28/2019, 10/19/2019, 06/08/2020, 02/05/2021   Pneumococcal Conjugate-13 10/23/2014   Pneumococcal Polysaccharide-23 08/27/2012   Tdap 08/27/2012   Zoster, Live 08/29/2012, 09/28/2019, 03/11/2020    Family History  Problem Relation Age of Onset   Breast cancer Mother  Heart attack Father    Rheumatic fever Father    CAD Sister    Multiple sclerosis Sister    CAD Brother    Neuropathy Neg Hx      Current Outpatient Medications:    allopurinol (ZYLOPRIM) 300 MG tablet, Take 300 mg by mouth daily., Disp: ,  Rfl:    amLODipine (NORVASC) 10 MG tablet, Take 1 tablet (10 mg total) by mouth daily., Disp: 90 tablet, Rfl: 3   aspirin 81 MG tablet, Take 81 mg by mouth daily., Disp: , Rfl:    atorvastatin (LIPITOR) 80 MG tablet, Take 1 tablet (80 mg total) by mouth daily., Disp: 90 tablet, Rfl: 3   dorzolamide-timolol (COSOPT) 22.3-6.8 MG/ML ophthalmic solution, Place 1 drop into the left eye 2 (two) times daily., Disp: , Rfl:    fluticasone (FLONASE) 50 MCG/ACT nasal spray, Place 2 sprays into both nostrils as needed., Disp: , Rfl:    latanoprost (XALATAN) 0.005 % ophthalmic solution, Place 1 drop into both eyes daily., Disp: , Rfl:    losartan (COZAAR) 100 MG tablet, Take 1 tablet (100 mg total) by mouth daily., Disp: 90 tablet, Rfl: 3   metoprolol succinate (TOPROL-XL) 100 MG 24 hr tablet, Take with or immediately following a meal., Disp: 90 tablet, Rfl: 3   minocycline (MINOCIN,DYNACIN) 100 MG capsule, Take 100 mg by mouth as needed. For skin acne, Disp: , Rfl: 3   Nintedanib (OFEV) 150 MG CAPS, Take 1 capsule (150 mg total) by mouth 2 (two) times daily., Disp: 180 capsule, Rfl: 1   nitroGLYCERIN (NITROSTAT) 0.4 MG SL tablet, Place 0.4 mg under the tongue every 5 (five) minutes as needed for chest pain. , Disp: , Rfl:    pimecrolimus (ELIDEL) 1 % cream, APPLY TO AFFECTED AREA ON FACE TWICE A DAY AS NEEDED, Disp: , Rfl:    tamsulosin (FLOMAX) 0.4 MG CAPS capsule, Take 0.4 mg by mouth daily., Disp: , Rfl:  No current facility-administered medications for this visit.  Facility-Administered Medications Ordered in Other Visits:    technetium tetrofosmin (TC-MYOVIEW) injection 57.0 millicurie, 17.7 millicurie, Intravenous, Once PRN, Turner, Traci R, MD      Objective:   Vitals:   10/14/21 1400  BP: 130/72  Pulse: 63  Temp: 98 F (36.7 C)  TempSrc: Oral  SpO2: 100%  Weight: (!) 302 lb 3.2 oz (137.1 kg)  Height: _0  (1.905 m)    Estimated body mass index is 37.77 kg/m as calculated from the  following:   Height as of this encounter: _1  (1.905 m).   Weight as of this encounter: 302 lb 3.2 oz (137.1 kg).  _2 @  Filed Weights   10/14/21 1400  Weight: (!) 302 lb 3.2 oz (137.1 kg)     Physical Exam and care   General: No distress. obese Neuro: Alert and Oriented x 3. GCS 15. Speech normal Psych: Pleasant Resp:  Barrel Chest - no.  Wheeze - no, Crackles - faint at base esp right, No overt respiratory distress CVS: Normal heart sounds. Murmurs - no Ext: Stigmata of Connective Tissue Disease - no HEENT: Normal upper airway. PEERL +. No post nasal drip        Assessment:       ICD-10-CM   1. IPF (idiopathic pulmonary fibrosis) ([place])  L39.030 Pulmonary function test    Hepatic function panel    2. Encounter for therapeutic drug monitoring  Z51.81 Hepatic function panel    3. Diarrhea due to drug  K52.1  Plan:     Patient Instructions     ICD-10-CM   1. IPF (idiopathic pulmonary fibrosis) ([facility])  J84.112     2. Encounter for therapeutic drug monitoring  Z51.81     3. Diarrhea due to drug  K52.1       Clinically stable pulmonary fibrosis based on symptoms   Fluctuating between normal and slightly high liver function test  -Probably due to ethanol consumption and other medications  -Not probably due to nintedanib  -Most recent liver function test is normal  Noted significant diarrhea with nintedanib  Plan [based on shared decision making] - Stop nintedanib for 2 weeks and then start at 1 pill once daily for 1 week and then increase to 1 pill twice daily  -Take with food  -Avoid coffee and soda  -Try taking scheduled Imodium while on nintedanib  -Continue Mucinex for cough  - stop fish oil due to diarrhea  - can use paxlovid if you get covid - but we need to ensure no cross-med issues at that time  Follow-up - Check liver function tests 6-8 weeks from now -Do spirometry and DLCO in 12 weeks -Return to see Dr. Chase Caller in  12 weeks  -Symptom score at follow-up -Can discuss cough research protocol at follow-up if cough is significant   ( Level 05 visit: Estb 40-54 min   in  visit type: on-site physical face to visit  in total care time and counseling or/and coordination of care by this undersigned MD - Dr Brand Males. This includes one or more of the following on this same day 10/14/2021: pre-charting, chart review, note writing, documentation discussion of test results, diagnostic or treatment recommendations, prognosis, risks and benefits of management options, instructions, education, compliance or risk-factor reduction. It excludes time spent by the [place] or office staff in the care of the patient. Actual time 63 min)   SIGNATURE    Dr. Brand Males, M.D., F.C.C.P,  Pulmonary and Critical Care Medicine Staff Physician, [place] Director - Interstitial Lung Disease  Program  Pulmonary [facility] at [place], [place], [address]  Pager: [phone], If no answer or between  15:00h - 7:00h: call [phone] Telephone: [phone]  5:31 PM 10/14/2021

## 2021-10-14 NOTE — Patient Instructions (Addendum)
ICD-10-CM   1. IPF (idiopathic pulmonary fibrosis) ([place])  J84.112     2. Encounter for therapeutic drug monitoring  Z51.81     3. Diarrhea due to drug  K52.1       Clinically stable pulmonary fibrosis based on symptoms   Fluctuating between normal and slightly high liver function test  -Probably due to ethanol consumption and other medications  -Not probably due to nintedanib  -Most recent liver function test is normal  Noted significant diarrhea with nintedanib  Plan [based on shared decision making] - Stop nintedanib for 2 weeks and then start at 1 pill once daily for 1 week and then increase to 1 pill twice daily  -Take with food  -Avoid coffee and soda  -Try taking scheduled Imodium while on nintedanib  -Continue Mucinex for cough  - stop fish oil due to diarrhea  - can use paxlovid if you get covid - but we need to ensure no cross-med issues at that time  Follow-up - Check liver function tests 6-8 weeks from now -Do spirometry and DLCO in 12 weeks -Return to see Dr. Chase Caller in 12 weeks  -Symptom score at follow-up -Can discuss cough research protocol at follow-up if cough is significant

## 2021-11-10 ENCOUNTER — Telehealth: Payer: Self-pay | Admitting: Internal Medicine

## 2021-11-10 NOTE — Telephone Encounter (Signed)
Called Marcus [name] ? ?1) he tells me that on Friday, 11/05/2021 he completely stopped nintedanib.  He tried the holiday and rechallenge but is unable to tolerated.  Significant diarrhea.  At this point in time he is intolerant to both pirfenidone and nintedanib.  There are no other standard of care therapeutic options.  At this point in time he is going to hold off on standard of care treatment.  We will be seeing him as standard of care in April 2023 ? ?2) offered Zephyrus 095 study IV infusion versus placebo against connective tissue growth factor.  But did tell him that the study is closing on 11/12/2021.  He is currently in [facility]  He lives between [place] and [place].  Did tell him that he will have to come for infusion every 3 weeks.  Did indicate that very likely travel expenses can be covered.  Did explain that trial participation is voluntary and the primary purpose would be for drug development but because he failed standard of care therapy he could take advantage of therapeutic benefit especially because it is phase 3.  Did not mention to him he might get placebo.  Explained the known side effect profiles and relatively benign GI side effects.  He listened to all this.  He says that because he is commuting between [place] in [place] and the schedule is tied every 3 weeks even though there is a window it is not in his interest ? ?3) explain oral protocols.  Currently we have a phase 2 study but this also has potential GI side effect.  Other phase 3 studies are pending.  He does have a fair amount of cough.  Explained the cough research protocol.  Did explain that the main purpose here is quality of life.  So short-term study.  He is interested in this.  He already got the consent form from the Research officer, political party.  He is available in [facility] 11/15/2021 for the next few weeks.  He wants [place] to schedule. ? ? ? ? ?SIGNATURE  ? ? ?Dr. Brand Males, M.D.,  F.C.C.P, ACRP-CPI ?Pulmonary and Critical Care Medicine ?Company secretary, [facility] [place] ?Staff Physician, [place] ?Center Director - Interstitial Lung Disease  Program  ?Pulmonary [facility] [place] ?[place], [place], [address] ? ? ?Pager: [phone], If no answer  OR between  19:00-7:00h: page [phone] ?Telephone (research): [phone] ? ?12:12 PM ?11/10/2021 ? ? ?12:12 PM ?11/10/2021 ? ?

## 2021-11-22 DIAGNOSIS — H401113 Primary open-angle glaucoma, right eye, severe stage: Secondary | ICD-10-CM | POA: Diagnosis not present

## 2021-11-22 DIAGNOSIS — H401123 Primary open-angle glaucoma, left eye, severe stage: Secondary | ICD-10-CM | POA: Diagnosis not present

## 2021-12-18 IMAGING — CT CT CHEST HIGH RESOLUTION W/O CM
2 of 8 series · 14 of 36 positions shown, 17 images · non-contrast
Comparison: None.

CLINICAL DATA: Shortness of breath. Productive cough and shortness
of breath exertion for approximately 1 year.

EXAM:
CT CHEST WITHOUT CONTRAST
TECHNIQUE: Multidetector CT imaging of the chest was performed following the
standard protocol without intravenous contrast. High resolution
imaging of the lungs, as well as inspiratory and expiratory imaging,
was performed.

[Series 5: high resolution · axial · 0.80mm/px · z∈[-354,-68]mm · 11 of 344 slices shown, 14 images]
[im 29/344  mediastinal]
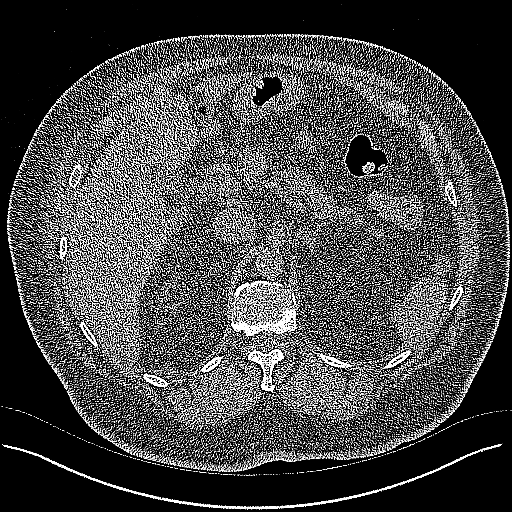
[im 29/344  lung]
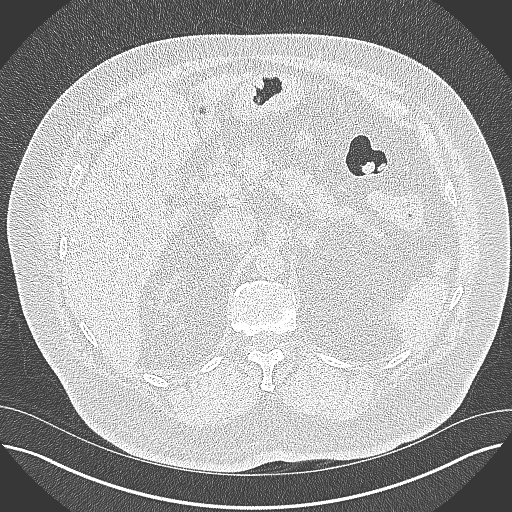
[im 58/344  lung]
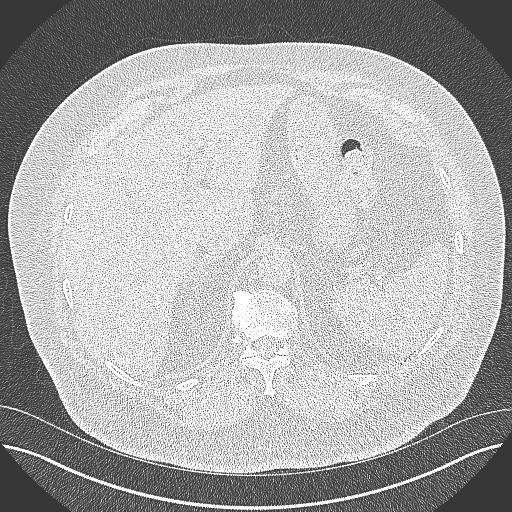
[im 86/344  lung]
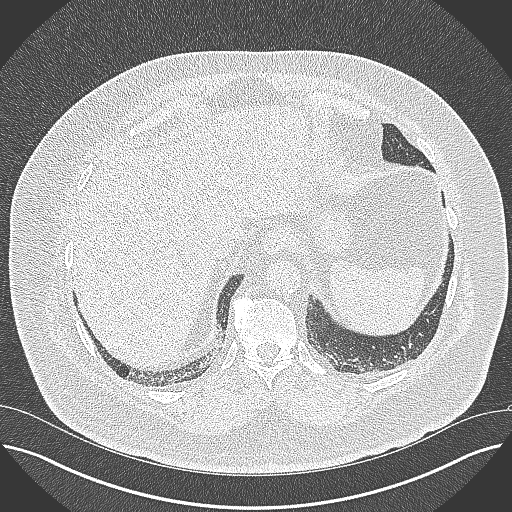
[im 115/344  lung]
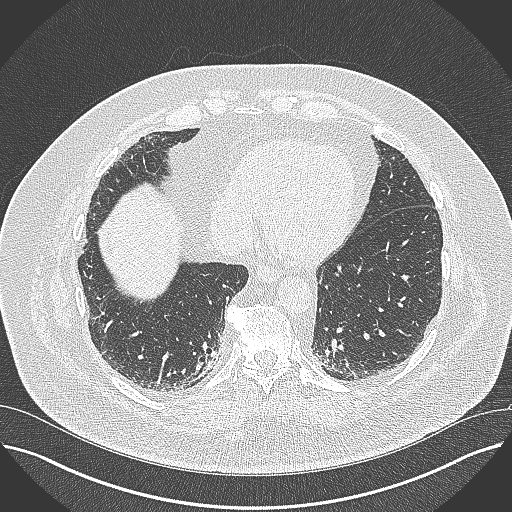
[im 143/344  mediastinal]
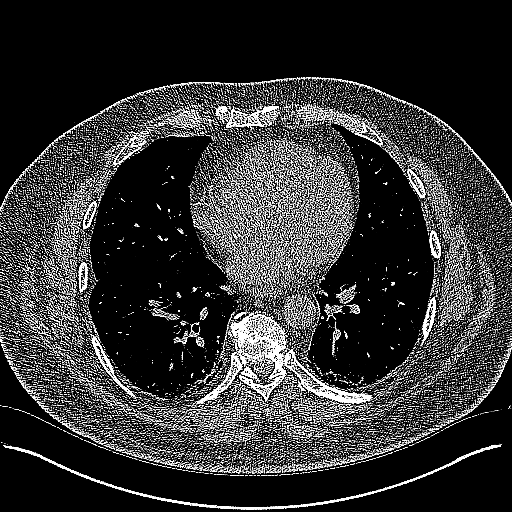
[im 143/344  lung]
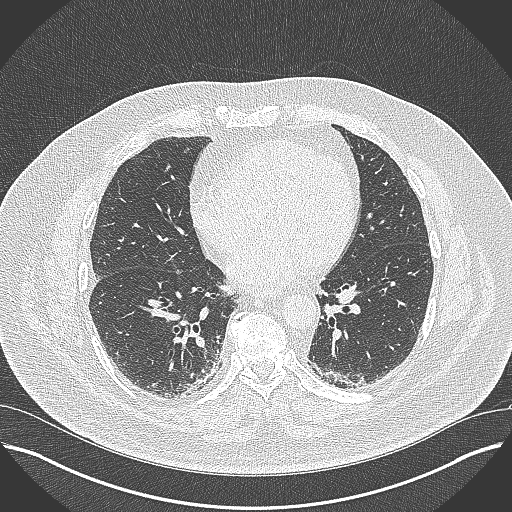
[im 172/344  lung]
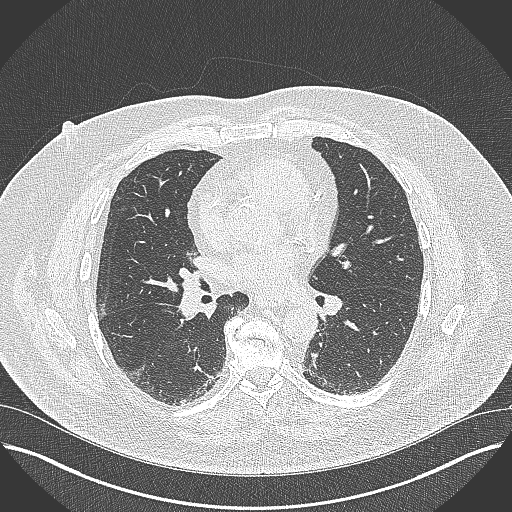
[im 201/344  lung]
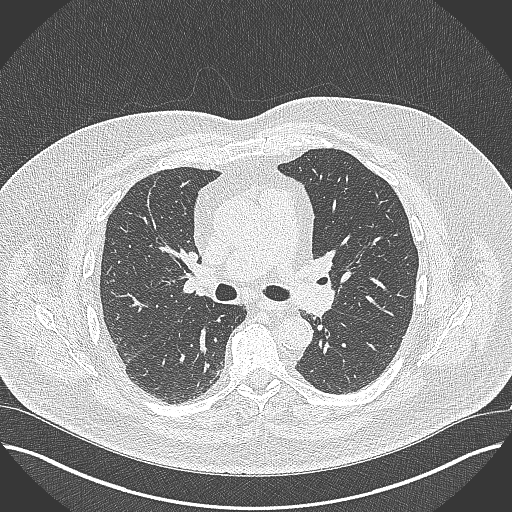
[im 229/344  lung]
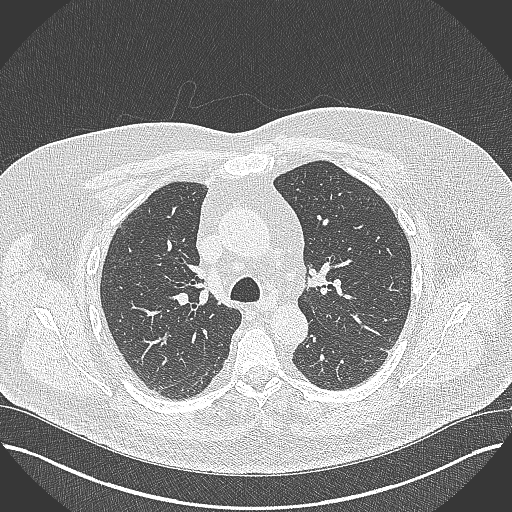
[im 258/344  mediastinal]
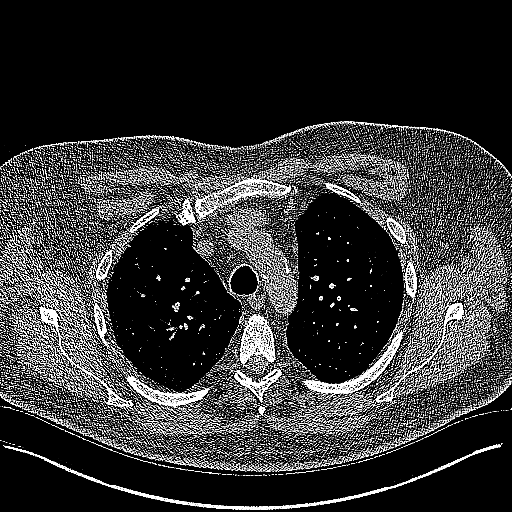
[im 258/344  lung]
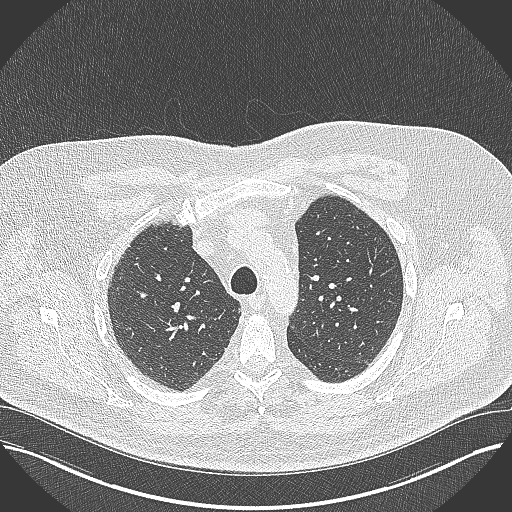
[im 286/344  lung]
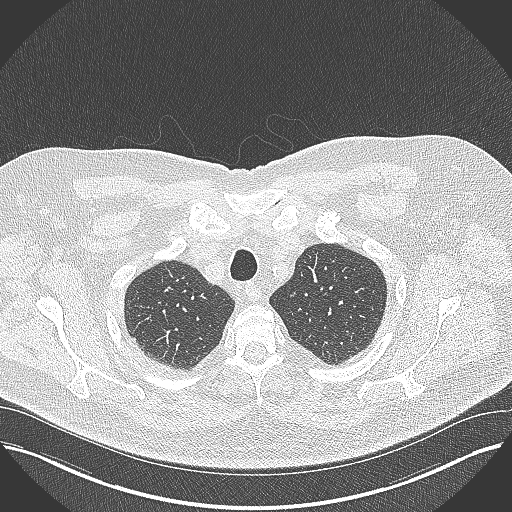
[im 315/344  lung]
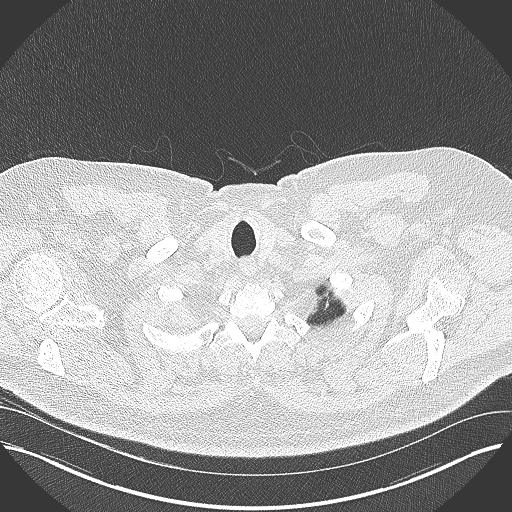

[Series 7: coronal · coronal · 0.68mm/px · 3 of 131 slices shown]
[im 27/131  lung]
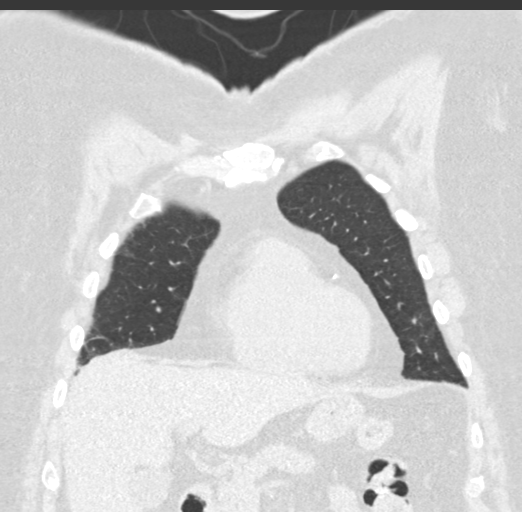
[im 53/131  lung]
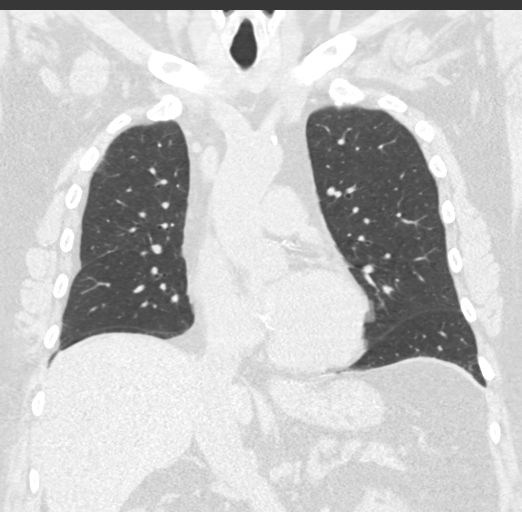
[im 79/131  lung]
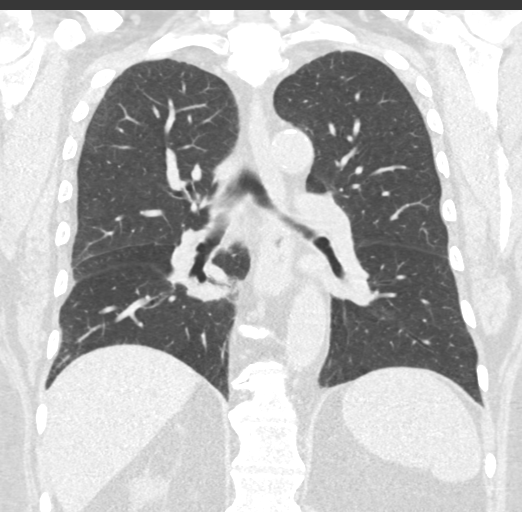

[14 of 36 positions shown; findings below may reference images not displayed]

FINDINGS: Cardiovascular: Atherosclerotic calcification of the aorta, aortic
valve and coronary arteries. Pulmonic trunk is enlarged. Heart is at
the upper limits of normal in size to mildly enlarged. No
pericardial effusion.

Mediastinum/Nodes: No pathologically enlarged mediastinal or
axillary lymph nodes. Hilar regions are difficult to evaluate
without IV contrast. Esophagus is unremarkable.

Lungs/Pleura: Mild basilar predominant subpleural reticulation,
ground-glass and traction bronchiectasis/bronchiolectasis. Findings
persist on prone imaging. No definitive honeycombing. No air
trapping. 3 mm left upper lobe nodule (3/57). 4 mm peripheral left
lower lobe nodule (3/106). Mild centrilobular emphysema. No pleural
fluid. Airway is unremarkable.

Upper Abdomen: Visualized portions of the liver, gallbladder,
adrenal glands, kidneys, spleen, pancreas, stomach and bowel are
grossly unremarkable. No upper abdominal adenopathy.

Musculoskeletal: Degenerative changes in the spine.
IMPRESSION: 1. Mild basilar predominant subpleural fibrosis may be due to
nonspecific interstitial pneumonitis or usual interstitial
pneumonitis. Findings are indeterminate for UIP per consensus
guidelines: Diagnosis of Idiopathic Pulmonary Fibrosis: An Official
ATS/ERS/JRS/ALAT Clinical Practice Guideline. Am J Respir Crit Care
Med Vol 198, Zsoca 5, ppe33-[id number][DATE].
2. Pulmonary nodules measure up to 4 mm. No follow-up needed if
patient is low-risk (and has no known or suspected primary
neoplasm). Non-contrast chest CT can be considered in 12 months if
patient is high-risk. This recommendation follows the consensus
statement: Guidelines for Management of Incidental Pulmonary Nodules
Detected on CT Images: From the [HOSPITAL] 0760; Radiology
0760; [DATE].
3. Aortic atherosclerosis ([id number].T). Coronary artery
calcification.
4. Enlarged pulmonic trunk, indicative of pulmonary arterial
hypertension.
5.  Emphysema ([id number].A).

## 2022-01-03 ENCOUNTER — Ambulatory Visit: Payer: Medicare HMO | Admitting: Internal Medicine

## 2022-01-03 ENCOUNTER — Encounter: Payer: Self-pay | Admitting: Internal Medicine

## 2022-01-03 ENCOUNTER — Ambulatory Visit (INDEPENDENT_AMBULATORY_CARE_PROVIDER_SITE_OTHER): Payer: Medicare HMO | Admitting: Internal Medicine

## 2022-01-03 VITALS — BP 128/68 | HR 58 | Temp 97.8°F | Ht 73.0 in | Wt 304.0 lb

## 2022-01-03 DIAGNOSIS — J84112 Idiopathic pulmonary fibrosis: Secondary | ICD-10-CM

## 2022-01-03 LAB — PULMONARY FUNCTION TEST
DL/VA % pred: 93 %
DL/VA: 3.6 ml/min/mmHg/L
DLCO cor % pred: 75 %
DLCO cor: 20.48 ml/min/mmHg
DLCO unc % pred: 75 %
DLCO unc: 20.48 ml/min/mmHg
FEF 25-75 Pre: 3.09 L/sec
FEF2575-%Pred-Pre: 131 %
FEV1-%Pred-Pre: 87 %
FEV1-Pre: 2.96 L
FEV1FVC-%Pred-Pre: 112 %
FEV6-%Pred-Pre: 82 %
FEV6-Pre: 3.62 L
FEV6FVC-%Pred-Pre: 105 %
FVC-%Pred-Pre: 78 %
FVC-Pre: 3.67 L
Pre FEV1/FVC ratio: 81 %
Pre FEV6/FVC Ratio: 99 %

## 2022-01-03 NOTE — Patient Instructions (Addendum)
ICD-10-CM   ?1. IPF (idiopathic pulmonary fibrosis) ([place])  J84.112   ?  ? ? ? ?Clinically stable pulmonary fibrosis based on symptoms but perhaps very mild progression on testing over time ? ?Inotlerance to ofev  and stopped ? ?Plan [based on shared decision making] ? - List ofev as allergy ?- No more anti-fibrotic ?- Hold off clinical trials for now due to schedule convenience issues ? - but take consent for TETON Study ?- talk to PCP about ozempic for weight loss ? ?Followup ?- Do spirometry and DLCO in 16 weeks ?-Return to see Dr. Chase Caller in 16 weeks ? -Symptom score at follow-up ? ?

## 2022-01-03 NOTE — Progress Notes (Signed)
? ? ? ?IOV 06/09/2020 ? ?Subjective:  ?Patient ID: Marcus [name], male , DOB: Feb 14, 1941 , age 81 y.o. , MRN: [id number] , ADDRESS: [address] ?[place] [address]  ?PCP Hulan Fess, MD ?Cardiologist Dr. Golden Hurter ? ? ?06/09/2020 -   ?Chief Complaint  ?Patient presents with  ? Consult  ?  sob for the last year, worse for the last 6 month.  ? ? ? ?HPI ?Marcus [name] 81 y.o. -81 year old male with a history of atherosclerotic coronary artery disease [status post remote PCI of LAD], hypertension and hyperlipidemia.  History of transaminitis that resolved after stopping ethanol and Tylenol.  Lower extremity Dopplers in 2017 30-49% left common femoral arterial stenosis.  He saw Dr. Radford Pax cardiology in May 2021 with new onset of shortness of breath that is progressive.  Associated with exertional chest pressure.  At baseline he goes to [place] a lot and walks 3 miles [place] time [place] the beach but as of May 2021 he was only able to walk 5 feet and had to stop halfway through that walk.  Also notices walking up stairs relieved by rest.  Overall dyspnea on exertion for 1 year.  Worse in the last 6 months but he also says that he some better in the last few months.  Denies any proximal nocturnal dyspnea orthopnea or lower extremity edema dizziness or palpitations or syncope.  Because of this he underwent nuclear medicine cardiac stress test February 14, 2020 that was low risk with ejection fraction 65%.  But he has had persistent symptoms and therefore has been referred here.  He then had pulmonary function test April 02, 2020 that shows mild restriction with FVC 3.82 L / 75% total lung capacity 5.76/71% but normal DLCO.  But because of persistent symptoms has been referred here.  Of note his echo showed grade 1 diastolic dysfunction. ? ?Of note he also has a chronic cough during the same time.  -He tells me that recently his [facility] duct died out and after that the cough improved when the [facility] duct was off.  He is now  going to get his [facility] duct evaluated. ? ? ? He also reports 10 pound weight gain during this time.  Normally is around 290 pounds.  Now he is at 300 pounds.  He is also been more sedentary because of the pandemic.  He says during this time his wife has noticed that he occasionally snores.  He does fall easily fatigued during the daytime.  He also has disrupted sleep pattern.  I did a stop bang questionnaire and it is 6 out of 8 suggesting significantly elevated risk for sleep apnea.  He has never had a sleep study.` ? ?He is going back to [place], [place] where he owns another home.  He will not be able to do test in the next few weeks. ? ? ?Results for Marcus [name] (MRN [id number]) as of 06/09/2020 14:16 ? Ref. Range 03/23/2017 09:13  ?Please Note (HCV): Unknown Comment  ?Anti Nuclear Antibody (ANA) Latest Ref Range: Negative  Negative  ?ANCA Proteinase 3 Latest Ref Range: 0.0 - 3.5 U/mL <3.5  ?Atypical pANCA Latest Ref Range: Neg:<1:20 titer <1:20  ?ENA SSA (RO) Ab Latest Ref Range: 0.0 - 0.9 AI <0.2  ?ENA SSB (LA) Ab Latest Ref Range: 0.0 - 0.9 AI <0.2  ?Deamidated Gliadin Abs, IgG Latest Ref Range: 0 - 19 units 2  ?Myeloperoxidase Ab Latest Ref Range: 0.0 - 9.0 U/mL <9.0  ?RA Latex  Turbid. Latest Ref Range: 0.0 - 13.9 IU/mL <10.0  ?Transglutaminase IgA Latest Ref Range: 0 - 3 U/mL <2  ?Cytoplasmic (C-ANCA) Latest Ref Range: Neg:<1:20 titer <1:20  ?P-ANCA Latest Ref Range: Neg:<1:20 titer <1:20  ?Antigliadin Abs, IgA Latest Ref Range: 0 - 19 units 6  ?IgG (Immunoglobin G), Serum Latest Ref Range: 700 - 1,600 mg/dL 825  ?IgM (Immunoglobulin M), Srm Latest Ref Range: 15 - 143 mg/dL 126  ?IgA/Immunoglobulin A, Serum Latest Ref Range: 61 - 437 mg/dL 312  ?Lyme IgG/IgM Ab Latest Ref Range: 0.00 - 0.90 ISR <0.91  ? ? ?NP visit Nov 2021 ? ? ?07/23/2020  - Visit  ? ?81 year old male former smoker followed in our office for dyspnea on exertion and chronic cough.  He is established with Dr. Chase Caller.  He was seen for an  initial consult in September/2021.  Plan of care from that consult with Dr. Chase Caller was as follows: Obtain high-resolution CT chest, do simple walk test, CBC with differential, IgE.  Stop bang score 6.  Was referred to Dr. Radford Pax for sleep study. ? ?Patient presenting today as follow-up.  Patient reporting that he has been scheduled for home sleep study with cardiology on 08/11/2020.  Insurance would not pay for an in lab study. ? ?Patient denies any sort of family history of lung disease.  He does report potential exposures when he was in the service for 2 years 1 of those years being in [place].  He reports a previous history of a primary care provider reporting that when he returned he may have had malaria and he was on malaria medications.  He is unsure of the validity of this.  Patient also worked in copper rod production for around 3 years.  He was around acid tanks for around 1 to 2 years during the rod washing.  He did not wear a mask.  He reports there is lots of smoke, copper dust as well as he to wear special close due to the exposures of the acid.  Patient is also currently dealing with an HVAC issue.  There is concerned there may be mold in this.  Both him and his wife have had respiratory symptoms of increased cough when the machine was running.  Is currently turned off and is being prepared to be cleaned.  He reports that the initial inspection by the HVAC team felt that there was no mold present. ? ?Patient denies any persistent issues of acid reflux.  He does have a history of GERD and has a history of hiatal hernia.  He was treated with Nexium for around 6 to 8 weeks.  He reports he has been off this medication for many years.  He does not have breakthrough reflux symptoms or require Tums. ? ?Patient continues to have aspects of shortness of breath occasionally.  Especially with physical exertion.  Patient also has an occasional cough.  Cough is more present in the morning when he is waking up.   Sometimes cough is productive.  He does have significant clear nasal drainage.  He is occasionally using a nasal medication that he believes is Flonase that his wife purchased over-the-counter. ? ? ?OV 08/25/2020 ? ? ?Subjective:  ?Patient ID: Marcus [name], male , DOB: 1940/09/13, age 65 y.o. years. , MRN: [id number],  ADDRESS: [address] ?[place] [address] ?PCP  Hulan Fess, MD ?Providers : Treatment Team:  ?Attending Provider: Brand Males, MD ?Patient Care Team: ?Hulan Fess, MD as PCP - General (Family  Medicine) ?Sueanne Margarita, MD as PCP - Cardiology (Cardiology) ? ? ? ?Chief Complaint  ?Patient presents with  ? Follow-up  ?  ILD, doing ok, occ cough with some mucous  ? ? ? ? ? ?HPI ?Marcus [name] 81 y.o. -returns for follow-up to discuss his test results.  He is here with his wife who I am meeting for the first time.  His high-resolution CT chest is interpreted as indeterminate for UIP.  I personally visualized this I personally thought it was probable UIP.  I took a second opinion from Dr. Carilyn Goodpasture thoracic radiologist who feels that patient could get classified as probable UIP on the CT scan.  Long discussion was held with the patient but he did not bring his ILD questionnaire.  He had to bring this after the visit.  Therefore parts of this dictation happened after his visit but on the same day.  His main concern is that he will die prematurely. ? ?[facility] Integrated Comprehensive ILD Questionnaire ? ?Symptoms: He tells me that he had insidious onset of shortness of breath for the last 12 months.  Since it started it is the same.  He has level 1 dyspnea for household work, shopping and walking at a level pace.  Level 2 dyspnea on walking up stairs.  He does have difficulty keeping up with others of his age.  He has neuropathy and hip soreness.  He also has a cough since March 2021.  It is the same since it started moderate in severity.  It is dry.  He does clear his throat and he  does feel a tickle in his throat.  There is no hemoptysis or sputum production.  No wheezing no nausea no vomiting no diarrhea.  The cough is mostly in the morning.  His symptoms do feel better when he sto

## 2022-01-03 NOTE — Progress Notes (Signed)
Spirometry and DLCO completed today  ?

## 2022-01-20 ENCOUNTER — Encounter: Payer: Self-pay | Admitting: Internal Medicine

## 2022-01-21 NOTE — Telephone Encounter (Signed)
[place] via PCP Hulan Fess, MD  if he meets criteria ? ?

## 2022-02-17 ENCOUNTER — Telehealth: Payer: Self-pay | Admitting: Cardiology

## 2022-02-17 DIAGNOSIS — I10 Essential (primary) hypertension: Secondary | ICD-10-CM

## 2022-02-17 NOTE — Telephone Encounter (Signed)
Durel Salts at 02/17/2022 12:27 PM  Status: Signed  Patient was returning call to Dr. Radford Pax and nurse

## 2022-02-17 NOTE — Telephone Encounter (Signed)
See other telephone note from today

## 2022-02-17 NOTE — Telephone Encounter (Signed)
Patient was returning call to Dr. Radford Pax and nurse

## 2022-02-17 NOTE — Telephone Encounter (Signed)
Pt informed of Dr. Theodosia Blender recommendation. Nurse visit scheduled for Monday. Aware office will call to schedule the 48 hour bp monitor, but aware they are behind so this may be awhile before they call. Patient verbalized understanding and agreeable to plan.

## 2022-02-17 NOTE — Telephone Encounter (Signed)
Spent about 15 min on phone with pt. Pt reports dizziness/light headedness after sitting for prolonged periods of time. According to pt this has occurred mostly since Amlodipine was added back in November, but does say sporadic instances prior to that. He reports that it doesn't happen at home, but almost always when he is away from home.  When asked about recent BPs pt states he has not been taking it daily.  He did take it this morning and it was 115/58.  He only took if for the first month/two, since November, on the new medication. Pt takes his medications (3 BP meds) at night.  Pt educated/advised going forward he should be checking his BP daily.  Also should check it when having symptoms.  Aware will forward to MD for advisement -- not sure if he needs to see [place] vs PCP first about this. Aware MD will most likely advise for an OV for further evaluation and possible orthostatic VS. Pt asking if he should stop one of his BP medications.  Advised at this time he should not b/c we do not have enough information on his BP, but await MD advisement. Patient verbalized understanding and agreeable to plan.

## 2022-02-17 NOTE — Telephone Encounter (Signed)
Left message to call back

## 2022-02-17 NOTE — Telephone Encounter (Signed)
STAT if patient feels like he/she is going to faint   Are you dizzy now? no  Do you feel faint or have you passed out? no  Do you have any other symptoms? no  Have you checked your HR and BP (record if available)? 115/58 this morning  Patient states he has been getting dizzy when sitting for an extended period of time and then standing. He states this does not happen at home, but when he is somewhere like a restaurant where he is sitting for a while. He states he does not have any other symptoms. He states at his last appt a new BP medication was added and is not sure if it needs to be adjusted.

## 2022-02-21 ENCOUNTER — Ambulatory Visit (INDEPENDENT_AMBULATORY_CARE_PROVIDER_SITE_OTHER): Payer: Medicare HMO

## 2022-02-21 VITALS — Ht 76.0 in | Wt 288.0 lb

## 2022-02-21 DIAGNOSIS — I10 Essential (primary) hypertension: Secondary | ICD-10-CM

## 2022-02-21 DIAGNOSIS — R42 Dizziness and giddiness: Secondary | ICD-10-CM | POA: Diagnosis not present

## 2022-02-21 NOTE — Progress Notes (Signed)
   Nurse Visit   Date of Encounter: 02/21/2022 ID: Marcus [name], DOB 05/16/1941, MRN [id number]  PCP:  Hulan Fess, MD   [facility] Providers Cardiologist:  Fransico Him, MD {   Visit Details   VS:  Ht _0  (1.93 m)   Wt 288 lb (130.6 kg)   BMI 35.06 kg/m  , BMI Body mass index is 35.06 kg/m.  Wt Readings from Last 3 Encounters:  02/21/22 288 lb (130.6 kg)  01/03/22 (!) 304 lb (137.9 kg)  10/14/21 (!) 302 lb 3.2 oz (137.1 kg)     Reason for visit: Orthostatic BP check Performed today: Vitals, EKG, Provider consulted:Dr. Johney Frame, and Education Changes (medications, testing, etc.) : Per Dr. Johney Frame, patient will hold his amlodipine for now and see if his symptoms improve. If his BP is elevated he can take amlodipine 5 mg (1/2 tablet) by month daily to see if this helps. Encouraged patient to call if his BP is not being controlled and if dizziness continues after holding medication. Length of Visit: 60 minutes    Medications Adjustments/Labs and Tests Ordered: No orders of the defined types were placed in this encounter.  No orders of the defined types were placed in this encounter.    Signed, Michaelyn Barter, RN  02/21/2022 4:26 PM

## 2022-02-21 NOTE — Patient Instructions (Addendum)
Medication Instructions:  Your physician has recommended you make the following change in your medication:  Please hold your amlodipine for now and if your BP gets above 140/90, please take amlodipine 5 mg (1/2 tablet). If you continue to have dizziness please give our office a call.  *If you need a refill on your cardiac medications before your next appointment, please call your pharmacy*   Lab Work: If you have labs (blood work) drawn today and your tests are completely normal, you will receive your results only by: [place] (if you have MyChart) OR A paper copy in the mail If you have any lab test that is abnormal or we need to change your treatment, we will call you to review the results.  Follow-Up: At [facility], you and your health needs are our priority.  As part of our continuing mission to provide you with exceptional heart care, we have created designated Provider Care Teams.  These Care Teams include your primary Cardiologist (physician) and Advanced Practice Providers (APPs -  Physician Assistants and Nurse Practitioners) who all work together to provide you with the care you need, when you need it.  We recommend signing up for the patient portal called "MyChart".  Sign up information is provided on this After Visit Summary.  MyChart is used to connect with patients for Virtual Visits (Telemedicine).  Patients are able to view lab/test results, encounter notes, upcoming appointments, etc.  Non-urgent messages can be sent to your provider as well.   To learn more about what you can do with MyChart, go to [email or web address].    Your next appointment:   6 month(s)  The format for your next appointment:   In Person  Provider:   Fransico Him, MD {    Important Information About [place]

## 2022-02-28 DIAGNOSIS — H401113 Primary open-angle glaucoma, right eye, severe stage: Secondary | ICD-10-CM | POA: Diagnosis not present

## 2022-02-28 DIAGNOSIS — H401123 Primary open-angle glaucoma, left eye, severe stage: Secondary | ICD-10-CM | POA: Diagnosis not present

## 2022-03-08 ENCOUNTER — Other Ambulatory Visit: Payer: Self-pay | Admitting: Cardiology

## 2022-03-08 DIAGNOSIS — I10 Essential (primary) hypertension: Secondary | ICD-10-CM

## 2022-03-08 DIAGNOSIS — R42 Dizziness and giddiness: Secondary | ICD-10-CM

## 2022-03-08 DIAGNOSIS — Z79899 Other long term (current) drug therapy: Secondary | ICD-10-CM

## 2022-03-21 DIAGNOSIS — H401113 Primary open-angle glaucoma, right eye, severe stage: Secondary | ICD-10-CM | POA: Diagnosis not present

## 2022-03-21 DIAGNOSIS — H401123 Primary open-angle glaucoma, left eye, severe stage: Secondary | ICD-10-CM | POA: Diagnosis not present

## 2022-03-22 ENCOUNTER — Ambulatory Visit (INDEPENDENT_AMBULATORY_CARE_PROVIDER_SITE_OTHER): Payer: Medicare HMO

## 2022-03-22 DIAGNOSIS — I10 Essential (primary) hypertension: Secondary | ICD-10-CM | POA: Diagnosis not present

## 2022-03-22 DIAGNOSIS — R42 Dizziness and giddiness: Secondary | ICD-10-CM

## 2022-03-22 DIAGNOSIS — Z79899 Other long term (current) drug therapy: Secondary | ICD-10-CM | POA: Diagnosis not present

## 2022-04-04 DIAGNOSIS — H1033 Unspecified acute conjunctivitis, bilateral: Secondary | ICD-10-CM | POA: Diagnosis not present

## 2022-04-07 DIAGNOSIS — H02135 Senile ectropion of left lower eyelid: Secondary | ICD-10-CM | POA: Diagnosis not present

## 2022-04-07 DIAGNOSIS — H02532 Eyelid retraction right lower eyelid: Secondary | ICD-10-CM | POA: Diagnosis not present

## 2022-04-07 DIAGNOSIS — H53483 Generalized contraction of visual field, bilateral: Secondary | ICD-10-CM | POA: Diagnosis not present

## 2022-04-07 DIAGNOSIS — H57813 Brow ptosis, bilateral: Secondary | ICD-10-CM | POA: Diagnosis not present

## 2022-04-07 DIAGNOSIS — H02535 Eyelid retraction left lower eyelid: Secondary | ICD-10-CM | POA: Diagnosis not present

## 2022-04-07 DIAGNOSIS — H02423 Myogenic ptosis of bilateral eyelids: Secondary | ICD-10-CM | POA: Diagnosis not present

## 2022-04-07 DIAGNOSIS — H0279 Other degenerative disorders of eyelid and periocular area: Secondary | ICD-10-CM | POA: Diagnosis not present

## 2022-04-07 DIAGNOSIS — H01115 Allergic dermatitis of left lower eyelid: Secondary | ICD-10-CM | POA: Diagnosis not present

## 2022-04-07 DIAGNOSIS — H16213 Exposure keratoconjunctivitis, bilateral: Secondary | ICD-10-CM | POA: Diagnosis not present

## 2022-04-07 DIAGNOSIS — H04223 Epiphora due to insufficient drainage, bilateral lacrimal glands: Secondary | ICD-10-CM | POA: Diagnosis not present

## 2022-04-07 DIAGNOSIS — H02132 Senile ectropion of right lower eyelid: Secondary | ICD-10-CM | POA: Diagnosis not present

## 2022-04-07 DIAGNOSIS — H01114 Allergic dermatitis of left upper eyelid: Secondary | ICD-10-CM | POA: Diagnosis not present

## 2022-04-26 DIAGNOSIS — H02423 Myogenic ptosis of bilateral eyelids: Secondary | ICD-10-CM | POA: Diagnosis not present

## 2022-04-28 ENCOUNTER — Ambulatory Visit: Payer: Medicare HMO | Admitting: Internal Medicine

## 2022-04-28 DIAGNOSIS — I700 Atherosclerosis of aorta: Secondary | ICD-10-CM | POA: Diagnosis not present

## 2022-04-28 DIAGNOSIS — E782 Mixed hyperlipidemia: Secondary | ICD-10-CM | POA: Diagnosis not present

## 2022-04-28 DIAGNOSIS — I77810 Thoracic aortic ectasia: Secondary | ICD-10-CM | POA: Diagnosis not present

## 2022-04-28 DIAGNOSIS — G629 Polyneuropathy, unspecified: Secondary | ICD-10-CM | POA: Diagnosis not present

## 2022-04-28 DIAGNOSIS — R7301 Impaired fasting glucose: Secondary | ICD-10-CM | POA: Diagnosis not present

## 2022-04-28 DIAGNOSIS — R220 Localized swelling, mass and lump, head: Secondary | ICD-10-CM | POA: Diagnosis not present

## 2022-04-28 DIAGNOSIS — I25119 Atherosclerotic heart disease of native coronary artery with unspecified angina pectoris: Secondary | ICD-10-CM | POA: Diagnosis not present

## 2022-04-28 DIAGNOSIS — I739 Peripheral vascular disease, unspecified: Secondary | ICD-10-CM | POA: Diagnosis not present

## 2022-04-28 DIAGNOSIS — J8410 Pulmonary fibrosis, unspecified: Secondary | ICD-10-CM | POA: Diagnosis not present

## 2022-04-28 DIAGNOSIS — I10 Essential (primary) hypertension: Secondary | ICD-10-CM | POA: Diagnosis not present

## 2022-04-28 DIAGNOSIS — N1831 Chronic kidney disease, stage 3a: Secondary | ICD-10-CM | POA: Diagnosis not present

## 2022-04-28 DIAGNOSIS — M109 Gout, unspecified: Secondary | ICD-10-CM | POA: Diagnosis not present

## 2022-05-17 ENCOUNTER — Other Ambulatory Visit: Payer: Self-pay | Admitting: Family Medicine

## 2022-05-17 DIAGNOSIS — R220 Localized swelling, mass and lump, head: Secondary | ICD-10-CM

## 2022-05-18 ENCOUNTER — Telehealth: Payer: Self-pay | Admitting: *Deleted

## 2022-05-18 NOTE — Telephone Encounter (Signed)
   Pre-operative Risk Assessment    Patient Name: Marcus [name]  DOB: [birth year] MRN: [id number]      Request for Surgical Clearance    Procedure:   B/L LOWER EYELID ECTROPION REPAIR AND RETRACTION REPAIR  Date of Surgery:  Clearance 06/01/22                                 Surgeon:  DR. TAL RUBINSTEIN Surgeon's Group or Practice Name:  [facility] Phone number:  [phone] Fax number:  [phone]   Type of Clearance Requested:   - Medical ; ASA    Type of Anesthesia:  MAC   Additional requests/questions:    [name]   05/18/2022, 5:11 PM

## 2022-05-19 ENCOUNTER — Telehealth: Payer: Self-pay | Admitting: *Deleted

## 2022-05-19 NOTE — Telephone Encounter (Signed)
Pt agreeable to plan of care for  tele pre op appt 05/24/22 @ 1:40, add on due to ASA hold time. Med rec and consent are done.   Pt states he wore a BP monitor but has not heard anything back yet with results. I assured the pt that I will send a message to Dr. Theodosia Blender nurse Bethann Berkshire to review with MD. Assured the pt that RN will call him back with results and any recommendations. Pt tells me that he is not taking the Amlodipine and has not for a few months now but still has dizziness when he gets up from a sitting position. Pt thanked me for the help today.     Patient Consent for Virtual Visit        Marcus [name] has provided verbal consent on 05/19/2022 for a virtual visit (video or telephone).   CONSENT FOR VIRTUAL VISIT FOR:  Marcus [name]  By participating in this virtual visit I agree to the following:  I hereby voluntarily request, consent and authorize [facility] and its employed or contracted physicians, physician assistants, nurse practitioners or other licensed health care professionals (the Practitioner), to provide me with telemedicine health care services (the "Services") as deemed necessary by the treating Practitioner. I acknowledge and consent to receive the Services by the Practitioner via telemedicine. I understand that the telemedicine visit will involve communicating with the Practitioner through live audiovisual communication technology and the disclosure of certain medical information by electronic transmission. I acknowledge that I have been given the opportunity to request an in-person assessment or other available alternative prior to the telemedicine visit and am voluntarily participating in the telemedicine visit.  I understand that I have the right to withhold or withdraw my consent to the use of telemedicine in the course of my care at any time, without affecting my right to future care or treatment, and that the Practitioner or I may terminate the  telemedicine visit at any time. I understand that I have the right to inspect all information obtained and/or recorded in the course of the telemedicine visit and may receive copies of available information for a reasonable fee.  I understand that some of the potential risks of receiving the Services via telemedicine include:  Delay or interruption in medical evaluation due to technological equipment failure or disruption; Information transmitted may not be sufficient (e.g. poor resolution of images) to allow for appropriate medical decision making by the Practitioner; and/or  In rare instances, security protocols could fail, causing a breach of personal health information.  Furthermore, I acknowledge that it is my responsibility to provide information about my medical history, conditions and care that is complete and accurate to the best of my ability. I acknowledge that Practitioner's advice, recommendations, and/or decision may be based on factors not within their control, such as incomplete or inaccurate data provided by me or distortions of diagnostic images or specimens that may result from electronic transmissions. I understand that the practice of medicine is not an exact science and that Practitioner makes no warranties or guarantees regarding treatment outcomes. I acknowledge that a copy of this consent can be made available to me via my patient portal ([name]), or I can request a printed copy by calling the office of [facility].    I understand that my insurance will be billed for this visit.   I have read or had this consent read to me. I understand the contents of this consent, which adequately  explains the benefits and risks of the Services being provided via telemedicine.  I have been provided ample opportunity to ask questions regarding this consent and the Services and have had my questions answered to my satisfaction. I give my informed consent for the services  to be provided through the use of telemedicine in my medical care

## 2022-05-19 NOTE — Telephone Encounter (Signed)
Pt agreeable to plan of care for  tele pre op appt 05/24/22 @ 1:40, add on due to ASA hold time. Med rec and consent are done.    Pt states he wore a BP monitor but has not heard anything back yet with results. I assured the pt that I will send a message to Dr. Theodosia Blender nurse Bethann Berkshire to review with MD. Assured the pt that RN will call him back with results and any recommendations. Pt tells me that he is not taking the Amlodipine and has not for a few months now but still has dizziness when he gets up from a sitting position. Pt thanked me for the help today.

## 2022-05-19 NOTE — Telephone Encounter (Signed)
   Name: Marcus [name]  DOB: 1941/03/12  MRN: [id number]  Primary Cardiologist: Fransico Him, MD  Chart reviewed as part of pre-operative protocol coverage. Because of Marcus [name]'s past medical history and time since last visit, he will require a follow-up telephone visit in order to better assess preoperative cardiovascular risk.  Pre-op covering staff: - Please schedule appointment and call patient to inform them. If patient already had an upcoming appointment within acceptable timeframe, please add "pre-op clearance" to the appointment notes so provider is aware. - Please contact requesting surgeon's office via preferred method (i.e, phone, fax) to inform them of need for appointment prior to surgery.  Per office protocol, he can hold his ASA x 7 days prior to the procedure. Please restart when medically safe to do so  Elgie Collard, PA-C  05/19/2022, 8:28 AM

## 2022-05-24 ENCOUNTER — Ambulatory Visit: Payer: Medicare HMO | Attending: Internal Medicine | Admitting: Nurse Practitioner

## 2022-05-24 DIAGNOSIS — Z01810 Encounter for preprocedural cardiovascular examination: Secondary | ICD-10-CM | POA: Diagnosis not present

## 2022-05-24 NOTE — Progress Notes (Signed)
Virtual Visit via Telephone Note   Because of Marcus [name]'s co-morbid illnesses, he is at least at moderate risk for complications without adequate follow up.  This format is felt to be most appropriate for this patient at this time.  The patient did not have access to video technology/had technical difficulties with video requiring transitioning to audio format only (telephone).  All issues noted in this document were discussed and addressed.  No physical exam could be performed with this format.  Please refer to the patient's chart for his consent to telehealth for Marcus [name].  Evaluation Performed:  Preoperative cardiovascular risk assessment _____________   Date:  05/24/2022   Patient ID:  Marcus [name], DOB 06-Dec-1940, MRN [id number] Patient Location:  Home Provider location:   Office  Primary Care Provider:  Hulan Fess, MD Primary Cardiologist:  Marcus Him, MD  Chief Complaint / Patient Profile   81 y.o. y/o male with a h/o CAD s/p remote PCI of LAD, normal nuclear stress test 2021, chronic HFpEF, HTN, dyslipidemia, pulmonary fibrosis, and 30 to 49% left common femoral stenosis who is pending bilateral lower tear and retraction repair and presents today for telephonic preoperative cardiovascular risk assessment.  Past Medical History    Past Medical History:  Diagnosis Date   Ascending aorta dilatation ([place])    58m gy echo 01/2020   Benign essential HTN 09/09/2013   Coronary artery disease 08/2013   S/P PCI of the LAD   Dyslipidemia    Elevated fasting glucose    Fatty liver    Glaucoma    Gout    Hyperlipidemia    Hypertension    Joint pain    LAD (lymphadenopathy)    Obesity    Pure hypercholesterolemia 09/09/2013   [place]VD [place]ipheral vascular disease) [place] 06/12/2018     30-49% left common femoral stenosis   Vision abnormalities    Past Surgical History:  Procedure Laterality Date   ADENOIDECTOMY     CATARACT EXTRACTION     CORONARY  ANGIOPLASTY WITH STENT PLACEMENT  2009   EYE SURGERY     TONSILLECTOMY     WISDOM TOOTH EXTRACTION      Allergies  Allergies  Allergen Reactions   Pirfenidone Rash   Ofev [Nintedanib] Diarrhea    History of Present Illness    BJatorian Renaultis a 81y.o. male who presents via audio/video conferencing for a telehealth visit today.  Pt was last seen in cardiology clinic on 07/27/21 by Dr. TRadford Pax  At that time BCallie Bunyardwas doing well. He contacted our office 02/17/22 with c/o dizziness. He came in for a nurse visit on 02/21/22 which revealed orthostatic hypotension. He was advised to hold amlodipine and use only 2.5 mg prn for BP > 140/90. BP monitor revealed normal BP range. The patient is now pending procedure as outlined above. Since his last visit, he denies chest pain, shortness of breath, lower extremity edema, fatigue, palpitations, melena, hematuria, hemoptysis, diaphoresis, weakness, presyncope, syncope, orthopnea, and PND. Activity is limited by pulmonary fibrosis. He can still achieve > 4 METS activity.   Home Medications    Prior to Admission medications   Medication Sig Start Date End Date Taking? Authorizing Provider  allopurinol (ZYLOPRIM) 300 MG tablet Take 300 mg by mouth daily.    [provider]  amLODipine (NORVASC) 10 MG tablet Take 1 tablet (10 mg total) by mouth daily. Patient not taking: Reported on 05/19/2022 09/14/21   TSueanne Margarita MD  aspirin  81 MG tablet Take 81 mg by mouth daily.    [provider]  atorvastatin (LIPITOR) 80 MG tablet Take 1 tablet (80 mg total) by mouth daily. 07/27/21   Sueanne Margarita, MD  dorzolamide-timolol (COSOPT) 22.3-6.8 MG/ML ophthalmic solution Place 1 drop into the left eye 2 (two) times daily. 05/02/16   [provider]  fluticasone (FLONASE) 50 MCG/ACT nasal spray Place 2 sprays into both nostrils as needed.    [provider]  latanoprost (XALATAN) 0.005 % ophthalmic solution Place 1 drop into  both eyes daily. 01/27/16   [provider]  losartan (COZAAR) 100 MG tablet Take 1 tablet (100 mg total) by mouth daily. 07/27/21   Sueanne Margarita, MD  metoprolol succinate (TOPROL-XL) 100 MG 24 hr tablet Take with or immediately following a meal. 07/27/21   Turner, Eber Hong, MD  minocycline (MINOCIN,DYNACIN) 100 MG capsule Take 100 mg by mouth as needed. For skin acne 10/07/14   [provider]  nitroGLYCERIN (NITROSTAT) 0.4 MG SL tablet Place 0.4 mg under the tongue every 5 (five) minutes as needed for chest pain.     [provider]  Nutritional Supplements (KETO PO) Take 2 each by mouth in the morning and at bedtime.    [provider]  pimecrolimus (ELIDEL) 1 % cream APPLY TO AFFECTED AREA ON FACE TWICE A DAY AS NEEDED 01/11/19   [provider]  tamsulosin (FLOMAX) 0.4 MG CAPS capsule Take 0.4 mg by mouth daily. 04/27/20   [provider]    Physical Exam    Vital Signs:  Marcus [name] does not have vital signs available for review today.  Given telephonic nature of communication, physical exam is limited. AAOx3. NAD. Normal affect.  Speech and respirations are unlabored.  Accessory Clinical Findings    None  Assessment & Plan    1.  Preoperative Cardiovascular Risk Assessment:The patient is doing well from a cardiac perspective. Therefore, based on ACC/AHA guidelines, the patient would be at acceptable risk for the planned procedure without further cardiovascular testing. The patient was advised that if he develops new symptoms prior to surgery to contact our office to arrange for a follow-up visit, and he verbalized understanding.According to the Revised Cardiac Risk Index (RCRI), his Perioperative Risk of Major Cardiac Event is (%): 0.9. His Functional Capacity in METs is: 4.3 according to the [facility] Activity Status Index (DASI). Per office protocol, he can hold his ASA x 7 days prior to the procedure. Please restart when medically  safe to do so   A copy of this note will be routed to requesting surgeon.  Time:   Today, I have spent 10 minutes with the patient with telehealth technology discussing medical history, symptoms, and management plan.     Emmaline Life, NP-C   05/24/2022, 8:06 AM [address], [address] Office [phone] Fax [phone]

## 2022-05-26 ENCOUNTER — Telehealth: Payer: Self-pay | Admitting: Internal Medicine

## 2022-05-26 NOTE — Telephone Encounter (Signed)
He has had no contact with his wife since she returned from [place].  She has been with their daughter in [place] and she is now going to be staying with their son where she will have her own bathroom and bedroom and can isolate.  I advised him that the antivirals are only prescribed for patients that test positive for Covid and fall in the window for treatment.  Advised him to call us back if he develops covid symptoms or tests positive.  He verbalized understanding.  Nothing further needed.

## 2022-05-26 NOTE — Telephone Encounter (Signed)
Wife had been in [place] with daughter and tested positive for Covid.  She has been symptoms for 2-3 days.  She has a scheduled video visit scheduled with her provider.  She is going to stay with her son.  Advised that she will need to quarantine for 5 days and until she is having no symptoms.  He has an upcoming surgery.  He is asking for a therapeutic medication.

## 2022-05-26 NOTE — Telephone Encounter (Signed)
Patient is calling to get advice from the doctor regarding his wife who just tested for positive and is on her way back home from a trip.  Patient would like to know if he needs medication and would like to know if the wife should come home or stay with relatives.  Please advise and call patient to let him know asap.  CB# [phone]

## 2022-06-09 ENCOUNTER — Ambulatory Visit
Admission: RE | Admit: 2022-06-09 | Discharge: 2022-06-09 | Disposition: A | Discharge status: Home / Self Care | Payer: Medicare HMO | Source: Ambulatory Visit | Attending: Family Medicine | Admitting: Family Medicine

## 2022-06-09 DIAGNOSIS — Z85828 Personal history of other malignant neoplasm of skin: Secondary | ICD-10-CM | POA: Diagnosis not present

## 2022-06-09 DIAGNOSIS — R220 Localized swelling, mass and lump, head: Secondary | ICD-10-CM

## 2022-06-13 DIAGNOSIS — N1832 Chronic kidney disease, stage 3b: Secondary | ICD-10-CM | POA: Diagnosis not present

## 2022-06-13 DIAGNOSIS — D649 Anemia, unspecified: Secondary | ICD-10-CM | POA: Diagnosis not present

## 2022-06-14 ENCOUNTER — Encounter: Payer: Self-pay | Admitting: Internal Medicine

## 2022-06-14 ENCOUNTER — Ambulatory Visit (INDEPENDENT_AMBULATORY_CARE_PROVIDER_SITE_OTHER): Payer: Medicare HMO | Admitting: Internal Medicine

## 2022-06-14 ENCOUNTER — Ambulatory Visit: Payer: Medicare HMO | Admitting: Internal Medicine

## 2022-06-14 VITALS — BP 122/74 | HR 56 | Temp 98.1°F | Ht 75.0 in | Wt 276.6 lb

## 2022-06-14 DIAGNOSIS — J84112 Idiopathic pulmonary fibrosis: Secondary | ICD-10-CM

## 2022-06-14 DIAGNOSIS — E669 Obesity, unspecified: Secondary | ICD-10-CM | POA: Diagnosis not present

## 2022-06-14 LAB — PULMONARY FUNCTION TEST
DL/VA % pred: 90 %
DL/VA: 3.46 ml/min/mmHg/L
DLCO cor % pred: 73 %
DLCO cor: 19.97 ml/min/mmHg
DLCO unc % pred: 73 %
DLCO unc: 19.97 ml/min/mmHg
FEF 25-75 Pre: 3.01 L/sec
FEF2575-%Pred-Pre: 130 %
FEV1-%Pred-Pre: 92 %
FEV1-Pre: 3.07 L
FEV1FVC-%Pred-Pre: 113 %
FEV6-%Pred-Pre: 85 %
FEV6-Pre: 3.74 L
FEV6FVC-%Pred-Pre: 105 %
FVC-%Pred-Pre: 81 %
FVC-Pre: 3.78 L
Pre FEV1/FVC ratio: 81 %
Pre FEV6/FVC Ratio: 99 %

## 2022-06-14 NOTE — Patient Instructions (Signed)
Spirometry and DLCO Performed Today.  

## 2022-06-14 NOTE — Progress Notes (Signed)
IOV 06/09/2020  Subjective:  Patient ID: Marcus [name], male , DOB: March 15, 1941 , age 81 y.o. , MRN: [id number] , ADDRESS: [address] [place] [address]  PCP Marcus Fess, MD Cardiologist Dr. Golden [name]   06/09/2020 -   Chief Complaint  Patient presents with   Consult    sob for the last year, worse for the last 6 month.     HPI Marcus [name] 81 y.o. -81 year old male with a history of atherosclerotic coronary artery disease [status post remote PCI of LAD], hypertension and hyperlipidemia.  History of transaminitis that resolved after stopping ethanol and Tylenol.  Lower extremity Dopplers in 2017 30-49% left common femoral arterial stenosis.  He saw Dr. Radford [name] cardiology in May 2021 with new onset of shortness of breath that is progressive.  Associated with exertional chest pressure.  At baseline he goes to [place] a lot and walks 3 miles [place] the beach but as of May 2021 he was only able to walk 5 feet and had to stop halfway through that walk.  Also notices walking up stairs relieved by rest.  Overall dyspnea on exertion for 1 year.  Worse in the last 6 months but he also says that he some better in the last few months.  Denies any proximal nocturnal dyspnea orthopnea or lower extremity edema dizziness or palpitations or syncope.  Because of this he underwent nuclear medicine cardiac stress test February 14, 2020 that was low risk with ejection fraction 65%.  But he has had persistent symptoms and therefore has been referred here.  He then had pulmonary function test April 02, 2020 that shows mild restriction with FVC 3.82 L / 75% total lung capacity 5.76/71% but normal DLCO.  But because of persistent symptoms has been referred here.  Of note his echo showed grade 1 diastolic dysfunction.  Of note he also has a chronic cough during the same time.  -He tells me that recently his [facility] duct died out and after that the cough improved when the [facility] duct was off.  He is  now going to get his [facility] duct evaluated.    He also reports 10 pound weight gain during this time.  Normally is around 290 pounds.  Now he is at 300 pounds.  He is also been more sedentary because of the pandemic.  He says during this time his wife has noticed that he occasionally snores.  He does fall easily fatigued during the daytime.  He also has disrupted sleep pattern.  I did a stop bang questionnaire and it is 6 out of 8 suggesting significantly elevated risk for sleep apnea.  He has never had a sleep study.`  He is going back to [place], [place] where he owns another home.  He will not be able to do test in the next few weeks.   Results for Marcus [name] (MRN [id number]) as of 06/09/2020 14:16  Ref. Range 03/23/2017 09:13  Please Note (HCV): Unknown Comment  Anti Nuclear Antibody (ANA) Latest Ref Range: Negative  Negative  ANCA Proteinase 3 Latest Ref Range: 0.0 - 3.5 U/mL <3.5  Atypical pANCA Latest Ref Range: Neg:<1:20 titer <1:20  ENA SSA (RO) Ab Latest Ref Range: 0.0 - 0.9 AI <0.2  ENA SSB (LA) Ab Latest Ref Range: 0.0 - 0.9 AI <0.2  Deamidated Gliadin Abs, IgG Latest Ref Range: 0 - 19 units 2  Myeloperoxidase Ab Latest Ref Range: 0.0 - 9.0 U/mL <9.0  RA Latex Turbid. Latest Ref Range: 0.0 - 13.9 IU/mL <10.0  Transglutaminase IgA Latest Ref Range: 0 - 3 U/mL <2  Cytoplasmic (C-ANCA) Latest Ref Range: Neg:<1:20 titer <1:20  P-ANCA Latest Ref Range: Neg:<1:20 titer <1:20  Antigliadin Abs, IgA Latest Ref Range: 0 - 19 units 6  IgG (Immunoglobin G), Serum Latest Ref Range: 700 - 1,600 mg/dL 825  IgM (Immunoglobulin M), Srm Latest Ref Range: 15 - 143 mg/dL 126  IgA/Immunoglobulin A, Serum Latest Ref Range: 61 - 437 mg/dL 312  Lyme IgG/IgM Ab Latest Ref Range: 0.00 - 0.90 ISR <0.91    NP visit Nov 2021   07/23/2020  - Visit   81 year old male former smoker followed in our office for dyspnea on exertion and chronic cough.  He is established with Dr. Chase [name].  He was seen for an  initial consult in September/2021.  Plan of care from that consult with Dr. Chase [name] was as follows: Obtain high-resolution CT chest, do simple walk test, CBC with differential, IgE.  Stop bang score 6.  Was referred to Dr. Radford [name] for sleep study.  Patient presenting today as follow-up.  Patient reporting that he has been scheduled for home sleep study with cardiology on 08/11/2020.  Insurance would not pay for an in lab study.  Patient denies any sort of family history of lung disease.  He does report potential exposures when he was in the service for 2 years 1 of those years being in [place].  He reports a previous history of a primary care provider reporting that when he returned he may have had malaria and he was on malaria medications.  He is unsure of the validity of this.  Patient also worked in copper rod production for around 3 years.  He was around acid tanks for around 1 to 2 years during the rod washing.  He did not wear a mask.  He reports there is lots of smoke, copper dust as well as he to wear special close due to the exposures of the acid.  Patient is also currently dealing with an HVAC issue.  There is concerned there may be mold in this.  Both him and his wife have had respiratory symptoms of increased cough when the machine was running.  Is currently turned off and is being prepared to be cleaned.  He reports that the initial inspection by the HVAC team felt that there was no mold present.  Patient denies any persistent issues of acid reflux.  He does have a history of GERD and has a history of hiatal hernia.  He was treated with Nexium for around 6 to 8 weeks.  He reports he has been off this medication for many years.  He does not have breakthrough reflux symptoms or require Tums.  Patient continues to have aspects of shortness of breath occasionally.  Especially with physical exertion.  Patient also has an occasional cough.  Cough is more present in the morning when he is waking up.   Sometimes cough is productive.  He does have significant clear nasal drainage.  He is occasionally using a nasal medication that he believes is Flonase that his wife purchased over-the-counter.   OV 08/25/2020   Subjective:  Patient ID: [name], male , DOB: May 16, 1941, age 19 y.o. years. , MRN: [id number],  ADDRESS: [address] [place] [address] PCP  [place] Fess, MD Providers : Treatment Team:  Attending Provider: Brand Males, MD Patient Care Team: [place] Fess, MD as PCP -  General (Family Medicine) Sueanne Margarita, MD as PCP - Cardiology (Cardiology)    Chief Complaint  Patient presents with   Follow-up    ILD, doing ok, occ cough with some mucous       HPI Marcus [name] 81 y.o. -returns for follow-up to discuss his test results.  He is here with his wife who I am meeting for the first time.  His high-resolution CT chest is interpreted as indeterminate for UIP.  I personally visualized this I personally thought it was probable UIP.  I took a second opinion from Dr. Carilyn Goodpasture thoracic radiologist who feels that patient could get classified as probable UIP on the CT scan.  Long discussion was held with the patient but he did not bring his ILD questionnaire.  He had to bring this after the visit.  Therefore parts of this dictation happened after his visit but on the same day.  His main concern is that he will die prematurely.  [place] Integrated Comprehensive ILD Questionnaire  Symptoms: He tells me that he had insidious onset of shortness of breath for the last 12 months.  Since it started it is the same.  He has level 1 dyspnea for household work, shopping and walking at a level pace.  Level 2 dyspnea on walking up stairs.  He does have difficulty keeping up with others of his age.  He has neuropathy and hip soreness.  He also has a cough since March 2021.  It is the same since it started moderate in severity.  It is dry.  He does clear his throat and he  does feel a tickle in his throat.  There is no hemoptysis or sputum production.  No wheezing no nausea no vomiting no diarrhea.  The cough is mostly in the morning.  His symptoms do feel better when he stops activity for 5 minutes.  It makes him feel more short of breath when he does vigorous activity.  No associated symptoms.   Past Medical History : He has a history of coronary artery disease with 1 stent. He is undergoing a sleep apnea work-up.  He has chronic kidney disease stage III according to his history. Malaria in [place]. Polio as a kid   He does not have rheumatoid arthritis.  No scleroderma no lupus no polymyositis no Sjogren's.  No other vasculitis.  No thyroid disease no diabetes.  No stroke no seizures.  No hepatitis no pneumonia.  His creatinine in May 2021 was 1.58 mg percent with a GFR of 42-48.  In October 2019 his creatinine was 1.35 mg percent.   Of note he says his dental hygienist has noted persistent redness in his hard palate.  He is asking me to refer him to ENT specialist and I have obliged  ROS: He does have arthralgia related to gout.  He has seborrhea.  He denies any oral ulcers or heartburn or vomiting or nausea.  Denies any weight loss.  He has obesity.  No recurrent fever no dryness.  No fatigue   FAMILY HISTORY of LUNG DISEASE: No pulmonary fibrosis no COPD.  His sister has autoimmune disease Brother has asthma   EXPOSURE HISTORY: He smokes cigarettes between 1960 and 1966 2 packs.  He also smoked cigars for 20 years.  No passive smoking.  No electronic cigarettes no marijuana no vaping no cocaine no intravenous drug use. Was      HOME and HOBBY DETAILS : Single-family home [place] for the last  10 years.  Age of the home is 25 years.  It is a 5 bedroom house.  There is no dampness.  No mold or mildew no humidifier use no CPAP use no nebulizer use.  No steam iron use no Jacuzzi use no misting Fountain.  No pet birds or parakeets no pet  gerbils or hamsters.  No feather pillows no mold in the [facility] duct.  No music habits.  No gardening habits.  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  During this time he was exposed to gas fumes and chemicals he reports that sulfuric acid copper out washing.  Otherwise negative for organic and inorganic antigens.   PULMONARY TOXICITY HISTORY (27 items): Negative     Radiology below were read by Dr. Lorin Picket is indeterminate for UIP.  My personal opinion this is probable UIP.  Dr. Carilyn Goodpasture radiologist can also concur with me it is probable UIP   Narrative & Impression  CLINICAL DATA:  Shortness of breath. Productive cough and shortness of breath exertion for approximately 1 year.   EXAM: CT CHEST WITHOUT CONTRAST   TECHNIQUE: Multidetector CT imaging of the chest was performed following the standard protocol without intravenous contrast. High resolution imaging of the lungs, as well as inspiratory and expiratory imaging, was performed.   COMPARISON:  None.   FINDINGS: Cardiovascular: Atherosclerotic calcification of the aorta, aortic valve and coronary arteries. Pulmonic trunk is enlarged. Heart is at the upper limits of normal in size to mildly enlarged. No pericardial effusion.   Mediastinum/Nodes: No pathologically enlarged mediastinal or axillary lymph nodes. Hilar regions are difficult to evaluate without IV contrast. Esophagus is unremarkable.   Lungs/Pleura: Mild basilar predominant subpleural reticulation, ground-glass and traction bronchiectasis/bronchiolectasis. Findings persist on prone imaging. No definitive honeycombing. No air trapping. 3 mm left upper lobe nodule (3/57). 4 mm peripheral left lower lobe nodule (3/106). Mild centrilobular emphysema. No pleural fluid. Airway is unremarkable.   Upper Abdomen: Visualized portions of the liver,  gallbladder, adrenal glands, kidneys, spleen, pancreas, stomach and bowel are grossly unremarkable. No upper abdominal adenopathy.   Musculoskeletal: Degenerative changes in the spine.   IMPRESSION: 1. Mild basilar predominant subpleural fibrosis may be due to nonspecific interstitial pneumonitis or usual interstitial pneumonitis. Findings are indeterminate for UIP per consensus guidelines: Diagnosis of Idiopathic Pulmonary Fibrosis: An Official ATS/ERS/JRS/ALAT Clinical Practice Guideline. [place], Iss 5, [id number], May 13 2017. 2. Pulmonary nodules measure up to 4 mm. No follow-up needed if patient is low-risk (and has no known or suspected primary neoplasm). Non-contrast chest CT can be considered in 12 months if patient is high-risk. This recommendation follows the consensus statement: Guidelines for Management of Incidental Pulmonary Nodules Detected on CT Images: From the Fleischner Society 2017; Radiology 2017; 284:228-243. 3. Aortic atherosclerosis (ICD10-I70.0). Coronary artery calcification. 4. Enlarged pulmonic trunk, indicative of pulmonary arterial hypertension. 5.  Emphysema (ICD10-J43.9).     Electronically Signed   By: Lorin Picket M.D.   On: 07/15/2020 14:33    Results for [name], [name] (MRN [id number]) as of 08/25/2020 10:28  Ref. Range 07/23/2020 09:56 07/23/2020 09:58  Anti Nuclear Antibody (ANA) Latest Ref Range: NEGATIVE  POSITIVE (A)   ANA Pattern 1 Unknown Nuclear, Homogeneous (A)   ANA Titer 1 Latest Units: titer 4:00 (H)   Cyclic Citrullin Peptide Ab Latest Units: UNITS <  16   RA Latex Turbid. Latest Ref Range: <14 IU/mL <14   ANA,IFA RA DIAG PNL W/RFLX TIT/PATN Unknown Rpt (A)   Anti-Jo-1 Ab (RDL) Latest Ref Range: <20 Units  <20  Anti-PL-7 Ab (RDL) Latest Ref Range: Negative   Negative  Anti-PL-12 Ab (RDL) Latest Ref Range: Negative   Negative  Anti-EJ Ab (RDL) Latest Ref Range: Negative   Negative  Anti-OJ Ab (RDL)  Latest Ref Range: Negative   Negative  Anti-SRP Ab (RDL) Latest Ref Range: Negative   Negative  Anti-Mi-2 Ab (RDL) Latest Ref Range: Negative   Negative  Anti-TIF-1gamma Ab (RDL) Latest Ref Range: <20 Units  <20  Anti-MDA-5 Ab (CADM-140)(RDL) Latest Ref Range: <20 Units  <20  Anti-NXP-2 (P140) Ab (RDL) Latest Ref Range: <20 Units  <20  Anti-SAE1 Ab, IgG (RDL) Latest Ref Range: <20 Units  <20  Anti-PM/Scl-100 Ab (RDL) Latest Ref Range: <20 Units  <20  Anti-Ku Ab (RDL) Latest Ref Range: Negative   Negative  Anti-SS-A 52kD Ab, IgG (RDL) Latest Ref Range: <20 Units  <20  Anti-U1 RNP Ab (RDL) Latest Ref Range: <20 Units  <20  Anti-U2 RNP Ab (RDL) Latest Ref Range: Negative   Negative  Anti-U3 RNP (Fibrillarin)(RDL) Latest Ref Range: Negative   Negative  SSA (Ro) (ENA) Antibody, IgG Latest Ref Range: <1.0 NEG AI <1.0 NEG   SSB (La) (ENA) Antibody, IgG Latest Ref Range: <1.0 NEG AI <1.0 NEG   Scleroderma (Scl-70) (ENA) Antibody, IgG Latest Ref Range: <1.0 NEG AI <1.0 NEG     OV 10/28/2020  Subjective:  Patient ID: [name], male , DOB: December 29, 1940 , age 54 y.o. , MRN: [id number] , ADDRESS: [address] [place] [address] PCP [place] Fess, MD Patient Care Team: [place] Fess, MD as PCP - General (Family Medicine) Sueanne Margarita, MD as PCP - Cardiology (Cardiology)  This Provider for this visit: Treatment Team:  Attending Provider: Brand Males, MD    10/28/2020 -   Chief Complaint  Patient presents with   Follow-up    Get pft results.  Gets bloody nose when he blows his nose, small amount of blood, just since starting Esbriet.    IPF dx 08/25/2020: High PRob  clinical suspicion is you have IPF based on age greater than 31, male gender, Caucasian ethnicity, negative serology profile, history of acid reflux, previous exposure to smoke and copper and prior history of smoking and prob UIP on CT.  Start esbriet Januaryc 08, 2022 HPI [name] 81 y.o. -follow-up  idiopathic pulmonary fibrosis new diagnosis December 2021. We started him on pirfenidone January 2022. He says he is tolerating it really well. His symptom score is stable. In fact he feels that pirfenidone is helping him. He feels his lungs have opened up. He feels that the antifibrotic actions have kicked in. He does admit this could be psychological. He did complain of some bloody nose when he blows up. He wondered if it is because of pirfenidone. Explained to him pirfenidone does not have any antiplatelet agents. Explained that likely this is due to the cold winter and dry air. He shuttles between [place] and [place]. Did explain to him that if he were to relocate to [place] I would set him up with excellent ILD specialist at [facility]. We discussed patient support group. He seems interested. We discussed pulmonary rehabilitation but he has back and spine issues and knee issues that make it difficult. He wants to reflect on this. We discussed clinical trials  as a care option including specifically all the details below. He wants to reflect on this but he feels that shuttling between [place] in [place] can be difficult to do clinical trials. However once is stable on the drug and sometimes past he might be able to do it.  There are no other new issues. Last visit I did indicate to him to consider stopping his omega-3 because of concern of omega-3 making acid reflux worse which is a risk factor for IPF. However he says it has helped his triglycerides. He wants to continue on this. His acid reflux is not super active. I have supported him in this decision.     PFT    OV 12/08/2020  Subjective:  Patient ID: [name], male , DOB: 1941-08-23 , age 27 y.o. , MRN: [id number] , ADDRESS: [address] [place] [address] PCP Marcus Fess, MD Patient Care Team: [place] Fess, MD as PCP - General (Family Medicine) Sueanne Margarita, MD as PCP - Cardiology (Cardiology)  This  Provider for this visit: Treatment Team:  Attending Provider: Brand Males, MD   IPF dx 08/25/2020 (reconfirmed MDD 11/17/20) : High PRob  clinical suspicion is you have IPF based on age greater than 52, male gender, Caucasian ethnicity, negative serology profile, history of acid reflux, previous exposure to smoke and copper and prior history of smoking and prob UIP on CT.   Start esbriet Jory Sims 08, 2022   12/08/2020 -   Chief Complaint  Patient presents with   Follow-up    Pt states he is doing okay since last visit and states breathing is about the same.     HPI Marcus [name] 81 y.o. -+ presents for follow-up.  Last visit was 1 month ago.  This visit was supposed to be a televisit but he showed up at the front door.  Therefore he preferred to do a face-to-face visit.  We discussed in the clinical conference and confirmation is that he is IPF.  He says he is tolerating pirfenidone well.  He is going to start his third month of pirfenidone in the first week of April 2022.  He says occasionally bites his tongue at night.  This is associated with starting losartan 6 months ago but is not on ACE inhibitor.  He will discuss this with the cardiologist.  Also says that tongue biting started after the pirfenidone but it is not a known side effect of pirfenidone.  Overall he is feeling stable.  He is not interested in clinical trials of pulmonary rehabilitation at this point.  He shuttling between [place] in [place].  His symptom scores are listed below.    Telel visit 01/19/21  81 yo former smoker followed for IPF Medical history significant for coronary artery disease, hypertension and hyperlipidemia.  Today's telemedicine visit is for a 6-week follow-up.  Patient has presumed IPF with interstitial changes consistent with UIP on high-resolution CT chest.  Patient was started on Esbriet in January 2022.  Patient says he is tolerating well.  He has had no nausea vomiting or diarrhea.   Appetite is good no weight loss.  Patient says he still gets short of breath with activities but tries to remain active.  He has minimum cough. Patient aware that he needs to return for lab work today.  Patient denies any flare of cough or shortness of breath.  No change in his activity level.    OV 05/28/2021  Subjective:  Patient ID: [name], male , DOB:  1940-10-03 , age 23 y.o. , MRN: [id number] , ADDRESS: [address] [place] [address] PCP Marcus Fess, MD Patient Care Team: [place] Fess, MD as PCP - General (Family Medicine) Sueanne Margarita, MD as PCP - Cardiology (Cardiology)  This Provider for this visit: Treatment Team:  Attending Provider: Brand Males, MD    05/28/2021 -   Chief Complaint  Patient presents with   Follow-up    Pt states he has been doing okay since last visit. States that he stopped taking Esbriet about a month ago. Pt states that his breathing is about the same.    IPF dx 08/25/2020 (reconfirmed MDD 11/17/20) : High PRob  clinical suspicion is you have IPF based on age greater than 2, male gender, Caucasian ethnicity, negative serology profile, history of acid reflux, previous exposure to smoke and copper and prior history of smoking and prob UIP on CT.   Start esbriet Januaryc 08, 2022 - stopped summer 2022 afte sun rash x 2 times   Starting ofev  HPI Marcus [name] 81 y.o. -       OV 08/18/2021  Subjective:  Patient ID: [place] [name], male , DOB: 12/27/1940 , age 49 y.o. , MRN: [id number] , ADDRESS: [address] [place] [address] PCP Marcus Fess, MD Patient Care Team: Marcus Fess, MD as PCP - General (Family Medicine) Sueanne Margarita, MD as PCP - Cardiology (Cardiology)  This Provider for this visit: Treatment Team:  Attending Provider: Brand Males, MD    08/18/2021 -   Chief Complaint  Patient presents with   Follow-up    F/U after starting Ofev. States his BP has increased since  starting Ofev on November 1st. Has had occasional episodes of diarrhea.     IPF dx 08/25/2020 (reconfirmed MDD 11/17/20) : High PRob  clinical suspicion is you have IPF based on age greater than 74, male gender, Caucasian ethnicity, negative serology profile, history of acid reflux, previous exposure to smoke and copper and prior history of smoking and prob UIP on CT.   Start esbriet Januaryc 08, 2022 - stopped summer 2022 afte sun rash x 2 times   Starting ofev  HPI Marcus [name] 81 y.o. -returns for follow-up.  In the interim because he failed pirfenidone we switched him to nintedanib.  He is tolerating it well except for some mild diarrhea but 2 weeks after starting this his blood pressure was high the cardiology visit.  I reviewed the notes.  He started nintedanib around late October 2022/early November 2022 and by mid November there was increase in blood pressure [the third patient I am seeing with this issue this year].  Cardiologist Dr. Radford [name] has added amlodipine 5 mg/day to the regimen with is not controlling the blood pressure.  I did advise him that he could contact and increase it to 10 mg/day with a side effect being edema.  He is going to look into this.  His respiratory status is stable.  He had liver function test monitoring and he has a new increase in ALT.  He is on allopurinol and Lipitor which can also increase his liver enzymes but he has been on this for a long time.  Only new intervention is nintedanib.  Very likely and nintedanib is increasing the liver enzymes but is only a mild enzyme increase in 1 enzyme which is ALT.  He is agreed for close monitoring.  I did not ask him about his alcohol intake.  He has not had  PFTs.  We are holding off on PFTs because of his recent eye surgery which has gone well but the recovery is slow but improving.   He had high-resolution CT scan of the chest recently in November 2022.  Compared to 1 year ago there is no progression.  There are no  other new findings.  And CT scan of the chest in 1 year.  Recent Labs  Lab 08/17/21 0916  AST 35  ALT 52*  ALKPHOS 60  BILITOT 0.5  PROT 6.5  ALBUMIN 4.3      CT Chest data 07/27/21  CLINICAL DATA:  81 year old male with history of interstitial lung disease. Follow-up study.   EXAM: CT CHEST WITHOUT CONTRAST   TECHNIQUE: Multidetector CT imaging of the chest was performed following the standard protocol without intravenous contrast. High resolution imaging of the lungs, as well as inspiratory and expiratory imaging, was performed.   COMPARISON:  Chest CT 07/15/2020.   FINDINGS: Cardiovascular: Heart size is normal. There is no significant pericardial fluid, thickening or pericardial calcification. There is aortic atherosclerosis, as well as atherosclerosis of the great vessels of the mediastinum and the coronary arteries, including calcified atherosclerotic plaque in the left main, left anterior descending, left circumflex and right coronary arteries.   Mediastinum/Nodes: No pathologically enlarged mediastinal or hilar lymph nodes. Please note that accurate exclusion of hilar adenopathy is limited on noncontrast CT scans. Esophagus is unremarkable in appearance. No axillary lymphadenopathy.   Lungs/Pleura: High-resolution images demonstrate areas of ground-glass attenuation, septal thickening, mild subpleural reticulation, mild cylindrical bronchiectasis, thickening of the peribronchovascular interstitium and regional architectural distortion and volume loss. These findings are most evident in the lung bases, particularly in the lower lobes. Bronchiectasis is now more apparent in these regions, but no other significant progression of disease when compared to the prior study. No honeycombing. Inspiratory and expiratory imaging is unremarkable.   Upper Abdomen: Aortic atherosclerosis.   Musculoskeletal: There are no aggressive appearing lytic or blastic lesions  noted in the visualized portions of the skeleton.   IMPRESSION: 1. The appearance of the lungs remains compatible with interstitial lung disease, with a spectrum of findings categorized as probable usual interstitial pneumonia (UIP) per current ATS guidelines. No significant progression of disease compared to the prior study. 2. Aortic atherosclerosis, in addition to left main and 3 vessel coronary artery disease. Please note that although the presence of coronary artery calcium documents the presence of coronary artery disease, the severity of this disease and any potential stenosis cannot be assessed on this non-gated CT examination. Assessment for potential risk factor modification, dietary therapy or pharmacologic therapy may be warranted, if clinically indicated.   Aortic Atherosclerosis (ICD10-I70.0).     Electronically Signed   By: Vinnie Langton M.D.   On: 07/28/2021 10:58    No results found.    09/10/2021 Patient was contacted today for virtual follow-up/ Clinically stable pulmonary fibrosis. He has been on OFEV for 2 months. He was last seen by MR on 08/18/21. He had some mild diarrhea arou. BP elevated with cardiology, Dr. Radford [name] added Amlodipine 66m daily. Patient reports that his blood pressure reading this morning was 140/62 compared to previous readings in 150-160/70s. He is still having diarrhea.  He will go 2-3 days without any GI symptoms. When diarrhea is severe he will take imodium which does help. On average he takes 6 imodium tablets a week. ALT on CMET was elevated on 08/17/21. He has been on Allopurional and lipitor for a long  time. He did a repeat lab test which showed LFTs which were back to normal. He need another hepatic functoin panel in 1 week. He consumes 2-3 glasses of wine a day and 2-3 pints of beer a week.       OV 10/14/2021  Subjective:  Patient ID: Marcus [name], male , DOB: 13-Sep-1940 , age 50 y.o. , MRN: [id number] , ADDRESS: [address] [place] [address] PCP Marcus Fess, MD Patient Care Team: Marcus Fess, MD as PCP - General (Family Medicine) Sueanne Margarita, MD as PCP - Cardiology (Cardiology)  This Provider for this visit: Treatment Team:  Attending Provider: Brand Males, MD    10/14/2021 -   IPF follow-up on nintedanib.  Previous intolerance to pirfenidone. Chief Complaint  Patient presents with   Follow-up    Pt states he is about the same since last visit.     HPI Marcus [name] 81 y.o. -returns for follow-up.  He has intermittent mild transaminitis while on nintedanib.  This is no attributable to alcohol and also Lipitor.  Most recent liver function test is normal.  However he is having significant diarrhea.  He uses 6-7 Imodium each week.  He wants to stop nintedanib and give it a holiday for a few weeks and then reassess.  He did admit to drinking 2 cups of coffee a day and also 1 soda a week.  Beyond this he denies any other increased sugar intake or carbohydrate intake.  He does have high triglycerides and is on fish oil.  I recommended he stop fish oil and avoid coffee to help with his diarrhea.  He says he will stop the fish oil at least temporarily although is worried about the impact on his triglycerides.  I did indicate to him the best treatment for hypertriglyceridemia is low carbohydrate food.  In any event he wants to give nintedanib holiday.  We agreed to do a 2-week holiday.  He from a respiratory standpoint is stable.  His walking desaturation test is stable.  We are holding off on pulmonary function test at the moment because of recent cataract surgery.  He does have significant cough and is on Mucinex.  He says he has had 4 shots of COVID-vaccine.  Is wondering whether he should get another booster.  I indicated to him the current active strain is from [place] the [place] strain.  There is no mRNA vaccine for this.  Therefore we took a shared decision making to hold off.  However if he got  COVID we will do antiviral.    .    OV 01/03/2022  Subjective:  Patient ID: Marcus [name], male , DOB: 1941/02/11 , age 22 y.o. , MRN: [id number] , ADDRESS: [address] [place] [address] PCP Marcus Fess, MD Patient Care Team: Marcus Fess, MD as PCP - General (Family Medicine) Sueanne Margarita, MD as PCP - Cardiology (Cardiology)  This Provider for this visit: Treatment Team:  Attending Provider: Brand Males, MD    01/03/2022 -   Chief Complaint  Patient presents with   Follow-up    PFT performed today.  Pt states he is about the same since last visit. Pt stopped taking OFEV about 1 month ago due to having side effect of diarrhea.    HPI Marcus [name] 81 y.o. -returns for follow-up.  He tells me that he could not tolerate nintedanib because of all the side effects.  He says he stopped taking nintedanib over a month  ago and his quality of life is much better.  At this point in time he just wants to do expectant follow-up.  We discussed clinical trials as a care option but he finds it too restrictive on his lifestyle.  We discussed protocol with inhaled treprostinil is a phase 3 study for IPF.  It is less intense than the other studies.  However he does not like the idea that he has to do a nebulizer 4 times daily and each time at least 9-12 times puffs.  However he took the consent form for this.  He is currently taking some kind of a gummy that increases metabolism and he says it makes him feel better.  He wants to lose weight.  We discussed Ozempic.  He says he will bring this up with his primary care physician.       OV 06/14/2022  Subjective:  Patient ID: Marcus [name], male , DOB: 03/16/1941 , age 73 y.o. , MRN: [id number] , ADDRESS: [address] [place] [address] PCP Marcus Fess, MD Patient Care Team: Marcus Fess, MD as PCP - General (Family Medicine) Sueanne Margarita, MD as PCP - Cardiology (Cardiology)  This Provider for this  visit: Treatment Team:  Attending Provider: Brand Males, MD   IPF dx 08/25/2020 (reconfirmed MDD 11/17/20) : High PRob  clinical suspicion is you have IPF based on age greater than 8, male gender, Caucasian ethnicity, negative serology profile, history of acid reflux, previous exposure to smoke and copper and prior history of smoking and prob UIP on CT.   -On October 2023 visit the wife recollected mold in the house was in [place] in [place].  There is also a mold recently and he got rid of it.   IPF follow-up - Previous intolerance to pirfenidone. - Quit Ofev March 2023 - declined participation in cough study for IPF March 2023  06/14/2022 -   Chief Complaint  Patient presents with   Follow-up    Spiro/DLCO done today. Breathing is unchanged since the last visit. He tends to have a cough in the morning- non prod.      HPI Marcus [name] 81 y.o. -IVF follow-up.  He is on supportive care.  He presents with his wife who I am meeting for the first time.  At this point in time he feels his dyspnea is stable.  He says he has been dealing with other health issues that include obesity.  He has been on a keto diet and lost 35 pounds.  He has not entertained some of the newer medications.  He also deals with significant back pain and is using a cane.  He is also had facial swelling and has had a CT scan and apparently there is a dental abscess.  He is also needing tarsorrhaphy along with suffering from glaucoma.  This eye surgery was postponed because his wife had COVID.  Is not exposed to his wife with COVID.  His wife had COVID while she was traveling to [place] but the eye doctors misunderstood this and postponed his eye surgery.  At this point in time he is not interested in clinical trial.  Previously intolerant to approved antifibrotic's.  In the future he could be interested in clinical trials.  He wanted a handicap placard which we did for him.     SYMPTOM SCALE -  06/09/2020   08/25/2020  10/28/2020  12/08/2020  08/18/2021  10/14/2021  01/03/2022  06/14/2022   O2 use ra ra ra  ra ra a ra ra  Shortness of Breath 0 -> 5 scale with 5 being worst (score 6 If unable to do)    0     At rest 0 0 0 0 1.5 0 0 0  Simple tasks - showers, clothes change, eating, shaving 1 0 _0 Household (dishes, doing bed, laundry) _1 Shopping _2 1. _3 Walking level at own pace _4 1._5 Walking up Stairs _6 Total (30-36) Dyspnea Score _7 How bad is your cough? _8 How bad is your fatigue _9 How bad is nausea 0 0 0 0 0 0 0 0  How bad is vomiting?  00 0 0 0 0 0 0 0  How bad is diarrhea? 0 0 0 0 2 3 0 0  How bad is anxiety? 0 0 0 0 0 0 0 0  How bad is depression 0 0 0 0 0 0 0 1       Simple office walk 185 feet x  3 laps goal with forehead probe 06/09/2020  08/25/2020  10/28/2020  05/28/2021  10/14/2021   O2 used ra  ra ra ra  Number laps completed _10 Comments about pace nl 100% and 64/min moderate  avg  Resting Pulse Ox/HR 100% and 61/min  100% and 63 100% and 66 100% and 66  Final Pulse Ox/HR 97% and 98/min 98% and 80/min 99% and 100 98% and 1112 99% and 107  Desaturated </= 88% no      Desaturated <= 3% points yes      Got Tachycardic >/= 90/min yes      Symptoms at end of test x  Mod desypnea  Mild dyspnea  Miscellaneous comments x  Dyspnea started lap 1.NEver stopped      CT Chest data  No results found.    PFT     Latest Ref Rng & Units 06/14/2022    9:49 AM 01/03/2022    2:49 PM 10/28/2020    9:00 AM 04/02/2020    9:46 AM  PFT Results  FVC-Pre L 3.78  P 3.67  3.69  3.82   FVC-Predicted Pre % 81  P 78  74  75   FVC-Post L   3.68  3.85   FVC-Predicted Post %   73  76   Pre FEV1/FVC % % 81  P 81  80  80   Post FEV1/FCV % %   83  83   FEV1-Pre L 3.07  P 2.96  2.94  3.04   FEV1-Predicted Pre % 92  P 87  81  83   FEV1-Post L   3.06  3.20   DLCO uncorrected  ml/min/mmHg 19.97  P 20.48  21.84  23.34   DLCO UNC% % 73  P 75  76  81   DLCO corrected ml/min/mmHg 19.97  P 20.48  21.84  23.34   DLCO COR %Predicted % 73  P 75  76  81   DLVA Predicted % 90  P 93  99  103   TLC L   5.72  5.76   TLC % Predicted %   70  71   RV % Predicted %   72  72     P Preliminary result       has a past medical history of Ascending aorta dilatation (HCC), Benign essential HTN (09/09/2013), Coronary artery disease (08/2013), Dyslipidemia, Elevated fasting glucose, Fatty liver, Glaucoma, Gout, Hyperlipidemia, Hypertension, Joint pain, LAD (lymphadenopathy), Obesity, Pure hypercholesterolemia (09/09/2013), PVD (peripheral vascular disease) ([facility]) (06/12/2018), and Vision abnormalities.   reports that he quit smoking about 2 years ago. His smoking use included cigars. He has never used smokeless tobacco.  Past Surgical History:  Procedure Laterality Date   ADENOIDECTOMY     CATARACT EXTRACTION     CORONARY ANGIOPLASTY WITH STENT PLACEMENT  2009   EYE SURGERY     TONSILLECTOMY     WISDOM TOOTH EXTRACTION      Allergies  Allergen Reactions   Pirfenidone Rash   Ofev [Nintedanib] Diarrhea    Immunization History  Administered Date(s) Administered   Fluad Quad(high Dose 65+) 05/13/2020   Influenza, High Dose Seasonal PF 06/06/2019, 08/13/2021   Influenza-Unspecified 06/08/2020   PFIZER(Purple Top)SARS-COV-2 Vaccination 09/28/2019, 10/19/2019, 06/08/2020, 02/05/2021   Pneumococcal Conjugate-13 10/23/2014   Pneumococcal Polysaccharide-23 08/27/2012   Tdap 08/27/2012   Zoster, Live 08/29/2012, 09/28/2019, 03/11/2020    Family History  Problem Relation Age of Onset   Breast cancer Mother    Heart attack Father    Rheumatic fever Father    CAD Sister    Multiple sclerosis Sister    CAD Brother    Neuropathy Neg Hx      Current Outpatient Medications:    allopurinol (ZYLOPRIM) 300 MG tablet, Take 300 mg by mouth daily., Disp: , Rfl:    aspirin 81 MG  tablet, Take 81 mg by mouth daily., Disp: , Rfl:    atorvastatin (LIPITOR) 80 MG tablet, Take 1 tablet (80 mg total) by mouth daily., Disp: 90 tablet, Rfl: 3   dorzolamide-timolol (COSOPT) 22.3-6.8 MG/ML ophthalmic solution, Place 1 drop into the left eye 2 (two) times daily., Disp: , Rfl:    fluticasone (FLONASE) 50 MCG/ACT nasal spray, Place 2 sprays into both nostrils as needed., Disp: , Rfl:    latanoprost (XALATAN) 0.005 % ophthalmic solution, Place 1 drop into the left eye daily., Disp: , Rfl:    losartan (COZAAR) 100 MG tablet, Take 1 tablet (100 mg total) by mouth daily., Disp: 90 tablet, Rfl: 3   metoprolol succinate (TOPROL-XL) 100 MG 24 hr tablet, Take with or immediately following a meal., Disp: 90 tablet, Rfl: 3   minocycline (MINOCIN,DYNACIN) 100 MG capsule, Take 100 mg by mouth as needed. For skin acne, Disp: , Rfl: 3   Netarsudil-Latanoprost (ROCKLATAN) 0.02-0.005 % SOLN, As directed in left eye, Disp: , Rfl:    nitroGLYCERIN (NITROSTAT) 0.4 MG SL tablet, Place 0.4 mg under the tongue every 5 (five) minutes as needed for chest pain. , Disp: , Rfl:    pimecrolimus (ELIDEL) 1 % cream, APPLY TO AFFECTED AREA ON FACE TWICE A DAY AS NEEDED, Disp: , Rfl:    tamsulosin (FLOMAX) 0.4 MG CAPS capsule, Take 0.4 mg by mouth daily., Disp: , Rfl:  No current facility-administered medications for this visit.  Facility-Administered Medications Ordered in Other Visits:    technetium tetrofosmin (TC-MYOVIEW) injection 09.3 millicurie, 26.7 millicurie, Intravenous, Once PRN, Turner, Traci R, MD      Objective:   Vitals:   06/14/22 1033  BP: 122/74  Pulse: (!) 56  Temp: 98.1 F (36.7 C)  TempSrc: Oral  SpO2: 98%  Weight: 276 lb 9.6 oz (125.5 kg)  Height: _0  (1.905 m)    Estimated body mass index is 34.57 kg/m as calculated from the following:   Height as of this encounter: _1  (1.905 m).   Weight as of this encounter: 276 lb 9.6 oz (125.5 kg).  _2 @  Filed Weights    06/14/22 1033  Weight: 276 lb 9.6 oz (125.5 kg)     Physical Exam    General: No distress. Obese has cane Neuro: Alert and Oriented x 3. GCS 15. Speech normal Psych: Pleasant Resp:  Barrel Chest - n.  Wheeze - no, Crackles - mild maybe, No overt respiratory distress CVS: Normal heart sounds. Murmurs - no Ext: Stigmata of Connective Tissue Disease - no HEENT: Normal upper airway. PEERL +. No post nasal drip        Assessment:       ICD-10-CM   1. IPF (idiopathic pulmonary fibrosis) ([facility])  J84.112     2. Obesity (BMI 30.0-34.9)  E66.9          Plan:     Patient Instructions     ICD-10-CM   1. IPF (idiopathic pulmonary fibrosis) ([place])  J84.112     2. Obesity (BMI 30.0-34.9)  E66.9          Clinically stable pulmonary fibrosis based on symptoms but perhaps very mild progression on testing over time  On supportive care due to side effects of both ofev and esbriet  Plan [based on shared decision making] - do signature for [employer] handicap placard  - No more anti-fibrotic - Hold off clinical trials for now due to other health issues - talk to PCP about ozempic or wegovy for weight loss  - congratulations on weight loss so far - address back pain and eye issues - high dose flu shot, RSV and covid shots on your own  Followup - Do spirometry and DLCO in 16 weeks -Return to see Dr. Chase [name] in 16 weeks  -Symptom score and walk test at follow-up  ( Level 05 visit: Estb 40-54 min   in  visit type: on-site physical face to visit  in total care time and counseling or/and coordination of care by this undersigned MD - Dr Brand Males. This includes one or more of the following on this same day 06/14/2022: pre-charting, chart review, note writing, documentation discussion of test results, diagnostic or treatment recommendations, prognosis, risks and benefits of management options, instructions, education, compliance or risk-factor reduction. It excludes  time spent by the [facility] or office staff in the care of the patient. Actual time 75 min)   SIGNATURE    Dr. Brand Males, M.D., F.C.C.P,  Pulmonary and Critical Care Medicine Staff Physician, [place] Director - Interstitial Lung Disease  Program  Pulmonary [facility] [place] [facility] [place], [place], [address]  Pager: [phone], If no answer or between  15:00h - 7:00h: call [phone] Telephone: [phone]  11:42 AM 06/14/2022

## 2022-06-14 NOTE — Patient Instructions (Addendum)
ICD-10-CM   1. IPF (idiopathic pulmonary fibrosis) ([facility])  J84.112     2. Obesity (BMI 30.0-34.9)  E66.9          Clinically stable pulmonary fibrosis based on symptoms but perhaps very mild progression on testing over time  On supportive care due to side effects of both ofev and esbriet  Plan [based on shared decision making] - do signature for [employer] handicap placard  - No more anti-fibrotic - Hold off clinical trials for now due to other health issues - talk to PCP about ozempic or wegovy for weight loss  - congratulations on weight loss so far - address back pain and eye issues - high dose flu shot, RSV and covid shots on your own  Followup - Do spirometry and DLCO in 16 weeks -Return to see Dr. Chase Caller in 16 weeks  -Symptom score and walk test at follow-up

## 2022-06-14 NOTE — Progress Notes (Signed)
Spirometry and DLCO Performed Today.  

## 2022-06-17 DIAGNOSIS — Z23 Encounter for immunization: Secondary | ICD-10-CM | POA: Diagnosis not present

## 2022-06-29 DIAGNOSIS — H02135 Senile ectropion of left lower eyelid: Secondary | ICD-10-CM | POA: Diagnosis not present

## 2022-06-29 DIAGNOSIS — H04223 Epiphora due to insufficient drainage, bilateral lacrimal glands: Secondary | ICD-10-CM | POA: Diagnosis not present

## 2022-06-29 DIAGNOSIS — H02132 Senile ectropion of right lower eyelid: Secondary | ICD-10-CM | POA: Diagnosis not present

## 2022-06-29 DIAGNOSIS — H57813 Brow ptosis, bilateral: Secondary | ICD-10-CM | POA: Diagnosis not present

## 2022-06-29 DIAGNOSIS — H02423 Myogenic ptosis of bilateral eyelids: Secondary | ICD-10-CM | POA: Diagnosis not present

## 2022-06-29 DIAGNOSIS — H01115 Allergic dermatitis of left lower eyelid: Secondary | ICD-10-CM | POA: Diagnosis not present

## 2022-06-29 DIAGNOSIS — H16203 Unspecified keratoconjunctivitis, bilateral: Secondary | ICD-10-CM | POA: Diagnosis not present

## 2022-06-29 DIAGNOSIS — H53483 Generalized contraction of visual field, bilateral: Secondary | ICD-10-CM | POA: Diagnosis not present

## 2022-06-29 DIAGNOSIS — H0279 Other degenerative disorders of eyelid and periocular area: Secondary | ICD-10-CM | POA: Diagnosis not present

## 2022-06-29 DIAGNOSIS — H01114 Allergic dermatitis of left upper eyelid: Secondary | ICD-10-CM | POA: Diagnosis not present

## 2022-07-27 ENCOUNTER — Ambulatory Visit: Payer: Medicare HMO | Attending: Cardiology | Admitting: Cardiology

## 2022-07-27 ENCOUNTER — Encounter: Payer: Self-pay | Admitting: Cardiology

## 2022-07-27 VITALS — BP 152/62 | HR 57 | Ht 75.0 in | Wt 275.8 lb

## 2022-07-27 DIAGNOSIS — I739 Peripheral vascular disease, unspecified: Secondary | ICD-10-CM

## 2022-07-27 DIAGNOSIS — J849 Interstitial pulmonary disease, unspecified: Secondary | ICD-10-CM

## 2022-07-27 DIAGNOSIS — N1831 Chronic kidney disease, stage 3a: Secondary | ICD-10-CM | POA: Diagnosis not present

## 2022-07-27 DIAGNOSIS — I10 Essential (primary) hypertension: Secondary | ICD-10-CM | POA: Diagnosis not present

## 2022-07-27 DIAGNOSIS — I2510 Atherosclerotic heart disease of native coronary artery without angina pectoris: Secondary | ICD-10-CM

## 2022-07-27 DIAGNOSIS — R0609 Other forms of dyspnea: Secondary | ICD-10-CM

## 2022-07-27 DIAGNOSIS — E7800 Pure hypercholesterolemia, unspecified: Secondary | ICD-10-CM

## 2022-07-27 DIAGNOSIS — G4733 Obstructive sleep apnea (adult) (pediatric): Secondary | ICD-10-CM | POA: Diagnosis not present

## 2022-07-27 LAB — BASIC METABOLIC PANEL
BUN/Creatinine Ratio: 17 (ref 10–24)
BUN: 27 mg/dL (ref 8–27)
CO2: 22 mmol/L (ref 20–29)
Calcium: 10 mg/dL (ref 8.6–10.2)
Chloride: 107 mmol/L — ABNORMAL HIGH (ref 96–106)
Creatinine, Ser: 1.56 mg/dL — ABNORMAL HIGH (ref 0.76–1.27)
Glucose: 107 mg/dL — ABNORMAL HIGH (ref 70–99)
Potassium: 5.3 mmol/L — ABNORMAL HIGH (ref 3.5–5.2)
Sodium: 141 mmol/L (ref 134–144)
eGFR: 45 mL/min/{1.73_m2} — ABNORMAL LOW (ref >59)

## 2022-07-27 MED ORDER — METOPROLOL SUCCINATE ER 100 MG PO TB24: ORAL_TABLET | ORAL | 3 refills | Status: DC

## 2022-07-27 MED ORDER — ATORVASTATIN CALCIUM 80 MG PO TABS: 80.0000 mg | ORAL_TABLET | Freq: Every day | ORAL | 3 refills | Status: DC

## 2022-07-27 MED ORDER — LOSARTAN POTASSIUM 100 MG PO TABS: 100.0000 mg | ORAL_TABLET | Freq: Every day | ORAL | 3 refills | Status: DC

## 2022-07-27 NOTE — Addendum Note (Signed)
Addended by: Bernestine Amass on: 07/27/2022 11:21 AM   Modules accepted: Orders

## 2022-07-27 NOTE — Progress Notes (Signed)
Cardiology Office Note:    Date:  07/27/2022   ID:  Marcus [name], DOB Apr 20, 1941, MRN [id number]  PCP:  Hulan Fess, MD  Cardiologist:  Fransico Him, MD    Referring MD: Hulan Fess, MD   Chief Complaint  Patient presents with   Coronary Artery Disease   Hypertension   Hyperlipidemia    History of Present Illness:    Marcus [name] is a 81 y.o. male with a hx of  ASCAD, HTN and dyslipidemia. He has a history of elevated ALT and was evaluated by GI and US showed an enlarged liver.  He stopped ETOH and tylenol and his ALT normalized.  LE arterial dopplers 20017 showed 30-49% left common femoral stenosis.  Stress test 02/2020 showed no ischemia.   2D echo done for CP and SOB 2022 showed showed normal LV function with grade 1 diastolic dysfunction and mild AI. He was dx with idiopathic pulmonary fibrosis and is followed by Pulmonary and has chronic DOE.  He is followed by Dr. Chase Caller with pulmonary.  He had a sleep study in 2021 showing mild obstructive sleep apnea with an AHI of 7.6/h and CPAP titration was recommended but patient declined stating he did not want to use CPAP.  He is here today for followup and is doing well.  He has chronic DOE related to his ILD that he thinks is stable.  He has had some LE edema from time to time but after losing weight that has improved. He denies any chest pain or pressure, PND, orthopnea, dizziness, palpitations or syncope. He is compliant with his meds and is tolerating meds with no SE.    Past Medical History:  Diagnosis Date   Ascending aorta dilatation ([place])    41m gy echo 01/2020   Benign essential HTN 09/09/2013   Coronary artery disease 08/2013   S/P PCI of the LAD   Dyslipidemia    Elevated fasting glucose    Fatty liver    Glaucoma    Gout    Hyperlipidemia    Hypertension    Joint pain    LAD (lymphadenopathy)    Obesity    Pure hypercholesterolemia 09/09/2013   PVD (peripheral vascular disease) ([place] 06/12/2018      30-49% left common femoral stenosis   Vision abnormalities     Past Surgical History:  Procedure Laterality Date   ADENOIDECTOMY     CATARACT EXTRACTION     CORONARY ANGIOPLASTY WITH STENT PLACEMENT  2009   EYE SURGERY     TONSILLECTOMY     WISDOM TOOTH EXTRACTION      Current Medications: Current Meds  Medication Sig   allopurinol (ZYLOPRIM) 300 MG tablet Take 300 mg by mouth daily.   aspirin 81 MG tablet Take 81 mg by mouth daily.   Cyanocobalamin (VITAMIN B-12 PO) Take 1 capsule by mouth daily.   dorzolamide-timolol (COSOPT) 22.3-6.8 MG/ML ophthalmic solution Place 1 drop into the left eye 2 (two) times daily.   ferrous sulfate 325 (65 FE) MG EC tablet Take 325 mg by mouth 3 (three) times daily with meals.   fluticasone (FLONASE) 50 MCG/ACT nasal spray Place 2 sprays into both nostrils as needed.   latanoprost (XALATAN) 0.005 % ophthalmic solution Place 1 drop into the left eye daily.   minocycline (MINOCIN,DYNACIN) 100 MG capsule Take 100 mg by mouth as needed. For skin acne   Netarsudil-Latanoprost (ROCKLATAN) 0.02-0.005 % SOLN As directed in left eye   nitroGLYCERIN (NITROSTAT) 0.4 MG SL tablet Place  0.4 mg under the tongue every 5 (five) minutes as needed for chest pain.    Omega-3 Fatty Acids (OMEGA 3 PO) Take 2 capsules by mouth in the morning and at bedtime. 2000 mg BID   pimecrolimus (ELIDEL) 1 % cream APPLY TO AFFECTED AREA ON FACE TWICE A DAY AS NEEDED   tamsulosin (FLOMAX) 0.4 MG CAPS capsule Take 0.4 mg by mouth daily.   [DISCONTINUED] atorvastatin (LIPITOR) 80 MG tablet Take 1 tablet (80 mg total) by mouth daily.   [DISCONTINUED] losartan (COZAAR) 100 MG tablet Take 1 tablet (100 mg total) by mouth daily.   [DISCONTINUED] metoprolol succinate (TOPROL-XL) 100 MG 24 hr tablet Take with or immediately following a meal.     Allergies:   Pirfenidone and Ofev [nintedanib]   Social History   Socioeconomic History   Marital status: Married    Spouse name: Not on file    Number of children: Not on file   Years of education: Not on file   Highest education level: Not on file  Occupational History   Not on file  Tobacco Use   Smoking status: Former    Types: Cigars    Quit date: 02/07/2020    Years since quitting: 2.4   Smokeless tobacco: Never   Tobacco comments:    3-4 cigars daily.  off and on for 10 years.  Stopped smoking cigarettes at 23.  smoked from age [age], 2 packs per day.  Stopped smoking cigars back in May 2021.  Substance and Sexual Activity   Alcohol use: Yes    Alcohol/week: 1.0 standard drink of alcohol    Types: 1 Cans of beer per week    Comment: 2-3 beers daily   Drug use: No   Sexual activity: Not on file  Other Topics Concern   Not on file  Social History Narrative   Not on file   Social Determinants of Health   Financial Resource Strain: Not on file  Food Insecurity: Not on file  Transportation Needs: Not on file  Physical Activity: Not on file  Stress: Not on file  Social Connections: Not on file     Family History: The patient's family history includes Breast cancer in his mother; CAD in his brother and sister; Heart attack in his father; Multiple sclerosis in his sister; Rheumatic fever in his father. There is no history of Neuropathy.  ROS:   Please see the history of present illness.    ROS  All other systems reviewed and negative.   EKGs/Labs/Other Studies Reviewed:    The following studies were reviewed today: none  EKG:  EKG is ordered today and demonstrates NSR with no ST changes  Recent Labs: 08/17/2021: BUN 30; Creatinine, Ser 1.45; Potassium 5.1; Sodium 143 09/27/2021: ALT 37; Hemoglobin 13.0; Platelets 208.0   Recent Lipid Panel    Component Value Date/Time   CHOL 115 08/17/2021 0916   CHOL 105 08/01/2014 0835   TRIG 186 (H) 08/17/2021 0916   TRIG 182 (H) 08/01/2014 0835   HDL 33 (L) 08/17/2021 0916   HDL 26 (L) 08/01/2014 0835   CHOLHDL 3.5 08/17/2021 0916   CHOLHDL 3.7 04/18/2016  0922   VLDL 41 (H) 04/18/2016 0922   LDLCALC 51 08/17/2021 0916   LDLCALC 43 08/01/2014 0835    Physical Exam:    VS:  BP (!) 152/62   Pulse (!) 57   Ht _0  (1.905 m)   Wt 275 lb 12.8 oz (125.1 kg)   SpO2  97%   BMI 34.47 kg/m     Wt Readings from Last 3 Encounters:  07/27/22 275 lb 12.8 oz (125.1 kg)  06/14/22 276 lb 9.6 oz (125.5 kg)  02/21/22 288 lb (130.6 kg)    GEN: Well nourished, well developed in no acute distress HEENT: Normal NECK: No JVD; No carotid bruits LYMPHATICS: No lymphadenopathy CARDIAC:RRR, no murmurs, rubs, gallops RESPIRATORY:  Clear to auscultation without rales, wheezing or rhonchi  ABDOMEN: Soft, non-tender, non-distended MUSCULOSKELETAL:  No edema; No deformity  SKIN: Warm and dry NEUROLOGIC:  Alert and oriented x 3 PSYCHIATRIC:  Normal affect  ASSESSMENT:    1. Atherosclerosis of native coronary artery of native heart without angina pectoris   2. Benign essential HTN   3. Pure hypercholesterolemia   4. PVD (peripheral vascular disease) ([place])   5. OSA (obstructive sleep apnea)   6. ILD (interstitial lung disease) ([place])   7. DOE (dyspnea on exertion)   8. Stage 3a chronic kidney disease ([place])    PLAN:    In order of problems listed above:  1.  ASCAD/DOE -s/p remote PCI of LAD -Lexiscan Myoview 2021 showed no ischemia -2D echo 02/2021 showed normal LV function with grade 1 diastolic dysfunction -His chronic shortness of breath from ILD is stable and he denies any anginal symptoms -continue prescription drug managed with aspirin 81 mg daily, high-dose statin therapy and Toprol-XL 100 mg daily with as needed refills  2.  HTN -BP is borderline controlled on exam today -he wore a 48 hour BP monitor that looked great with good BP control so likely has white coat HTN -Continue prescription management with losartan 100 mg daily Toprol-XL 100 mg daily with as needed refills -I have personally reviewed and interpreted outside labs performed by  patient's PCP which showed serum creatinine 1.72 and potassium 4.8 on 06/13/2022  3.  HLD -LDL goal < 70 -I have personally reviewed and interpreted outside labs performed by patient's PCP which showed LDL 45 and HDL 30 as well as ALT 35 and 04/28/2022 -Continue prescription management atorvastatin 80 mg daily with as needed refills  4.  PVD -LE arterial dopplers 20017 showed 30-49% left common femoral stenosis.   -He has not had any claudication symptoms -encouraged walking program -continue statin  5.  Obstructive sleep apnea -Mild by home sleep study 2021 -Patient refused CPAP therapy  6.  Chronic shortness of breath -Related to interstitial lung disease -Followed by pulmonary  7.  CKD stage 3a -SCr bumped last month to 1.73 -repeat BMET since he is on ARB  Medication Adjustments/Labs and Tests Ordered: Current medicines are reviewed at length with the patient today.  Concerns regarding medicines are outlined above.  Orders Placed This Encounter  Procedures   EKG 12-Lead    Meds ordered this encounter  Medications   atorvastatin (LIPITOR) 80 MG tablet    Sig: Take 1 tablet (80 mg total) by mouth daily.    Dispense:  90 tablet    Refill:  3   metoprolol succinate (TOPROL-XL) 100 MG 24 hr tablet    Sig: Take with or immediately following a meal.    Dispense:  90 tablet    Refill:  3   losartan (COZAAR) 100 MG tablet    Sig: Take 1 tablet (100 mg total) by mouth daily.    Dispense:  90 tablet    Refill:  3    Signed, Fransico Him, MD  07/27/2022 11:15 AM    [facility]

## 2022-07-27 NOTE — Patient Instructions (Signed)
Medication Instructions:  Your physician recommends that you continue on your current medications as directed. Please refer to the Current Medication list given to you today.  *If you need a refill on your cardiac medications before your next appointment, please call your pharmacy*  Lab Work: TODAY: BMET If you have labs (blood work) drawn today and your tests are completely normal, you will receive your results only by: [place] (if you have MyChart) OR A paper copy in the mail If you have any lab test that is abnormal or we need to change your treatment, we will call you to review the results.  Follow-Up: At [facility], you and your health needs are our priority.  As part of our continuing mission to provide you with exceptional heart care, we have created designated Provider Care Teams.  These Care Teams include your primary Cardiologist (physician) and Advanced Practice Providers (APPs -  Physician Assistants and Nurse Practitioners) who all work together to provide you with the care you need, when you need it.  Your next appointment:   1 year(s)  The format for your next appointment:   In Person  Provider:   Fransico Him, MD     Important Information About Sugar

## 2022-07-29 ENCOUNTER — Other Ambulatory Visit (HOSPITAL_COMMUNITY): Payer: Self-pay | Admitting: Otolaryngology

## 2022-07-29 ENCOUNTER — Encounter: Payer: Self-pay | Admitting: Cardiology

## 2022-07-29 DIAGNOSIS — K118 Other diseases of salivary glands: Secondary | ICD-10-CM

## 2022-07-29 DIAGNOSIS — Z87891 Personal history of nicotine dependence: Secondary | ICD-10-CM | POA: Diagnosis not present

## 2022-07-29 DIAGNOSIS — Z9049 Acquired absence of other specified parts of digestive tract: Secondary | ICD-10-CM | POA: Diagnosis not present

## 2022-07-29 NOTE — Telephone Encounter (Signed)
error 

## 2022-08-01 ENCOUNTER — Other Ambulatory Visit (HOSPITAL_COMMUNITY): Payer: Self-pay | Admitting: Otolaryngology

## 2022-08-01 DIAGNOSIS — K118 Other diseases of salivary glands: Secondary | ICD-10-CM

## 2022-08-01 NOTE — Progress Notes (Signed)
Marcus Peaches, MD  Donita Brooks D; P [facility] Procedure Requests Approved for [place] core bx of left parotid nodule.  [place]

## 2022-08-08 ENCOUNTER — Ambulatory Visit (HOSPITAL_COMMUNITY)
Admission: RE | Admit: 2022-08-08 | Discharge: 2022-08-08 | Disposition: A | Discharge status: Home / Self Care | Payer: Medicare HMO | Source: Ambulatory Visit | Attending: Otolaryngology | Admitting: Otolaryngology

## 2022-08-08 DIAGNOSIS — K116 Mucocele of salivary gland: Secondary | ICD-10-CM | POA: Diagnosis not present

## 2022-08-08 DIAGNOSIS — K118 Other diseases of salivary glands: Secondary | ICD-10-CM | POA: Insufficient documentation

## 2022-08-08 MED ORDER — LIDOCAINE HCL (PF) 1 % IJ SOLN
INTRAMUSCULAR | Status: AC
  Filled 2022-08-08: qty 30

## 2022-08-08 MED ORDER — LIDOCAINE HCL (PF) 1 % IJ SOLN: 8.0000 mL | Freq: Once | INTRAMUSCULAR | Status: DC

## 2022-08-08 NOTE — Procedures (Signed)
Vascular and Interventional Radiology Procedure Note  Patient: Marcus [name] DOB: [birth year] Medical Record Number: [id number] Note Date/Time: 08/08/22 2:52 PM   Performing Physician: Michaelle Birks, MD Assistant(s): None  Diagnosis: LEFT parotid mixed cystic and solid lesion  Procedure: LEFT PAROTID CYSTIC MASS BIOPSY and ASPIRATION  Anesthesia: Local Anesthetic Complications: None Estimated Blood Loss: Minimal Specimens: Sent for Cytology and Pathology  Findings:  Successful Ultrasound-guided biopsy of LEFT parotid mixed cystic and solid lesion. A total of 4 samples were obtained. Hemostasis of the tract was achieved using Manual Pressure.  Plan: Bed rest for 0 hours.  See detailed procedure note with images in PACS. The patient tolerated the procedure well without incident or complication and was returned to Recovery in stable condition.    Michaelle Birks, MD [facility] Radiology   Pager. [place]

## 2022-08-09 LAB — SURGICAL PATHOLOGY

## 2022-08-11 LAB — CYTOLOGY - NON PAP

## 2022-08-12 DIAGNOSIS — D649 Anemia, unspecified: Secondary | ICD-10-CM | POA: Diagnosis not present

## 2022-08-13 DIAGNOSIS — H401113 Primary open-angle glaucoma, right eye, severe stage: Secondary | ICD-10-CM | POA: Diagnosis not present

## 2022-08-13 DIAGNOSIS — H401123 Primary open-angle glaucoma, left eye, severe stage: Secondary | ICD-10-CM | POA: Diagnosis not present

## 2022-08-18 ENCOUNTER — Emergency Department (HOSPITAL_COMMUNITY): Payer: Medicare HMO

## 2022-08-18 ENCOUNTER — Inpatient Hospital Stay (HOSPITAL_COMMUNITY)
Admission: EM | Admit: 2022-08-18 | Discharge: 2022-08-22 | DRG: 155 | Disposition: A | Discharge status: Home / Self Care | Payer: Medicare HMO | Attending: Family Medicine | Admitting: Family Medicine

## 2022-08-18 DIAGNOSIS — I739 Peripheral vascular disease, unspecified: Secondary | ICD-10-CM | POA: Diagnosis present

## 2022-08-18 DIAGNOSIS — Z955 Presence of coronary angioplasty implant and graft: Secondary | ICD-10-CM

## 2022-08-18 DIAGNOSIS — Z7982 Long term (current) use of aspirin: Secondary | ICD-10-CM

## 2022-08-18 DIAGNOSIS — I10 Essential (primary) hypertension: Secondary | ICD-10-CM

## 2022-08-18 DIAGNOSIS — K1121 Acute sialoadenitis: Secondary | ICD-10-CM | POA: Diagnosis not present

## 2022-08-18 DIAGNOSIS — K1120 Sialoadenitis, unspecified: Secondary | ICD-10-CM | POA: Diagnosis not present

## 2022-08-18 DIAGNOSIS — N1831 Chronic kidney disease, stage 3a: Secondary | ICD-10-CM | POA: Diagnosis present

## 2022-08-18 DIAGNOSIS — J849 Interstitial pulmonary disease, unspecified: Secondary | ICD-10-CM | POA: Diagnosis not present

## 2022-08-18 DIAGNOSIS — N179 Acute kidney failure, unspecified: Secondary | ICD-10-CM | POA: Diagnosis not present

## 2022-08-18 DIAGNOSIS — I5032 Chronic diastolic (congestive) heart failure: Secondary | ICD-10-CM | POA: Diagnosis present

## 2022-08-18 DIAGNOSIS — K113 Abscess of salivary gland: Secondary | ICD-10-CM | POA: Diagnosis not present

## 2022-08-18 DIAGNOSIS — G4733 Obstructive sleep apnea (adult) (pediatric): Secondary | ICD-10-CM | POA: Diagnosis present

## 2022-08-18 DIAGNOSIS — I130 Hypertensive heart and chronic kidney disease with heart failure and stage 1 through stage 4 chronic kidney disease, or unspecified chronic kidney disease: Secondary | ICD-10-CM | POA: Diagnosis present

## 2022-08-18 DIAGNOSIS — L089 Local infection of the skin and subcutaneous tissue, unspecified: Secondary | ICD-10-CM | POA: Diagnosis not present

## 2022-08-18 DIAGNOSIS — E7800 Pure hypercholesterolemia, unspecified: Secondary | ICD-10-CM | POA: Diagnosis not present

## 2022-08-18 DIAGNOSIS — I2510 Atherosclerotic heart disease of native coronary artery without angina pectoris: Secondary | ICD-10-CM | POA: Diagnosis not present

## 2022-08-18 DIAGNOSIS — Z79899 Other long term (current) drug therapy: Secondary | ICD-10-CM

## 2022-08-18 DIAGNOSIS — Z91199 Patient's noncompliance with other medical treatment and regimen due to unspecified reason: Secondary | ICD-10-CM | POA: Diagnosis not present

## 2022-08-18 DIAGNOSIS — K760 Fatty (change of) liver, not elsewhere classified: Secondary | ICD-10-CM | POA: Diagnosis present

## 2022-08-18 DIAGNOSIS — I77810 Thoracic aortic ectasia: Secondary | ICD-10-CM | POA: Diagnosis present

## 2022-08-18 DIAGNOSIS — Z888 Allergy status to other drugs, medicaments and biological substances status: Secondary | ICD-10-CM

## 2022-08-18 DIAGNOSIS — E872 Acidosis, unspecified: Secondary | ICD-10-CM | POA: Diagnosis not present

## 2022-08-18 DIAGNOSIS — Z8249 Family history of ischemic heart disease and other diseases of the circulatory system: Secondary | ICD-10-CM | POA: Diagnosis not present

## 2022-08-18 DIAGNOSIS — M109 Gout, unspecified: Secondary | ICD-10-CM | POA: Diagnosis not present

## 2022-08-18 DIAGNOSIS — R221 Localized swelling, mass and lump, neck: Secondary | ICD-10-CM | POA: Diagnosis not present

## 2022-08-18 DIAGNOSIS — Z6841 Body Mass Index (BMI) 40.0 and over, adult: Secondary | ICD-10-CM

## 2022-08-18 DIAGNOSIS — Z87891 Personal history of nicotine dependence: Secondary | ICD-10-CM

## 2022-08-18 DIAGNOSIS — B9561 Methicillin susceptible Staphylococcus aureus infection as the cause of diseases classified elsewhere: Secondary | ICD-10-CM | POA: Diagnosis present

## 2022-08-18 DIAGNOSIS — N183 Chronic kidney disease, stage 3 unspecified: Secondary | ICD-10-CM | POA: Diagnosis present

## 2022-08-18 DIAGNOSIS — H409 Unspecified glaucoma: Secondary | ICD-10-CM | POA: Diagnosis present

## 2022-08-18 HISTORY — DX: Chronic kidney disease, unspecified: N18.9

## 2022-08-18 LAB — HEPATIC FUNCTION PANEL
ALT: 44 U/L (ref 0–44)
AST: 42 U/L — ABNORMAL HIGH (ref 15–41)
Albumin: 2.5 g/dL — ABNORMAL LOW (ref 3.5–5.0)
Alkaline Phosphatase: 52 U/L (ref 38–126)
Bilirubin, Direct: 0.4 mg/dL — ABNORMAL HIGH (ref 0.0–0.2)
Indirect Bilirubin: 1.3 mg/dL — ABNORMAL HIGH (ref 0.3–0.9)
Total Bilirubin: 1.7 mg/dL — ABNORMAL HIGH (ref 0.3–1.2)
Total Protein: 6.1 g/dL — ABNORMAL LOW (ref 6.5–8.1)

## 2022-08-18 LAB — CBC WITH DIFFERENTIAL/PLATELET
Abs Immature Granulocytes: 0.14 10*3/uL — ABNORMAL HIGH (ref 0.00–0.07)
Basophils Absolute: 0.1 10*3/uL (ref 0.0–0.1)
Basophils Relative: 0 %
Eosinophils Absolute: 0.3 10*3/uL (ref 0.0–0.5)
Eosinophils Relative: 2 %
HCT: 38.4 % — ABNORMAL LOW (ref 39.0–52.0)
Hemoglobin: 12.7 g/dL — ABNORMAL LOW (ref 13.0–17.0)
Immature Granulocytes: 1 %
Lymphocytes Relative: 5 %
Lymphs Abs: 0.8 10*3/uL (ref 0.7–4.0)
MCH: 33.5 pg (ref 26.0–34.0)
MCHC: 33.1 g/dL (ref 30.0–36.0)
MCV: 101.3 fL — ABNORMAL HIGH (ref 80.0–100.0)
Monocytes Absolute: 1.1 10*3/uL — ABNORMAL HIGH (ref 0.1–1.0)
Monocytes Relative: 7 %
Neutro Abs: 13.6 10*3/uL — ABNORMAL HIGH (ref 1.7–7.7)
Neutrophils Relative %: 85 %
Platelets: 263 10*3/uL (ref 150–400)
RBC: 3.79 MIL/uL — ABNORMAL LOW (ref 4.22–5.81)
RDW: 14.3 % (ref 11.5–15.5)
WBC: 16 10*3/uL — ABNORMAL HIGH (ref 4.0–10.5)
nRBC: 0 % (ref 0.0–0.2)

## 2022-08-18 LAB — COMPREHENSIVE METABOLIC PANEL
ALT: 53 U/L — ABNORMAL HIGH (ref 0–44)
AST: 41 U/L (ref 15–41)
Albumin: 2.7 g/dL — ABNORMAL LOW (ref 3.5–5.0)
Alkaline Phosphatase: 64 U/L (ref 38–126)
Anion gap: 8 (ref 5–15)
BUN: 38 mg/dL — ABNORMAL HIGH (ref 8–23)
CO2: 20 mmol/L — ABNORMAL LOW (ref 22–32)
Calcium: 10 mg/dL (ref 8.9–10.3)
Chloride: 112 mmol/L — ABNORMAL HIGH (ref 98–111)
Creatinine, Ser: 2.22 mg/dL — ABNORMAL HIGH (ref 0.61–1.24)
GFR, Estimated: 29 mL/min — ABNORMAL LOW (ref >60)
Glucose, Bld: 127 mg/dL — ABNORMAL HIGH (ref 70–99)
Potassium: 5.1 mmol/L (ref 3.5–5.1)
Sodium: 140 mmol/L (ref 135–145)
Total Bilirubin: 0.5 mg/dL (ref 0.3–1.2)
Total Protein: 7.1 g/dL (ref 6.5–8.1)

## 2022-08-18 LAB — URINALYSIS, COMPLETE (UACMP) WITH MICROSCOPIC
Bacteria, UA: NONE SEEN
Bilirubin Urine: NEGATIVE
Glucose, UA: NEGATIVE mg/dL
Hgb urine dipstick: NEGATIVE
Ketones, ur: NEGATIVE mg/dL
Leukocytes,Ua: NEGATIVE
Nitrite: NEGATIVE
Protein, ur: NEGATIVE mg/dL
Specific Gravity, Urine: 1.017 (ref 1.005–1.030)
pH: 5 (ref 5.0–8.0)

## 2022-08-18 LAB — I-STAT CHEM 8, ED
BUN: 41 mg/dL — ABNORMAL HIGH (ref 8–23)
Calcium, Ion: 1.35 mmol/L (ref 1.15–1.40)
Chloride: 112 mmol/L — ABNORMAL HIGH (ref 98–111)
Creatinine, Ser: 2.4 mg/dL — ABNORMAL HIGH (ref 0.61–1.24)
Glucose, Bld: 123 mg/dL — ABNORMAL HIGH (ref 70–99)
HCT: 38 % — ABNORMAL LOW (ref 39.0–52.0)
Hemoglobin: 12.9 g/dL — ABNORMAL LOW (ref 13.0–17.0)
Potassium: 5.2 mmol/L — ABNORMAL HIGH (ref 3.5–5.1)
Sodium: 141 mmol/L (ref 135–145)
TCO2: 21 mmol/L — ABNORMAL LOW (ref 22–32)

## 2022-08-18 LAB — LACTIC ACID, PLASMA
Lactic Acid, Venous: 2.2 mmol/L (ref 0.5–1.9)
Lactic Acid, Venous: 1.2 mmol/L (ref 0.5–1.9)

## 2022-08-18 LAB — PHOSPHORUS: Phosphorus: 2.8 mg/dL (ref 2.5–4.6)

## 2022-08-18 LAB — TSH: TSH: 2.3 u[IU]/mL (ref 0.350–4.500)

## 2022-08-18 LAB — CK: Total CK: 100 U/L (ref 49–397)

## 2022-08-18 LAB — MAGNESIUM: Magnesium: 1.3 mg/dL — ABNORMAL LOW (ref 1.7–2.4)

## 2022-08-18 MED ORDER — SODIUM CHLORIDE 0.9 % IV SOLN
3.0000 g | Freq: Once | INTRAVENOUS | Status: AC
  Administered 2022-08-18: 3 g via INTRAVENOUS
  Filled 2022-08-18: qty 8

## 2022-08-18 MED ORDER — ACETAMINOPHEN 325 MG PO TABS: 650.0000 mg | ORAL_TABLET | Freq: Four times a day (QID) | ORAL | Status: DC | PRN

## 2022-08-18 MED ORDER — TAMSULOSIN HCL 0.4 MG PO CAPS
0.4000 mg | ORAL_CAPSULE | Freq: Every day | ORAL | Status: DC
  Administered 2022-08-18 – 2022-08-22 (×5): 0.4 mg via ORAL
  Filled 2022-08-18 (×5): qty 1

## 2022-08-18 MED ORDER — DORZOLAMIDE HCL-TIMOLOL MAL 2-0.5 % OP SOLN
1.0000 [drp] | Freq: Two times a day (BID) | OPHTHALMIC | Status: DC
  Administered 2022-08-19 – 2022-08-22 (×6): 1 [drp] via OPHTHALMIC
  Filled 2022-08-18: qty 10

## 2022-08-18 MED ORDER — FENTANYL CITRATE PF 50 MCG/ML IJ SOSY: 12.5000 ug | PREFILLED_SYRINGE | INTRAMUSCULAR | Status: DC | PRN

## 2022-08-18 MED ORDER — DEXAMETHASONE SODIUM PHOSPHATE 10 MG/ML IJ SOLN
10.0000 mg | Freq: Once | INTRAMUSCULAR | Status: AC
  Administered 2022-08-18: 10 mg via INTRAVENOUS
  Filled 2022-08-18: qty 1

## 2022-08-18 MED ORDER — VANCOMYCIN HCL IN DEXTROSE 1-5 GM/200ML-% IV SOLN: 1000.0000 mg | Freq: Once | INTRAVENOUS | Status: DC

## 2022-08-18 MED ORDER — VANCOMYCIN HCL 2000 MG/400ML IV SOLN: 2000.0000 mg | INTRAVENOUS | Status: DC

## 2022-08-18 MED ORDER — SODIUM CHLORIDE 0.9 % IV SOLN: INTRAVENOUS | Status: AC

## 2022-08-18 MED ORDER — ATORVASTATIN CALCIUM 80 MG PO TABS
80.0000 mg | ORAL_TABLET | Freq: Every day | ORAL | Status: DC
  Administered 2022-08-18 – 2022-08-22 (×5): 80 mg via ORAL
  Filled 2022-08-18 (×5): qty 1

## 2022-08-18 MED ORDER — SODIUM CHLORIDE 0.9 % IV SOLN
3.0000 g | Freq: Four times a day (QID) | INTRAVENOUS | Status: DC
  Administered 2022-08-18 – 2022-08-22 (×14): 3 g via INTRAVENOUS
  Filled 2022-08-18 (×14): qty 8

## 2022-08-18 MED ORDER — ACETAMINOPHEN 650 MG RE SUPP: 650.0000 mg | Freq: Four times a day (QID) | RECTAL | Status: DC | PRN

## 2022-08-18 MED ORDER — VANCOMYCIN HCL IN DEXTROSE 1-5 GM/200ML-% IV SOLN
1000.0000 mg | Freq: Once | INTRAVENOUS | Status: AC
  Administered 2022-08-18: 1000 mg via INTRAVENOUS
  Filled 2022-08-18: qty 200

## 2022-08-18 MED ORDER — METOPROLOL SUCCINATE ER 100 MG PO TB24
100.0000 mg | ORAL_TABLET | Freq: Every day | ORAL | Status: DC
  Administered 2022-08-19 – 2022-08-22 (×4): 100 mg via ORAL
  Filled 2022-08-18 (×4): qty 1

## 2022-08-18 MED ORDER — LATANOPROST 0.005 % OP SOLN
1.0000 [drp] | Freq: Every day | OPHTHALMIC | Status: DC
  Administered 2022-08-20 – 2022-08-22 (×3): 1 [drp] via OPHTHALMIC
  Filled 2022-08-18: qty 2.5

## 2022-08-18 NOTE — Progress Notes (Addendum)
Pharmacy Antibiotic Note  Marcus [name] is a 81 y.o. male admitted on 08/18/2022 with wound infection.  Pharmacy has been consulted for Vancomycin and Unasyn dosing.  Patient in AKI with Scr 2.4 - appears Scr was 1.3-1.5 over the last few years. CrCl 30-40 mL/min  Obtained weight of 150kg after discussion with patient. Previously, weight was 126kg in November. Will follow for more accurate weight to adjust doses as needed  Plan: Vancomcyin 2079m Q48H (eAUC 464, Scr 2.4, Vd 0.5) Unasyn 3g Q6H Monitor renal function for dose adjustments F/u weight to adjust dose/frequency as needed     Temp (24hrs), Avg:97.8 F (36.6 C), Min:97.6 F (36.4 C), Max:98 F (36.7 C)  Recent Labs  Lab 08/18/22 1307 08/18/22 1349 08/18/22 1415  WBC 16.0*  --   --   CREATININE 2.22*  --  2.40*  LATICACIDVEN  --  2.2*  --     CrCl cannot be calculated (Unknown ideal weight.).    Allergies  Allergen Reactions   Pirfenidone Rash   Ofev [Nintedanib] Diarrhea    Antimicrobials this admission: Vancomycin 12/7 >> Unasyn 12/7 >>  Microbiology results: Pending  Thank you for allowing pharmacy to be a part of this patient's care.  LMerrilee Jansky PharmD Clinical Pharmacist 08/18/2022 5:04 PM

## 2022-08-18 NOTE — ED Triage Notes (Signed)
Patient called and instructed to go to ED after a biopsy on his neck for concern "unlikely but possible parotid abscess after biopsy with interventional radiology". Patient is afebrile, alert, oriented, and in no apparent distress at this time.

## 2022-08-18 NOTE — Assessment & Plan Note (Signed)
Chronic stable not on o2

## 2022-08-18 NOTE — Assessment & Plan Note (Addendum)
ABX vanc and Unasyn  ENT to follow,   Requested adding decadron 1 dose and warm compresses IR consult for AM NPO post midnight

## 2022-08-18 NOTE — ED Notes (Signed)
RN requested urine from patient and was informed patient was unable to at that time.

## 2022-08-18 NOTE — Assessment & Plan Note (Signed)
-  currently appears to be slightly on the dry side,  carefuly follow fluid status and Cr

## 2022-08-18 NOTE — ED Provider Triage Note (Signed)
Emergency Medicine Provider Triage Evaluation Note  Marcus [name] , a 81 y.o. male  was evaluated in triage.  Pt complains of left neck swelling.  He apparently had a mass noted on his left parotid gland a couple weeks ago and ended up having a parotid biopsy about 2 weeks ago.  This was reportedly negative for any cancer, but he has had resulting severe neck swelling since then affecting his ability to eat and chew.  He says that the swelling is actually went down since then, but he saw his doctor today who tried to drain it and got some pus out.  He was then sent to the emergency department for imaging to evaluate for possible abscess..  Review of Systems  Positive:  Negative:   Physical Exam  BP 131/63   Pulse 77   Temp 97.6 F (36.4 C) (Oral)   Resp 16   SpO2 100%  Gen:   Awake, no distress   Resp:  Normal effort  MSK:   Moves extremities without difficulty  Other:  Throat is not erythematous, no tonsillar swelling or exudate, no peritonsillar abscess.  There is some significant left parotid gland swelling that is not fluctuant and is tender to touch.  There is no sublingual swelling or tenderness.  No sign of Ludwig's angina.  Airway is intact.  Medical Decision Making  Medically screening exam initiated at 1:22 PM.  Appropriate orders placed.  [name] was informed that the remainder of the evaluation will be completed by another provider, this initial triage assessment does not replace that evaluation, and the importance of remaining in the ED until their evaluation is complete.     Adolphus Birchwood, PA-C 08/18/22 1323

## 2022-08-18 NOTE — Consult Note (Signed)
ENT CONSULT:  Reason for Consult: Suppurative parotitis   Referring Physician: Elnora Morrison MD  HPI: Marcus [name] is an 81 y.o. male history of left parotid mass PPD#10 s/p US Guided FNA who presented to my office earlier today with several day hx of facial pain, swelling, drainage, and light-headedness. In office massage and needle aspiration with several CC purulent drainage s/p washout and packing. Sent to ED for IV abx and imaging. CT non-contrast performed at time of consultation inconclusive for abscess. WBC elevated at 16 with left shift.    Past Medical History:  Diagnosis Date   Ascending aorta dilatation ([place])    66m gy echo 01/2020   Benign essential HTN 09/09/2013   Coronary artery disease 08/2013   S/P PCI of the LAD   Dyslipidemia    Elevated fasting glucose    Fatty liver    Glaucoma    Gout    Hyperlipidemia    Hypertension    Joint pain    LAD (lymphadenopathy)    Obesity    Pure hypercholesterolemia 09/09/2013   PVD (peripheral vascular disease) [place] 06/12/2018     30-49% left common femoral stenosis   Vision abnormalities     Past Surgical History:  Procedure Laterality Date   ADENOIDECTOMY     CATARACT EXTRACTION     CORONARY ANGIOPLASTY WITH STENT PLACEMENT  2009   EYE SURGERY     TONSILLECTOMY     WISDOM TOOTH EXTRACTION      Family History  Problem Relation Age of Onset   Breast cancer Mother    Heart attack Father    Rheumatic fever Father    CAD Sister    Multiple sclerosis Sister    CAD Brother    Neuropathy Neg Hx     Social History:  reports that he quit smoking about 2 years ago. His smoking use included cigars. He has never used smokeless tobacco. He reports current alcohol use of about 1.0 standard drink of alcohol per week. He reports that he does not use drugs.  Allergies:  Allergies  Allergen Reactions   Pirfenidone Rash   Ofev [Nintedanib] Diarrhea    Medications: I have reviewed the patient's current  medications.  Results for orders placed or performed during the hospital encounter of 08/18/22 (from the past 48 hour(s))  Comprehensive metabolic panel     Status: Abnormal   Collection Time: 08/18/22  1:07 PM  Result Value Ref Range   Sodium 140 135 - 145 mmol/L   Potassium 5.1 3.5 - 5.1 mmol/L   Chloride 112 (H) 98 - 111 mmol/L   CO2 20 (L) 22 - 32 mmol/L   Glucose, Bld 127 (H) 70 - 99 mg/dL    Comment: Glucose reference range applies only to samples taken after fasting for at least 8 hours.   BUN 38 (H) 8 - 23 mg/dL   Creatinine, Ser 2.22 (H) 0.61 - 1.24 mg/dL   Calcium 10.0 8.9 - 10.3 mg/dL   Total Protein 7.1 6.5 - 8.1 g/dL   Albumin 2.7 (L) 3.5 - 5.0 g/dL   AST 41 15 - 41 U/L   ALT 53 (H) 0 - 44 U/L   Alkaline Phosphatase 64 38 - 126 U/L   Total Bilirubin 0.5 0.3 - 1.2 mg/dL   GFR, Estimated 29 (L) >60 mL/min    Comment: (NOTE) Calculated using the CKD-EPI Creatinine Equation (2021)    Anion gap 8 5 - 15    Comment: Performed at [facility], [place] [address], [place], [place] [address]  CBC with Differential     Status: Abnormal   Collection Time: 08/18/22  1:07 PM  Result Value Ref Range   WBC 16.0 (H) 4.0 - 10.5 K/uL   RBC 3.79 (L) 4.22 - 5.81 MIL/uL   Hemoglobin 12.7 (L) 13.0 - 17.0 g/dL   HCT 38.4 (L) 39.0 - 52.0 %   MCV 101.3 (H) 80.0 - 100.0 fL   MCH 33.5 26.0 - 34.0 pg   MCHC 33.1 30.0 - 36.0 g/dL   RDW 14.3 11.5 - 15.5 %   Platelets 263 150 - 400 K/uL   nRBC 0.0 0.0 - 0.2 %   Neutrophils Relative % 85 %   Neutro Abs 13.6 (H) 1.7 - 7.7 K/uL   Lymphocytes Relative 5 %   Lymphs Abs 0.8 0.7 - 4.0 K/uL   Monocytes Relative 7 %   Monocytes Absolute 1.1 (H) 0.1 - 1.0 K/uL   Eosinophils Relative 2 %   Eosinophils Absolute 0.3 0.0 - 0.5 K/uL   Basophils Relative 0 %   Basophils Absolute 0.1 0.0 - 0.1 K/uL   Immature Granulocytes 1 %   Abs Immature Granulocytes 0.14 (H) 0.00 - 0.07 K/uL    Comment: Performed at [facility], [address]  [place], [place] [address]  Lactic acid, plasma     Status: Abnormal   Collection Time: 08/18/22  1:49 PM  Result Value Ref Range   Lactic Acid, Venous 2.2 (HH) 0.5 - 1.9 mmol/L    Comment: CRITICAL RESULT CALLED TO, READ BACK BY AND VERIFIED WITH KOHUT N, RN _0  08/18/22. [facility] Performed at [facility], [place] [address], [place],  [address]   I-stat chem 8, ED     Status: Abnormal   Collection Time: 08/18/22  2:15 PM  Result Value Ref Range   Sodium 141 135 - 145 mmol/L   Potassium 5.2 (H) 3.5 - 5.1 mmol/L   Chloride 112 (H) 98 - 111 mmol/L   BUN 41 (H) 8 - 23 mg/dL   Creatinine, Ser 2.40 (H) 0.61 - 1.24 mg/dL   Glucose, Bld 123 (H) 70 - 99 mg/dL    Comment: Glucose reference range applies only to samples taken after fasting for at least 8 hours.   Calcium, Ion 1.35 1.15 - 1.40 mmol/L   TCO2 21 (L) 22 - 32 mmol/L   Hemoglobin 12.9 (L) 13.0 - 17.0 g/dL   HCT 38.0 (L) 39.0 - 52.0 %    CT Soft Tissue Neck Wo Contrast  Result Date: 08/18/2022 CLINICAL DATA:  Soft tissue infection suspected after left parotid biopsy. EXAM: CT NECK WITHOUT CONTRAST TECHNIQUE: Multidetector CT imaging of the neck was performed following the standard protocol without intravenous contrast. RADIATION DOSE REDUCTION: This exam was performed according to the departmental dose-optimization program which includes automated exposure control, adjustment of the mA and/or kV according to patient size and/or use of iterative reconstruction technique. COMPARISON:  CT Face 06/09/22 FINDINGS: Pharynx and larynx: Normal. No mass or swelling. Salivary glands: Redemonstrated left sided intraparotid soft tissue. there is asymmetric soft tissue surrounding the left parotid gland. The right parotid gland is not visualized, possibly surgically absent. The appearance of the submandibular glands is unchanged compared to prior exam. Thyroid: Normal. Lymph nodes: None enlarged or abnormal density. Vascular: Negative. Limited  intracranial: Negative. Visualized orbits: Negative. Mastoids and visualized paranasal sinuses: Clear. Skeleton: No acute or aggressive process. Upper chest: Negative. Other:  There is a biopsy tract along the posterolateral left neck (series 3, image 63). There is asymmetric skin thickening and subcutaneous soft tissue stranding in the left neck with asymmetric thickening and stranding of the left platysma and sternocleidomastoid muscle bodies (series 3, image 78,79). IMPRESSION: Asymmetric soft tissue stranding surrounding the left parotid gland with associated left neck skin thickening and subcutaneous soft tissue stranding along the left platysma and sternocleidomastoid muscle body. Findings are favored to be infectious in nature. The lack of IV contrast precludes assessment for an abscess. Electronically Signed   By: Marin Roberts M.D.   On: 08/18/2022 16:37    JOI:TGPQDIYM other than stated per HPI  Blood pressure (!) 143/71, pulse 73, temperature 98 F (36.7 C), temperature source Oral, resp. rate 20, SpO2 100 %.  PHYSICAL EXAM:  CONSTITUTIONAL: well developed, nourished, no distress and alert and oriented x 3 EYES: PERRL, EOMI . Right upper eyelid ptosis.  HENT: Head : normocephalic and atraumatic Ears: Right ear:   canal normal, external ear normal and hearing normal Left ear:   canal normal, external ear normal and hearing normal Nose: nose normal and no purulence Mouth/Throat:  Mouth: uvula midline and no oral lesions Throat: oropharynx clear and moist NECK: Significant left facial/parotid edema, warm to touch, tender. Post-auricular strip gauze packing/band-aid in place. No purulent drainage.  NEURO: CN II-XII symmetric intact   Studies Reviewed:CT neck without contrast  IMPRESSION: Asymmetric soft tissue stranding surrounding the left parotid gland with associated left neck skin thickening and subcutaneous soft tissue stranding along the left platysma and  sternocleidomastoid muscle body. Findings are favored to be infectious in nature. The lack of IV contrast precludes assessment for an abscess.     Electronically Signed   By: Marin Roberts M.D.   On: 08/18/2022 16:37  Assessment/Plan: #Left Suppurative parotitis with possible abscess PPD#10 s/p US Guided needle aspiration for left Warthin's tumor. S/p attempted needle aspiration of presumed abscess in the office today. Sent to ED for IV abx and imaging to r/o abscess. CT scan unable to r/o abscess due to lack of contrast  - Urgent US Neck to evaluate for abscess; if present STAT IR Consultation for US guided drainage  - IV Abx - Warm compresses 20 minutes on Q2 hours while awake - Consider decadron 10 mg IV q8 x 3 doses if no medical contraindications (history of glaucoma may pose an issue for steroids) - Medicine admission and consider ID consultation to assist in abx (Outpatient wound culture taken in the office today 08/18/22) - I will follow this patient and change packing on AM rounds   I have personally spent 35 minutes involved in face-to-face and non-face-to-face activities for this patient on the day of the visit.  Professional time spent includes the following activities, in addition to those noted in the documentation: preparing to see the patient (eg, review of tests), obtaining and/or reviewing separately obtained history, performing a medically appropriate examination and/or evaluation, counseling and educating the patient/family/caregiver, ordering medications, tests or procedures, referring and communicating with other healthcare professionals, documenting clinical information in the electronic or other health record, independently interpreting results and communicating results with the patient/family/caregiver, care coordination.  Electronically signed by:  Jenetta Downer, MD  Staff Physician Facial Plastic & Reconstructive Surgery Otolaryngology - Head and Neck  [facility]   08/18/2022, 5:20 PM

## 2022-08-18 NOTE — ED Provider Notes (Signed)
[facility] EMERGENCY DEPARTMENT Provider Note   CSN: [id number] Arrival date & time: 08/18/22  1152     History  Chief Complaint  Patient presents with   Wound Check    Marcus [name] is a 81 y.o. male.  Patient presents for further imaging and evaluation for left facial swelling.  Patient was sent over by Dr. Sabino Gasser.  Patient had biopsy done by ear nose and throat and since the day afterwards has had persistent worsening swelling and pain with referred pain to the left ear.  No current fevers chills or vomiting.  Patient been on oral antibiotics without improvement.  Patient denies shortness of breath, chest pain or other signs.       Home Medications Prior to Admission medications   Medication Sig Start Date End Date Taking? Authorizing Provider  allopurinol (ZYLOPRIM) 300 MG tablet Take 300 mg by mouth daily.    [provider]  aspirin 81 MG tablet Take 81 mg by mouth daily.    [provider]  atorvastatin (LIPITOR) 80 MG tablet Take 1 tablet (80 mg total) by mouth daily. 07/27/22   Sueanne Margarita, MD  Cyanocobalamin (VITAMIN B-12 PO) Take 1 capsule by mouth daily.    [provider]  dorzolamide-timolol (COSOPT) 22.3-6.8 MG/ML ophthalmic solution Place 1 drop into the left eye 2 (two) times daily. 05/02/16   [provider]  ferrous sulfate 325 (65 FE) MG EC tablet Take 325 mg by mouth 3 (three) times daily with meals.    [provider]  fluticasone (FLONASE) 50 MCG/ACT nasal spray Place 2 sprays into both nostrils as needed.    [provider]  latanoprost (XALATAN) 0.005 % ophthalmic solution Place 1 drop into the left eye daily. 01/27/16   [provider]  losartan (COZAAR) 100 MG tablet Take 1 tablet (100 mg total) by mouth daily. 07/27/22   Sueanne Margarita, MD  metoprolol succinate (TOPROL-XL) 100 MG 24 hr tablet Take with or immediately following a meal. 07/27/22   Turner, Eber Hong, MD   minocycline (MINOCIN,DYNACIN) 100 MG capsule Take 100 mg by mouth as needed. For skin acne 10/07/14   [provider]  Netarsudil-Latanoprost (ROCKLATAN) 0.02-0.005 % SOLN As directed in left eye    [provider]  nitroGLYCERIN (NITROSTAT) 0.4 MG SL tablet Place 0.4 mg under the tongue every 5 (five) minutes as needed for chest pain.     [provider]  Omega-3 Fatty Acids (OMEGA 3 PO) Take 2 capsules by mouth in the morning and at bedtime. 2000 mg BID    [provider]  pimecrolimus (ELIDEL) 1 % cream APPLY TO AFFECTED AREA ON FACE TWICE A DAY AS NEEDED 01/11/19   [provider]  tamsulosin (FLOMAX) 0.4 MG CAPS capsule Take 0.4 mg by mouth daily. 04/27/20   [provider]      Allergies    Pirfenidone and Ofev [nintedanib]    Review of Systems   Review of Systems  Constitutional:  Negative for chills and fever.  HENT:  Positive for facial swelling. Negative for congestion.   Eyes:  Negative for visual disturbance.  Respiratory:  Negative for shortness of breath.   Cardiovascular:  Negative for chest pain.  Gastrointestinal:  Negative for abdominal pain and vomiting.  Genitourinary:  Negative for dysuria and flank pain.  Musculoskeletal:  Negative for back pain, neck pain and neck stiffness.  Skin:  Positive for wound. Negative for rash.  Neurological:  Negative for light-headedness and headaches.    Physical Exam Updated Vital Signs BP (!) 143/71   Pulse 73   Temp 98 F (36.7 C) (Oral)   Resp 20   Ht _0  (1.905 m)   Wt (!) 150 kg   SpO2 100%   BMI 41.33 kg/m  Physical Exam Vitals and nursing note reviewed.  Constitutional:      General: He is not in acute distress.    Appearance: He is well-developed.  HENT:     Head: Normocephalic.     Comments: Patient has moderate swelling left posterior lower face near the angle of the mandible with mild induration and firmness.  Mild tender.    Mouth/Throat:     Mouth:  Mucous membranes are moist.  Eyes:     General:        Right eye: No discharge.        Left eye: No discharge.     Conjunctiva/sclera: Conjunctivae normal.  Neck:     Trachea: No tracheal deviation.  Cardiovascular:     Rate and Rhythm: Normal rate.  Pulmonary:     Effort: Pulmonary effort is normal.  Abdominal:     General: There is no distension.     Palpations: Abdomen is soft.     Tenderness: There is no abdominal tenderness. There is no guarding.  Musculoskeletal:     Cervical back: Normal range of motion and neck supple. No rigidity.  Skin:    General: Skin is warm.     Capillary Refill: Capillary refill takes less than 2 seconds.     Findings: No rash.  Neurological:     General: No focal deficit present.     Mental Status: He is alert.     Cranial Nerves: No cranial nerve deficit.  Psychiatric:        Mood and Affect: Mood normal.     ED Results / Procedures / Treatments   Labs (all labs ordered are listed, but only abnormal results are displayed) Labs Reviewed  COMPREHENSIVE METABOLIC PANEL - Abnormal; Notable for the following components:      Result Value   Chloride 112 (*)    CO2 20 (*)    Glucose, Bld 127 (*)    BUN 38 (*)    Creatinine, Ser 2.22 (*)    Albumin 2.7 (*)    ALT 53 (*)    GFR, Estimated 29 (*)    All other components within normal limits  CBC WITH DIFFERENTIAL/PLATELET - Abnormal; Notable for the following components:   WBC 16.0 (*)    RBC 3.79 (*)    Hemoglobin 12.7 (*)    HCT 38.4 (*)    MCV 101.3 (*)    Neutro Abs 13.6 (*)    Monocytes Absolute 1.1 (*)    Abs Immature Granulocytes 0.14 (*)    All other components within normal limits  LACTIC ACID, PLASMA - Abnormal; Notable for the following components:   Lactic Acid, Venous 2.2 (*)    All other components within normal limits  I-STAT CHEM 8, ED - Abnormal; Notable for the following components:   Potassium 5.2 (*)    Chloride 112 (*)    BUN 41 (*)    Creatinine, Ser 2.40 (*)     Glucose, Bld 123 (*)    TCO2 21 (*)    Hemoglobin 12.9 (*)    HCT 38.0 (*)    All other components within normal limits  LACTIC ACID, PLASMA  EKG None  Radiology CT Soft Tissue Neck Wo Contrast  Result Date: 08/18/2022 CLINICAL DATA:  Soft tissue infection suspected after left parotid biopsy. EXAM: CT NECK WITHOUT CONTRAST TECHNIQUE: Multidetector CT imaging of the neck was performed following the standard protocol without intravenous contrast. RADIATION DOSE REDUCTION: This exam was performed according to the departmental dose-optimization program which includes automated exposure control, adjustment of the mA and/or kV according to patient size and/or use of iterative reconstruction technique. COMPARISON:  CT Face 06/09/22 FINDINGS: Pharynx and larynx: Normal. No mass or swelling. Salivary glands: Redemonstrated left sided intraparotid soft tissue. there is asymmetric soft tissue surrounding the left parotid gland. The right parotid gland is not visualized, possibly surgically absent. The appearance of the submandibular glands is unchanged compared to prior exam. Thyroid: Normal. Lymph nodes: None enlarged or abnormal density. Vascular: Negative. Limited intracranial: Negative. Visualized orbits: Negative. Mastoids and visualized paranasal sinuses: Clear. Skeleton: No acute or aggressive process. Upper chest: Negative. Other: There is a biopsy tract along the posterolateral left neck (series 3, image 63). There is asymmetric skin thickening and subcutaneous soft tissue stranding in the left neck with asymmetric thickening and stranding of the left platysma and sternocleidomastoid muscle bodies (series 3, image 78,79). IMPRESSION: Asymmetric soft tissue stranding surrounding the left parotid gland with associated left neck skin thickening and subcutaneous soft tissue stranding along the left platysma and sternocleidomastoid muscle body. Findings are favored to be infectious in nature. The lack of  IV contrast precludes assessment for an abscess. Electronically Signed   By: Marin Roberts M.D.   On: 08/18/2022 16:37    Procedures .Critical Care  Performed by: Elnora Morrison, MD Authorized by: Elnora Morrison, MD   Critical care provider statement:    Critical care time (minutes):  30   Critical care start time:  08/18/2022 5:40 PM   Critical care end time:  08/18/2022 6:10 PM   Critical care time was exclusive of:  Separately billable procedures and treating other patients and teaching time   Critical care was time spent personally by me on the following activities:  Development of treatment plan with patient or surrogate, discussions with consultants, evaluation of patient's response to treatment, examination of patient, ordering and review of laboratory studies, ordering and review of radiographic studies, ordering and performing treatments and interventions, re-evaluation of patient's condition and review of old charts     Medications Ordered in ED Medications  Ampicillin-Sulbactam (UNASYN) 3 g in sodium chloride 0.9 % 100 mL IVPB (has no administration in time range)  vancomycin (VANCOCIN) IVPB 1000 mg/200 mL premix (has no administration in time range)    Followed by  vancomycin (VANCOCIN) IVPB 1000 mg/200 mL premix (has no administration in time range)  dexamethasone (DECADRON) injection 10 mg (has no administration in time range)  vancomycin (VANCOREADY) IVPB 2000 mg/400 mL (has no administration in time range)    ED Course/ Medical Decision Making/ A&P Clinical Course as of 08/18/22 1810  Thu Aug 18, 2022  1551 Changed CT to without contrast due to renal function [GL]    Clinical Course User Index [GL] Adolphus Birchwood, PA-C                           Medical Decision Making Amount and/or Complexity of Data Reviewed Labs: ordered. Radiology: ordered.  Risk Prescription drug management. Decision regarding hospitalization.   Patient presents with persistent  swelling left face since biopsy clinical concern for abscess.  Discussed with patient's ENT Dr. Sabino Gasser in terms of imaging and IV antibiotics.  IV antibiotics ordered, CT scan ordered however radiology unable to do with contrast due to kidney function which was reviewed and elevated 2.2 from 1.6. Ultrasound showed also help with further delineation.  Blood work ordered and independently reviewed showing white count of 16,000, creatinine 2.2, hemoglobin 12.  And nose and throat evaluated in the ER.  Paged hospitalist for admission.  Clinical concern for suppurative parotitis, other differentials include facial abscess, cellulitis, mass, other.          Final Clinical Impression(s) / ED Diagnoses Final diagnoses:  Acute suppurative parotitis    Rx / DC Orders ED Discharge Orders     None         Elnora Morrison, MD 08/18/22 [phone]

## 2022-08-18 NOTE — Assessment & Plan Note (Signed)
Chronic stable ?

## 2022-08-18 NOTE — Subjective & Objective (Signed)
On 11/27 pt had a biopsy of LEFT parotid mixed cystic and solid lesion done next day he started to have swelling and pain started on abx He was told he has possible  parotid abscess and was sent to ER No fever no chills

## 2022-08-18 NOTE — Assessment & Plan Note (Signed)
Chornic stable cont Lipitor 80 mg po Qday Restart toprol when BP allows

## 2022-08-18 NOTE — Assessment & Plan Note (Signed)
-  chronic avoid nephrotoxic medications such as NSAIDs, Vanco Zosyn combo,  avoid hypotension, continue to follow renal function  

## 2022-08-18 NOTE — ED Notes (Signed)
Patient given half a cup of ice chips.

## 2022-08-18 NOTE — Assessment & Plan Note (Signed)
Rehydrate and recheck

## 2022-08-18 NOTE — ED Notes (Addendum)
PT returns from CT. Pt AxOx4, significant swelling noted to left side of neck. Airway is patent.

## 2022-08-18 NOTE — Assessment & Plan Note (Signed)
Obtain urine lytes, rehydrate and follow

## 2022-08-18 NOTE — H&P (Signed)
Marcus [name]:[id number] DOB: 28-Jan-1941 DOA: 08/18/2022     PCP: London Pepper, MD   Outpatient Specialists:   CARDS:   Dr. Radford Pax   Pulmonary .Dr. Chase Caller with pulmonary     Patient arrived to ER on 08/18/22 at 1152 Referred by Attending Elnora Morrison, MD   Patient coming from:    home Lives  With family    Chief Complaint:   Chief Complaint  Patient presents with   Wound Check    HPI: Marcus [name] is a 81 y.o. male with medical history significant of CAD, HTN, HLD, OSA, ILD, CKD 3a, chronic diastolic CHF    Presented with   left neck swelling  On 11/27 pt had a biopsy of LEFT parotid mixed cystic and solid lesion done next day he started to have swelling and pain started on abx He was told he has possible  parotid abscess and was sent to ER No fever no chills No longer smokes, drinks 1-2 glasses of wine per day     Regarding pertinent Chronic problems:     Hyperlipidemia -  on statins Lipitor (atorvastatin)  Lipid Panel     Component Value Date/Time   CHOL 115 08/17/2021 0916   CHOL 105 08/01/2014 0835   TRIG 186 (H) 08/17/2021 0916   TRIG 182 (H) 08/01/2014 0835   HDL 33 (L) 08/17/2021 0916   HDL 26 (L) 08/01/2014 0835   CHOLHDL 3.5 08/17/2021 0916   CHOLHDL 3.7 04/18/2016 0922   VLDL 41 (H) 04/18/2016 0922   LDLCALC 51 08/17/2021 0916   LDLCALC 43 08/01/2014 0835   LABVLDL 31 08/17/2021 0916     HTN on toprol, Cozaar    chronic CHF diastolic/ - last echo June 2952 grade 1 diastolic dysfunction     CAD  - On Aspirin, statin, betablocker,                  -  followed by cardiology S/P PCI of the LAD    Morbid obesity-   BMI Readings from Last 1 Encounters:  08/18/22 41.33 kg/m    ILD -  followed by pulmonology   not  on baseline oxygen      OSA - noncompliant with CPAP  CKD stage IIIa- baseline Cr 1.6 Estimated Creatinine Clearance: 38.4 mL/min (A) (by C-G formula based on SCr of 2.4 mg/dL (H)).  Lab Results  Component Value  Date   CREATININE 2.40 (H) 08/18/2022   CREATININE 2.22 (H) 08/18/2022   CREATININE 1.56 (H) 07/27/2022    Chronic anemia - baseline hg Hemoglobin & Hematocrit  Recent Labs    09/27/21 0923 08/18/22 1307 08/18/22 1415  HGB 13.0 12.7* 12.9*   PAD - -LE arterial dopplers 20017 showed 30-49% left common femoral stenosis.   -He has not had any claudication symptoms On statin  While in ER: Clinical Course as of 08/18/22 1854  Thu Aug 18, 2022  1551 Changed CT to without contrast due to renal function [GL]    Clinical Course User Index [GL] Loeffler, Adora Fridge, PA-C    US soft tissue - 2.1 x 1.2 x 2.0 cm well-circumscribed nonvascular low-attenuation/fluid collection in the region of the left parotid gland. This may represent a parotid cyst or abscess.  CT neck Asymmetric soft tissue stranding surrounding the left parotid gland with associated left neck skin thickening and subcutaneous soft tissue stranding along the left platysma and sternocleidomastoid muscle body. Findings are favored to be infectious in nature  Following  Medications were ordered in ER: Medications  Ampicillin-Sulbactam (UNASYN) 3 g in sodium chloride 0.9 % 100 mL IVPB (3 g Intravenous New Bag/Given 08/18/22 1831)  vancomycin (VANCOCIN) IVPB 1000 mg/200 mL premix (has no administration in time range)    Followed by  vancomycin (VANCOCIN) IVPB 1000 mg/200 mL premix (has no administration in time range)  dexamethasone (DECADRON) injection 10 mg (has no administration in time range)  vancomycin (VANCOREADY) IVPB 2000 mg/400 mL (has no administration in time range)    _______________________________________________________ ER Provider Called:   ENT  Dr. Sabino Gasser They Recommend admit to medicine  on ABX and decadron  SEEN in ER   ED Triage Vitals  Enc Vitals Group     BP 08/18/22 1302 131/63     Pulse Rate 08/18/22 1302 77     Resp 08/18/22 1302 16     Temp 08/18/22 1302 97.6 F (36.4 C)     Temp Source  08/18/22 1302 Oral     SpO2 08/18/22 1302 100 %     Weight 08/18/22 1700 (!) 330 lb 11 oz (150 kg)     Height 08/18/22 1700 _0  (1.905 m)     Head Circumference --      Peak Flow --      Pain Score 08/18/22 1311 0     Pain Loc --      Pain Edu? --      Excl. in [place]? --   TMAX(24)@     _________________________________________ Significant initial  Findings: Abnormal Labs Reviewed  COMPREHENSIVE METABOLIC PANEL - Abnormal; Notable for the following components:      Result Value   Chloride 112 (*)    CO2 20 (*)    Glucose, Bld 127 (*)    BUN 38 (*)    Creatinine, Ser 2.22 (*)    Albumin 2.7 (*)    ALT 53 (*)    GFR, Estimated 29 (*)    All other components within normal limits  CBC WITH DIFFERENTIAL/PLATELET - Abnormal; Notable for the following components:   WBC 16.0 (*)    RBC 3.79 (*)    Hemoglobin 12.7 (*)    HCT 38.4 (*)    MCV 101.3 (*)    Neutro Abs 13.6 (*)    Monocytes Absolute 1.1 (*)    Abs Immature Granulocytes 0.14 (*)    All other components within normal limits  LACTIC ACID, PLASMA - Abnormal; Notable for the following components:   Lactic Acid, Venous 2.2 (*)    All other components within normal limits  I-STAT CHEM 8, ED - Abnormal; Notable for the following components:   Potassium 5.2 (*)    Chloride 112 (*)    BUN 41 (*)    Creatinine, Ser 2.40 (*)    Glucose, Bld 123 (*)    TCO2 21 (*)    Hemoglobin 12.9 (*)    HCT 38.0 (*)    All other components within normal limits    ECG: Ordered ______ This patient meets SIRS Criteria and may be septic.    The recent clinical data is shown below. Vitals:   08/18/22 1302 08/18/22 1602 08/18/22 1700  BP: 131/63 (!) 143/71   Pulse: 77 73   Resp: 16 20   Temp: 97.6 F (36.4 C) 98 F (36.7 C)   TempSrc: Oral Oral   SpO2: 100% 100%   Weight:   (!) 150 kg  Height:   _1  (1.905 m)     WBC  Component Value Date/Time   WBC 16.0 (H) 08/18/2022 1307   LYMPHSABS 0.8 08/18/2022 1307   MONOABS 1.1  (H) 08/18/2022 1307   EOSABS 0.3 08/18/2022 1307   BASOSABS 0.1 08/18/2022 1307    Lactic Acid, Venous    Component Value Date/Time   LATICACIDVEN 2.2 (HH) 08/18/2022 1349    Procalcitonin   Ordered     UA  ordered    _______________________________________________ Hospitalist was called for admission for   Acute suppurative parotitis    The following Work up has been ordered so far:  Orders Placed This Encounter  Procedures   Critical Care   CT Soft Tissue Neck Wo Contrast   US SOFT TISSUE HEAD & NECK (NON-THYROID)   Comprehensive metabolic panel   CBC with Differential   Lactic acid, plasma   Nursing Communication Warm compresses every 20 to 30 min on face   vancomycin per pharmacy consult   Consult to hospitalist   I-stat chem 8, ED   Insert peripheral IV     OTHER Significant initial  Findings:  labs showing:    Recent Labs  Lab 08/18/22 1307 08/18/22 1415  NA 140 141  K 5.1 5.2*  CO2 20*  --   GLUCOSE 127* 123*  BUN 38* 41*  CREATININE 2.22* 2.40*  CALCIUM 10.0  --     Cr Up from baseline see below Lab Results  Component Value Date   CREATININE 2.40 (H) 08/18/2022   CREATININE 2.22 (H) 08/18/2022   CREATININE 1.56 (H) 07/27/2022    Recent Labs  Lab 08/18/22 1307  AST 41  ALT 53*  ALKPHOS 64  BILITOT 0.5  PROT 7.1  ALBUMIN 2.7*   Lab Results  Component Value Date   CALCIUM 10.0 08/18/2022        Plt: Lab Results  Component Value Date   PLT 263 08/18/2022       Recent Labs  Lab 08/18/22 1307 08/18/22 1415  WBC 16.0*  --   NEUTROABS 13.6*  --   HGB 12.7* 12.9*  HCT 38.4* 38.0*  MCV 101.3*  --   PLT 263  --     HG/HCT   stable,     Component Value Date/Time   HGB 12.9 (L) 08/18/2022 1415   HGB 14.0 01/28/2020 1201   HCT 38.0 (L) 08/18/2022 1415   HCT 42.3 01/28/2020 1201   MCV 101.3 (H) 08/18/2022 1307   MCV 100 (H) 01/28/2020 1201       Radiological Exams on Admission: US SOFT TISSUE HEAD & NECK  (NON-THYROID)  Result Date: 08/18/2022 CLINICAL DATA:  Parotid swelling. EXAM: ULTRASOUND OF HEAD/NECK SOFT TISSUES TECHNIQUE: Ultrasound examination of the head and neck soft tissues was performed in the area of clinical concern. COMPARISON:  CT neck 08/18/2022 FINDINGS: There is a 2.1 x 1.2 x 2.0 cm well-circumscribed nonvascular low-attenuation/fluid collection in the region of the left parotid gland. There is no surrounding hypervascularity. No calcifications. IMPRESSION: 2.1 x 1.2 x 2.0 cm well-circumscribed nonvascular low-attenuation/fluid collection in the region of the left parotid gland. This may represent a parotid cyst or abscess. Electronically Signed   By: Ronney Asters M.D.   On: 08/18/2022 18:17   CT Soft Tissue Neck Wo Contrast  Result Date: 08/18/2022 CLINICAL DATA:  Soft tissue infection suspected after left parotid biopsy. EXAM: CT NECK WITHOUT CONTRAST TECHNIQUE: Multidetector CT imaging of the neck was performed following the standard protocol without intravenous contrast. RADIATION DOSE REDUCTION: This exam was performed according to  the departmental dose-optimization program which includes automated exposure control, adjustment of the mA and/or kV according to patient size and/or use of iterative reconstruction technique. COMPARISON:  CT Face 06/09/22 FINDINGS: Pharynx and larynx: Normal. No mass or swelling. Salivary glands: Redemonstrated left sided intraparotid soft tissue. there is asymmetric soft tissue surrounding the left parotid gland. The right parotid gland is not visualized, possibly surgically absent. The appearance of the submandibular glands is unchanged compared to prior exam. Thyroid: Normal. Lymph nodes: None enlarged or abnormal density. Vascular: Negative. Limited intracranial: Negative. Visualized orbits: Negative. Mastoids and visualized paranasal sinuses: Clear. Skeleton: No acute or aggressive process. Upper chest: Negative. Other: There is a biopsy tract along the  posterolateral left neck (series 3, image 63). There is asymmetric skin thickening and subcutaneous soft tissue stranding in the left neck with asymmetric thickening and stranding of the left platysma and sternocleidomastoid muscle bodies (series 3, image 78,79). IMPRESSION: Asymmetric soft tissue stranding surrounding the left parotid gland with associated left neck skin thickening and subcutaneous soft tissue stranding along the left platysma and sternocleidomastoid muscle body. Findings are favored to be infectious in nature. The lack of IV contrast precludes assessment for an abscess. Electronically Signed   By: Marin Roberts M.D.   On: 08/18/2022 16:37   _______________________________________________________________________________________________________ Latest  Blood pressure (!) 143/71, pulse 73, temperature 98 F (36.7 C), temperature source Oral, resp. rate 20, height _0  (1.905 m), weight (!) 150 kg, SpO2 100 %.   Vitals  labs and radiology finding personally reviewed  Review of Systems:    Pertinent positives include:   fatigue neck swelling   Constitutional:  No weight loss, night sweats, Fevers, chills,e, weight loss  HEENT:  No headaches, Difficulty swallowing,Tooth/dental problems,Sore throat,  No sneezing, itching, ear ache, nasal congestion, post nasal drip,  Cardio-vascular:  No chest pain, Orthopnea, PND, anasarca, dizziness, palpitations.no Bilateral lower extremity swelling  GI:  No heartburn, indigestion, abdominal pain, nausea, vomiting, diarrhea, change in bowel habits, loss of appetite, melena, blood in stool, hematemesis Resp:  no shortness of breath at rest. No dyspnea on exertion, No excess mucus, no productive cough, No non-productive cough, No coughing up of blood.No change in color of mucus.No wheezing. Skin:  no rash or lesions. No jaundice GU:  no dysuria, change in color of urine, no urgency or frequency. No straining to urinate.  No flank pain.   Musculoskeletal:  No joint pain or no joint swelling. No decreased range of motion. No back pain.  Psych:  No change in mood or affect. No depression or anxiety. No memory loss.  Neuro: no localizing neurological complaints, no tingling, no weakness, no double vision, no gait abnormality, no slurred speech, no confusion  All systems reviewed and apart from [place] all are negative _______________________________________________________________________________________________ Past Medical History:   Past Medical History:  Diagnosis Date   Ascending aorta dilatation ([place])    90m gy echo 01/2020   Benign essential HTN 09/09/2013   Coronary artery disease 08/2013   S/P PCI of the LAD   Dyslipidemia    Elevated fasting glucose    Fatty liver    Glaucoma    Gout    Hyperlipidemia    Hypertension    Joint pain    LAD (lymphadenopathy)    Obesity    Pure hypercholesterolemia 09/09/2013   PVD (peripheral vascular disease) ([facility] 06/12/2018     30-49% left common femoral stenosis   Vision abnormalities      Past Surgical History:  Procedure Laterality Date   ADENOIDECTOMY     CATARACT EXTRACTION     CORONARY ANGIOPLASTY WITH STENT PLACEMENT  2009   EYE SURGERY     TONSILLECTOMY     WISDOM TOOTH EXTRACTION      Social History:  Ambulatory   independently      reports that he quit smoking about 2 years ago. His smoking use included cigars. He has never used smokeless tobacco. He reports current alcohol use of about 1.0 standard drink of alcohol per week. He reports that he does not use drugs.   Family History:  Family History  Problem Relation Age of Onset   Breast cancer Mother    Heart attack Father    Rheumatic fever Father    CAD Sister    Multiple sclerosis Sister    CAD Brother    Neuropathy Neg Hx    ______________________________________________________________________________________________ Allergies: Allergies  Allergen Reactions   Pirfenidone Rash   Ofev  [Nintedanib] Diarrhea     Prior to Admission medications   Medication Sig Start Date End Date Taking? Authorizing Provider  allopurinol (ZYLOPRIM) 300 MG tablet Take 300 mg by mouth daily.    [provider]  aspirin 81 MG tablet Take 81 mg by mouth daily.    [provider]  atorvastatin (LIPITOR) 80 MG tablet Take 1 tablet (80 mg total) by mouth daily. 07/27/22   Sueanne Margarita, MD  Cyanocobalamin (VITAMIN B-12 PO) Take 1 capsule by mouth daily.    [provider]  dorzolamide-timolol (COSOPT) 22.3-6.8 MG/ML ophthalmic solution Place 1 drop into the left eye 2 (two) times daily. 05/02/16   [provider]  ferrous sulfate 325 (65 FE) MG EC tablet Take 325 mg by mouth 3 (three) times daily with meals.    [provider]  fluticasone (FLONASE) 50 MCG/ACT nasal spray Place 2 sprays into both nostrils as needed.    [provider]  latanoprost (XALATAN) 0.005 % ophthalmic solution Place 1 drop into the left eye daily. 01/27/16   [provider]  losartan (COZAAR) 100 MG tablet Take 1 tablet (100 mg total) by mouth daily. 07/27/22   Sueanne Margarita, MD  metoprolol succinate (TOPROL-XL) 100 MG 24 hr tablet Take with or immediately following a meal. 07/27/22   Turner, Eber Hong, MD  minocycline (MINOCIN,DYNACIN) 100 MG capsule Take 100 mg by mouth as needed. For skin acne 10/07/14   [provider]  Netarsudil-Latanoprost (ROCKLATAN) 0.02-0.005 % SOLN As directed in left eye    [provider]  nitroGLYCERIN (NITROSTAT) 0.4 MG SL tablet Place 0.4 mg under the tongue every 5 (five) minutes as needed for chest pain.     [provider]  Omega-3 Fatty Acids (OMEGA 3 PO) Take 2 capsules by mouth in the morning and at bedtime. 2000 mg BID    [provider]  pimecrolimus (ELIDEL) 1 % cream APPLY TO AFFECTED AREA ON FACE TWICE A DAY AS NEEDED 01/11/19   [provider]  tamsulosin (FLOMAX) 0.4 MG CAPS  capsule Take 0.4 mg by mouth daily. 04/27/20   [provider]    ___________________________________________________________________________________________________ Physical Exam:    08/18/2022    5:00 PM 08/18/2022    4:02 PM 08/18/2022    1:02 PM  Vitals with BMI  Height _0     Weight 330 lbs 11 oz    BMI 69.62    Systolic  952 841  Diastolic  71 63  Pulse  73 77     1. General:  in No  Acute distress   Chronically ill   -appearing 2. Psychological: Alert and   Oriented 3. Head/ENT Dry Mucous Membranes                          Head Non traumatic, neck supple                           Poor Dentition 4. SKIN: decreased Skin turgor,  Skin clean Dry and intact no rash left side of the neck with swelling firm no heat       5. Heart: Regular rate and rhythm no  Murmur, no Rub or gallop 6. Lungs: , no wheezes mild crackles   7. Abdomen: Soft,  non-tender, Non distended   obese  bowel sounds present 8. Lower extremities: no clubbing, cyanosis,  trace edema 9. Neurologically Grossly intact, moving all 4 extremities equally   10. MSK: Normal range of motion    Chart has been reviewed  ______________________________________________________________________________________________  Assessment/Plan 81 y.o. male with medical history significant of CAD, HTN, HLD, OSA, ILD, CKD 3a, chronic diastolic CHF  Admitted for   Acute suppurative parotitis      Present on Admission:  Parotid abscess  Coronary atherosclerosis of native coronary artery  Benign essential HTN  PVD (peripheral vascular disease) (HCC)  ILD (interstitial lung disease) ([place])  Chronic diastolic CHF (congestive heart failure) (HCC)  CKD (chronic kidney disease) stage 3, GFR 30-59 ml/min (HCC)  AKI (acute kidney injury) (HCC)  Lactic acidosis     Parotid abscess ABX vanc and Unasyn  ENT to follow,   Requested adding decadron 1 dose and warm compresses IR consult for AM NPO post  midnight   Coronary atherosclerosis of native coronary artery [place] stable cont Lipitor 80 mg po Qday Restart toprol when BP allows  Benign essential HTN Continue toprol 172mgpo q day  Hold cozaar given AKI  PVD [place]peripheral vascular disease) ([place] [place] Chronic stable  ILD (interstitial lung disease) (HCC) Chronic stable not on o2  Chronic diastolic CHF (congestive heart failure) ([facility] - currently appears to be slightly on the dry side,  carefuly follow fluid status and Cr   CKD (chronic kidney disease) stage 3, GFR 30-59 ml/min (HCC)  -chronic avoid nephrotoxic medications such as NSAIDs, Vanco Zosyn combo,  avoid hypotension, continue to follow renal function   AKI (acute kidney injury) ([facility] Obtain urine lytes, rehydrate and follow  Lactic acidosis Rehydrate and recheck   Other plan as per orders.  DVT prophylaxis:  SCD      Code Status:    Code Status: Not on file FULL CODE  as per patient   I had personally discussed CODE STATUS with patient and family     Family Communication:   Family   at  Bedside  plan of care was discussed   with  Wife,    Disposition Plan:       To home once workup is complete and patient is stable   Following barriers for discharge:                                                         Pain controlled with PO  medications                              , white count improving able to transition to PO antibiotics                                                       Will need consultants to evaluate patient prior to discharge     Consults called: ENT is aware, IR consulted  Admission status:  ED Disposition     ED Disposition  Lee: Frannie [[id number]]  Level of Care: Telemetry Medical [[id number]]  May admit patient to [facility] or [facility] if equivalent level of care is available:: No  Covid Evaluation: Asymptomatic - no recent exposure (last 10 days) testing not required   Diagnosis: Parotid abscess [[id number]]  Admitting Physician: Toy Baker [[id number]  Attending Physician: Toy Baker [[id number]]  Certification:: I certify this patient will need inpatient services for at least 2 midnights  Estimated Length of Stay: 2            inpatient     I Expect 2 midnight stay secondary to severity of patient's current illness need for inpatient interventions justified by the following:     Severe lab/radiological/exam abnormalities including:    Parotid abscess  and extensive comorbidities including:    CHF   CAD  ILD  Morbid Obesity   CKD    That are currently affecting medical management.   I expect  patient to be hospitalized for 2 midnights requiring inpatient medical care.  Patient is at high risk for adverse outcome (such as loss of life or disability) if not treated.  Indication for inpatient stay as follows:    Need for operative/procedural  intervention     Need for IV antibiotics, IV fluids    Level of care     tele  For   24H     Hildegarde Dunaway 08/18/2022, 7:40 PM    [facility]     after 2 AM please page floor coverage PA If 7AM-7PM, please contact the day team taking care of the patient using [email or web address]   Patient was evaluated in the context of the global COVID-19 pandemic, which necessitated consideration that the patient might be at risk for infection with the SARS-CoV-2 virus that causes COVID-19. Institutional protocols and algorithms that pertain to the evaluation of patients at risk for COVID-19 are in a state of rapid change based on information released by regulatory bodies including the CDC and federal and state organizations. These policies and algorithms were followed during the patient's care.

## 2022-08-18 NOTE — Assessment & Plan Note (Signed)
Continue toprol 131mgpo q day  Hold cozaar given AKI

## 2022-08-18 NOTE — ED Notes (Signed)
Patient being transported to ultrasound.

## 2022-08-19 ENCOUNTER — Other Ambulatory Visit: Payer: Self-pay

## 2022-08-19 ENCOUNTER — Encounter (HOSPITAL_COMMUNITY): Payer: Self-pay | Admitting: Internal Medicine

## 2022-08-19 ENCOUNTER — Inpatient Hospital Stay (HOSPITAL_COMMUNITY): Payer: Medicare HMO

## 2022-08-19 LAB — CBC
HCT: 38.2 % — ABNORMAL LOW (ref 39.0–52.0)
Hemoglobin: 12.4 g/dL — ABNORMAL LOW (ref 13.0–17.0)
MCH: 32.7 pg (ref 26.0–34.0)
MCHC: 32.5 g/dL (ref 30.0–36.0)
MCV: 100.8 fL — ABNORMAL HIGH (ref 80.0–100.0)
Platelets: 280 10*3/uL (ref 150–400)
RBC: 3.79 MIL/uL — ABNORMAL LOW (ref 4.22–5.81)
RDW: 14 % (ref 11.5–15.5)
WBC: 13.4 10*3/uL — ABNORMAL HIGH (ref 4.0–10.5)
nRBC: 0 % (ref 0.0–0.2)

## 2022-08-19 LAB — COMPREHENSIVE METABOLIC PANEL
ALT: 45 U/L — ABNORMAL HIGH (ref 0–44)
AST: 38 U/L (ref 15–41)
Albumin: 2.7 g/dL — ABNORMAL LOW (ref 3.5–5.0)
Alkaline Phosphatase: 57 U/L (ref 38–126)
Anion gap: 12 (ref 5–15)
BUN: 36 mg/dL — ABNORMAL HIGH (ref 8–23)
CO2: 18 mmol/L — ABNORMAL LOW (ref 22–32)
Calcium: 9.7 mg/dL (ref 8.9–10.3)
Chloride: 105 mmol/L (ref 98–111)
Creatinine, Ser: 2.04 mg/dL — ABNORMAL HIGH (ref 0.61–1.24)
GFR, Estimated: 32 mL/min — ABNORMAL LOW (ref >60)
Glucose, Bld: 154 mg/dL — ABNORMAL HIGH (ref 70–99)
Potassium: 4.5 mmol/L (ref 3.5–5.1)
Sodium: 135 mmol/L (ref 135–145)
Total Bilirubin: 0.5 mg/dL (ref 0.3–1.2)
Total Protein: 6.9 g/dL (ref 6.5–8.1)

## 2022-08-19 LAB — SODIUM, URINE, RANDOM: Sodium, Ur: 58 mmol/L

## 2022-08-19 LAB — PHOSPHORUS: Phosphorus: 4.1 mg/dL (ref 2.5–4.6)

## 2022-08-19 LAB — MAGNESIUM: Magnesium: 1.9 mg/dL (ref 1.7–2.4)

## 2022-08-19 LAB — PROCALCITONIN
Procalcitonin: 0.16 ng/mL
Procalcitonin: 0.21 ng/mL

## 2022-08-19 LAB — PREALBUMIN: Prealbumin: 14 mg/dL — ABNORMAL LOW (ref 18–38)

## 2022-08-19 LAB — CREATININE, URINE, RANDOM: Creatinine, Urine: 117 mg/dL

## 2022-08-19 MED ORDER — MAGNESIUM SULFATE 2 GM/50ML IV SOLN
2.0000 g | Freq: Once | INTRAVENOUS | Status: AC
  Administered 2022-08-19: 2 g via INTRAVENOUS
  Filled 2022-08-19: qty 50

## 2022-08-19 MED ORDER — LIDOCAINE HCL (PF) 1 % IJ SOLN
INTRAMUSCULAR | Status: AC
  Filled 2022-08-19: qty 30

## 2022-08-19 MED ORDER — VANCOMYCIN HCL 1250 MG/250ML IV SOLN
1250.0000 mg | INTRAVENOUS | Status: DC
  Administered 2022-08-19 – 2022-08-21 (×3): 1250 mg via INTRAVENOUS
  Filled 2022-08-19 (×3): qty 250

## 2022-08-19 MED ORDER — LIDOCAINE HCL (PF) 1 % IJ SOLN: 5.0000 mL | Freq: Once | INTRAMUSCULAR | Status: DC

## 2022-08-19 NOTE — Progress Notes (Signed)
Pharmacy Antibiotic Note  Marcus [name] is a 81 y.o. male admitted on 08/18/2022 with wound infection.  Pharmacy has been consulted for Vancomycin and Unasyn dosing.  Patient with improving AKI, Scr:  2.04. Appears Scr was 1.3-1.5 over the last few years.   Of note 2g load was ordered for 12/8, appears only 1g was given.   Plan: Vancomycin 1250 Q24H, eAUC: 504. Will time to start a little earlier than approximately 24 hours from last dose as patient did not receive full loading dose.  Unasyn 3g Q6H Monitor renal function for dose adjustments F/u weight to adjust dose/frequency as needed  Height: 6' 3" (190.5 cm) Weight: (!) 150 kg (330 lb 11 oz) IBW/kg (Calculated) : 84.5  Temp (24hrs), Avg:98.6 F (37 C), Min:97.5 F (36.4 C), Max:101 F (38.3 C)  Recent Labs  Lab 08/18/22 1307 08/18/22 1349 08/18/22 1415 08/18/22 2104 08/19/22 0514  WBC 16.0*  --   --   --  13.4*  CREATININE 2.22*  --  2.40*  --  2.04*  LATICACIDVEN  --  2.2*  --  1.2  --      Estimated Creatinine Clearance: 45.2 mL/min (A) (by C-G formula based on SCr of 2.04 mg/dL (H)).    Allergies  Allergen Reactions   Pirfenidone Rash   Ofev [Nintedanib] Diarrhea   Thank you for allowing pharmacy to be a part of this patient's care.  Esmeralda Arthur, PharmD, BCCCP  Clinical Pharmacist 08/19/2022 10:33 AM

## 2022-08-19 NOTE — Progress Notes (Signed)
Consultation Progress Note   Patient: Marcus [name] TOI:[id number] DOB: 1941-06-13 DOA: 08/18/2022 DOS: the patient was seen and examined on 08/19/2022 Primary service: DibiaManfred Shirts, MD  Brief hospital course: 81 y.o. male with medical history significant of CAD, HTN, HLD, OSA, ILD, CKD 3a, chronic diastolic CHF  He presented with left neck swelling  He had a biopsy 11/27 of left parotid mixed cystic and solid lesion done next day he started to have swelling and pain started on abx He was told he has possible  parotid abscess and was sent to ER  Assessment and Plan: Parotid abscess Planned US guided aspiration of the left parotid abscess  ABX vanc and Unasyn Follow cx  Coronary atherosclerosis of native coronary artery [place] stable cont Lipitor 80 mg po Qday Restart toprol when BP allows   Benign essential HTN Continue toprol 144mgpo q day  Hold cozaar given AKI   PVD (peripheral vascular disease) ([facility] Chronic stable   ILD (interstitial lung disease) ([facility] Chronic stable not on o2   Chronic diastolic CHF (congestive heart failure) ([place] - currently appears to be slightly on the dry side,  carefuly follow fluid status and Cr     CKD (chronic kidney disease) stage 3, GFR 30-59 ml/min (HCC)  -chronic avoid nephrotoxic medications such as NSAIDs, Vanco Zosyn combo,  avoid hypotension, continue to follow renal function     AKI (acute kidney injury) ([place] Obtain urine lytes, rehydrate and follow   Lactic acidosis Rehydrate and recheck   Other plan as per orders.       TR[facility] will continue to follow the patient.  Subjective: Reports left jaw swelling and tenderness.  Physical Exam: Vitals:   08/19/22 0600 08/19/22 0700 08/19/22 0940 08/19/22 0956  BP: (!) 120/59 114/61 (!) 148/53   Pulse: 81 77 94   Resp: _0 Temp:    (!) 97.5 F (36.4 C)  TempSrc:    Oral  SpO2: 99% 99% 100%   Weight:      Height:       Physical Exam Vitals and nursing note  reviewed.  Constitutional:      Appearance: Normal appearance.  HENT:     Head: Normocephalic and atraumatic.     Jaw: Swelling present.     Comments: left jaw erythema, tenderness and  swelling.   Cardiovascular:     Rate and Rhythm: Normal rate and regular rhythm.     Heart sounds: Normal heart sounds, S1 normal and S2 normal.  Pulmonary:     Effort: Pulmonary effort is normal.     Breath sounds: Normal breath sounds.  Abdominal:     General: Abdomen is flat.     Palpations: Abdomen is soft.  Skin:    General: Skin is cool and dry.  Neurological:     Mental Status: He is alert.     Data Reviewed:  There are no new results to review at this time.  Family Communication:   Time spent: 15 minutes.  Author: PCristela Felt MD 08/19/2022 11:31 AM  For on call review [email or web address]

## 2022-08-19 NOTE — ED Notes (Signed)
Family at bedside given an update

## 2022-08-19 NOTE — Progress Notes (Signed)
NEW ADMISSION NOTE New Admission Note:   Arrival Method: ED Stretcher Mental Orientation: AAox4 Telemetry: 64M 10 Assessment: Completed Skin: to be completed IV: RAC Pain: 0/10 Tubes: n/a Safety Measures: Safety Fall Prevention Plan has been given, discussed and signed Admission: Completed [facility] Orientation: Patient has been orientated to the room, unit and staff.  Family: none at bedside  Orders have been reviewed and implemented. Will continue to monitor the patient. Call light has been placed within reach and bed alarm has been activated.   Vira Agar, RN

## 2022-08-19 NOTE — ED Notes (Addendum)
Updated patient on provider Hoshal's plan to come see patient after 430 PM. Per provider patient is able to eat and drink at this time. This RN updated patient of same and patient verbalized understanding.

## 2022-08-19 NOTE — Procedures (Signed)
Vascular and Interventional Radiology Procedure Note  Patient: Marcus [name] DOB: 1941-03-07 Medical Record Number: [id number] Note Date/Time: 08/19/22 12:16 PM   Performing Physician: Michaelle Birks, MD Assistant(s): None  Diagnosis: Facial swelling. Recent L parotid cystic mass aspiration and Bx on 08/08/22.  Procedure: ASPIRATION OF LEFT PAROTID FLUID COLLECTION  Anesthesia: Local Anesthetic Complications: None Estimated Blood Loss: Minimal Specimens: Sent for Gram Stain, Aerobe Culture, and Anerobe Culture  Findings:  Successful Ultrasound-guided ASPIRATION of LEFT parotid fluid collection. 3 mLs of thin purulent fluid was aspirated and submitted for microbiological analysis.   Plan:  Follow up with ENT for Abx and micro results.  See detailed procedure note with images in PACS. The patient tolerated the procedure well without incident or complication and was returned to  ER  in stable condition.    Michaelle Birks, MD [facility] Radiology   Pager. [place]

## 2022-08-19 NOTE — ED Notes (Signed)
ED TO INPATIENT HANDOFF REPORT  ED Nurse Name and Phone #: Demri Poulton RN [address] Name/Age/Gender Marcus [name] 81 y.o. male Room/Bed: 66C/042C  Code Status   Code Status: Full Code  Home/SNF/Other Home Patient oriented to: self, place, time, and situation Is this baseline? Yes   Triage Complete: Triage complete  Chief Complaint Parotid abscess [K11.3]  Triage Note Patient called and instructed to go to ED after a biopsy on his neck for concern "unlikely but possible parotid abscess after biopsy with interventional radiology". Patient is afebrile, alert, oriented, and in no apparent distress at this time.   Allergies Allergies  Allergen Reactions   Pirfenidone Rash   Ofev [Nintedanib] Diarrhea    Level of Care/Admitting Diagnosis ED Disposition     ED Disposition  Admit   Condition  --   [place]: [place] [[id number]]  Level of Care: Telemetry Medical [104]  May admit patient to [facility] or [facility] if equivalent level of care is available:: No  Covid Evaluation: Asymptomatic - no recent exposure (last 10 days) testing not required  Diagnosis: Parotid abscess [701779]  Admitting Physician: Toy Baker [[id number]]  Attending Physician: Toy Baker [[id number]]  Certification:: I certify this patient will need inpatient services for at least 2 midnights  Estimated Length of Stay: 2          B Medical/Surgery History Past Medical History:  Diagnosis Date   Ascending aorta dilatation ([place])    44m gy echo 01/2020   Benign essential HTN 09/09/2013   Coronary artery disease 08/2013   S/P PCI of the LAD   Dyslipidemia    Elevated fasting glucose    Fatty liver    Glaucoma    Gout    Hyperlipidemia    Hypertension    Joint pain    LAD (lymphadenopathy)    Obesity    Pure hypercholesterolemia 09/09/2013   PVD (peripheral vascular disease) ([place] 06/12/2018     30-49% left common femoral stenosis   Vision  abnormalities    Past Surgical History:  Procedure Laterality Date   ADENOIDECTOMY     CATARACT EXTRACTION     CORONARY ANGIOPLASTY WITH STENT PLACEMENT  2009   EYE SURGERY     TONSILLECTOMY     WISDOM TOOTH EXTRACTION       A IV Location/Drains/Wounds Patient Lines/Drains/Airways Status     Active Line/Drains/Airways     Name Placement date Placement time Site Days   Peripheral IV 08/18/22 20 G Right Antecubital 08/18/22  1825  Antecubital  1            Intake/Output Last 24 hours  Intake/Output Summary (Last 24 hours) at 08/19/2022 1646 Last data filed at 08/19/2022 0554 Gross per 24 hour  Intake 547.56 ml  Output --  Net 547.56 ml    Labs/Imaging Results for orders placed or performed during the hospital encounter of 08/18/22 (from the past 48 hour(s))  Comprehensive metabolic panel     Status: Abnormal   Collection Time: 08/18/22  1:07 PM  Result Value Ref Range   Sodium 140 135 - 145 mmol/L   Potassium 5.1 3.5 - 5.1 mmol/L   Chloride 112 (H) 98 - 111 mmol/L   CO2 20 (L) 22 - 32 mmol/L   Glucose, Bld 127 (H) 70 - 99 mg/dL    Comment: Glucose reference range applies only to samples taken after fasting for at least 8 hours.   BUN 38 (H)  8 - 23 mg/dL   Creatinine, Ser 2.22 (H) 0.61 - 1.24 mg/dL   Calcium 10.0 8.9 - 10.3 mg/dL   Total Protein 7.1 6.5 - 8.1 g/dL   Albumin 2.7 (L) 3.5 - 5.0 g/dL   AST 41 15 - 41 U/L   ALT 53 (H) 0 - 44 U/L   Alkaline Phosphatase 64 38 - 126 U/L   Total Bilirubin 0.5 0.3 - 1.2 mg/dL   GFR, Estimated 29 (L) >60 mL/min    Comment: (NOTE) Calculated using the CKD-EPI Creatinine Equation (2021)    Anion gap 8 5 - 15    Comment: Performed at [facility], [place] [address], [place], [place] [address]  CBC with Differential     Status: Abnormal   Collection Time: 08/18/22  1:07 PM  Result Value Ref Range   WBC 16.0 (H) 4.0 - 10.5 K/uL   RBC 3.79 (L) 4.22 - 5.81 MIL/uL   Hemoglobin 12.7 (L) 13.0 - 17.0 g/dL   HCT 38.4 (L)  39.0 - 52.0 %   MCV 101.3 (H) 80.0 - 100.0 fL   MCH 33.5 26.0 - 34.0 pg   MCHC 33.1 30.0 - 36.0 g/dL   RDW 14.3 11.5 - 15.5 %   Platelets 263 150 - 400 K/uL   nRBC 0.0 0.0 - 0.2 %   Neutrophils Relative % 85 %   Neutro Abs 13.6 (H) 1.7 - 7.7 K/uL   Lymphocytes Relative 5 %   Lymphs Abs 0.8 0.7 - 4.0 K/uL   Monocytes Relative 7 %   Monocytes Absolute 1.1 (H) 0.1 - 1.0 K/uL   Eosinophils Relative 2 %   Eosinophils Absolute 0.3 0.0 - 0.5 K/uL   Basophils Relative 0 %   Basophils Absolute 0.1 0.0 - 0.1 K/uL   Immature Granulocytes 1 %   Abs Immature Granulocytes 0.14 (H) 0.00 - 0.07 K/uL    Comment: Performed at [facility], [address] [place], [place] [address]  Lactic acid, plasma     Status: Abnormal   Collection Time: 08/18/22  1:49 PM  Result Value Ref Range   Lactic Acid, Venous 2.2 (HH) 0.5 - 1.9 mmol/L    Comment: CRITICAL RESULT CALLED TO, READ BACK BY AND VERIFIED WITH KOHUT N, RN _0  08/18/22. [facility] Performed at [facility], [place] [address] [place], [place] [address]   I-stat chem 8, ED     Status: Abnormal   Collection Time: 08/18/22  2:15 PM  Result Value Ref Range   Sodium 141 135 - 145 mmol/L   Potassium 5.2 (H) 3.5 - 5.1 mmol/L   Chloride 112 (H) 98 - 111 mmol/L   BUN 41 (H) 8 - 23 mg/dL   Creatinine, Ser 2.40 (H) 0.61 - 1.24 mg/dL   Glucose, Bld 123 (H) 70 - 99 mg/dL    Comment: Glucose reference range applies only to samples taken after fasting for at least 8 hours.   Calcium, Ion 1.35 1.15 - 1.40 mmol/L   TCO2 21 (L) 22 - 32 mmol/L   Hemoglobin 12.9 (L) 13.0 - 17.0 g/dL   HCT 38.0 (L) 39.0 - 52.0 %  Lactic acid, plasma     Status: None   Collection Time: 08/18/22  9:04 PM  Result Value Ref Range   Lactic Acid, Venous 1.2 0.5 - 1.9 mmol/L    Comment: Performed at [facility] [address] [place], [place] [address]  CK  Status: None   Collection Time: 08/18/22  9:04 PM  Result Value Ref Range   Total CK 100 49 - 397  U/L    Comment: HEMOLYSIS AT THIS LEVEL MAY AFFECT RESULT Performed at [facility], [place] [address], [place], [place] [address]   Hepatic function panel     Status: Abnormal   Collection Time: 08/18/22  9:04 PM  Result Value Ref Range   Total Protein 6.1 (L) 6.5 - 8.1 g/dL   Albumin 2.5 (L) 3.5 - 5.0 g/dL   AST 42 (H) 15 - 41 U/L    Comment: HEMOLYSIS AT THIS LEVEL MAY AFFECT RESULT   ALT 44 0 - 44 U/L    Comment: HEMOLYSIS AT THIS LEVEL MAY AFFECT RESULT   Alkaline Phosphatase 52 38 - 126 U/L   Total Bilirubin 1.7 (H) 0.3 - 1.2 mg/dL    Comment: HEMOLYSIS AT THIS LEVEL MAY AFFECT RESULT   Bilirubin, Direct 0.4 (H) 0.0 - 0.2 mg/dL    Comment: HEMOLYSIS AT THIS LEVEL MAY AFFECT RESULT   Indirect Bilirubin 1.3 (H) 0.3 - 0.9 mg/dL    Comment: Performed at [facility] [address], [place], [place] [address]  Magnesium     Status: Abnormal   Collection Time: 08/18/22  9:04 PM  Result Value Ref Range   Magnesium 1.3 (L) 1.7 - 2.4 mg/dL    Comment: HEMOLYSIS AT THIS LEVEL MAY AFFECT RESULT Performed at [facility], [place] [address] [place], [place] [address]   Phosphorus     Status: None   Collection Time: 08/18/22  9:04 PM  Result Value Ref Range   Phosphorus 2.8 2.5 - 4.6 mg/dL    Comment: HEMOLYSIS AT THIS LEVEL MAY AFFECT RESULT Performed at [facility], [place] [address], [place], [place] [address]   TSH     Status: None   Collection Time: 08/18/22  9:04 PM  Result Value Ref Range   TSH 2.300 0.350 - 4.500 uIU/mL    Comment: Performed by a 3rd Generation assay with a functional sensitivity of <=0.01 uIU/mL. Performed at [facility], [place] [address], [place], [place] [address]   Creatinine, urine, random     Status: None   Collection Time: 08/18/22 11:14 PM  Result Value Ref Range   Creatinine, Urine 117 mg/dL    Comment: Performed at [facility] [address], [place], [place] [address]  Sodium, urine, random     Status: None    Collection Time: 08/18/22 11:14 PM  Result Value Ref Range   Sodium, Ur 58 mmol/L    Comment: Performed at [facility] [address] [place], [place] [address]  Urinalysis, Complete w Microscopic     Status: None   Collection Time: 08/18/22 11:14 PM  Result Value Ref Range   Color, Urine YELLOW YELLOW   APPearance CLEAR CLEAR   Specific Gravity, Urine 1.017 1.005 - 1.030   pH 5.0 5.0 - 8.0   Glucose, UA NEGATIVE NEGATIVE mg/dL   Hgb urine dipstick NEGATIVE NEGATIVE   Bilirubin Urine NEGATIVE NEGATIVE   Ketones, ur NEGATIVE NEGATIVE mg/dL   Protein, ur NEGATIVE NEGATIVE mg/dL   Nitrite NEGATIVE NEGATIVE   Leukocytes,Ua NEGATIVE NEGATIVE   RBC / HPF 0-5 0 - 5 RBC/hpf   WBC, UA 0-5 0 - 5 WBC/hpf   Bacteria, UA NONE SEEN NONE SEEN   Squamous Epithelial / LPF 0-5 0 - 5  Mucus PRESENT     Comment: Performed at [facility], [place] [address], [place], [place] [address]  Procalcitonin     Status: None   Collection Time: 08/18/22 11:15 PM  Result Value Ref Range   Procalcitonin 0.16 ng/mL    Comment:        Interpretation: PCT (Procalcitonin) <= 0.5 ng/mL: Systemic infection (sepsis) is not likely. Local bacterial infection is possible. (NOTE)       Sepsis PCT Algorithm           Lower Respiratory Tract                                      Infection PCT Algorithm    ----------------------------     ----------------------------         PCT < 0.25 ng/mL                PCT < 0.10 ng/mL          Strongly encourage             Strongly discourage   discontinuation of antibiotics    initiation of antibiotics    ----------------------------     -----------------------------       PCT 0.25 - 0.50 ng/mL            PCT 0.10 - 0.25 ng/mL               OR       >80% decrease in PCT            Discourage initiation of                                            antibiotics      Encourage discontinuation           of antibiotics    ----------------------------      -----------------------------         PCT >= 0.50 ng/mL              PCT 0.26 - 0.50 ng/mL               AND        <80% decrease in PCT             Encourage initiation of                                             antibiotics       Encourage continuation           of antibiotics    ----------------------------     -----------------------------        PCT >= 0.50 ng/mL                  PCT > 0.50 ng/mL               AND         increase in PCT                  Strongly encourage  initiation of antibiotics    Strongly encourage escalation           of antibiotics                                     -----------------------------                                           PCT <= 0.25 ng/mL                                                 OR                                        > 80% decrease in PCT                                      Discontinue / Do not initiate                                             antibiotics  Performed at [facility], [address], [place], [place] [address]   Prealbumin     Status: Abnormal   Collection Time: 08/19/22  5:14 AM  Result Value Ref Range   Prealbumin 14 (L) 18 - 38 mg/dL    Comment: Performed at [facility] [address] [place], [place] [address]  Procalcitonin     Status: None   Collection Time: 08/19/22  5:14 AM  Result Value Ref Range   Procalcitonin 0.21 ng/mL    Comment:        Interpretation: PCT (Procalcitonin) <= 0.5 ng/mL: Systemic infection (sepsis) is not likely. Local bacterial infection is possible. (NOTE)       Sepsis PCT Algorithm           Lower Respiratory Tract                                      Infection PCT Algorithm    ----------------------------     ----------------------------         PCT < 0.25 ng/mL                PCT < 0.10 ng/mL          Strongly encourage             Strongly discourage   discontinuation of antibiotics    initiation of  antibiotics    ----------------------------     -----------------------------       PCT 0.25 - 0.50 ng/mL            PCT 0.10 - 0.25 ng/mL               OR       >80% decrease in  PCT            Discourage initiation of                                            antibiotics      Encourage discontinuation           of antibiotics    ----------------------------     -----------------------------         PCT >= 0.50 ng/mL              PCT 0.26 - 0.50 ng/mL               AND        <80% decrease in PCT             Encourage initiation of                                             antibiotics       Encourage continuation           of antibiotics    ----------------------------     -----------------------------        PCT >= 0.50 ng/mL                  PCT > 0.50 ng/mL               AND         increase in PCT                  Strongly encourage                                      initiation of antibiotics    Strongly encourage escalation           of antibiotics                                     -----------------------------                                           PCT <= 0.25 ng/mL                                                 OR                                        > 80% decrease in PCT                                      Discontinue / Do not initiate  antibiotics  Performed at [facility], [place] [address] [place], [place] [address]   Comprehensive metabolic panel     Status: Abnormal   Collection Time: 08/19/22  5:14 AM  Result Value Ref Range   Sodium 135 135 - 145 mmol/L   Potassium 4.5 3.5 - 5.1 mmol/L   Chloride 105 98 - 111 mmol/L   CO2 18 (L) 22 - 32 mmol/L   Glucose, Bld 154 (H) 70 - 99 mg/dL    Comment: Glucose reference range applies only to samples taken after fasting for at least 8 hours.   BUN 36 (H) 8 - 23 mg/dL   Creatinine, Ser 2.04 (H) 0.61 - 1.24 mg/dL   Calcium 9.7 8.9 - 10.3 mg/dL   Total Protein  6.9 6.5 - 8.1 g/dL   Albumin 2.7 (L) 3.5 - 5.0 g/dL   AST 38 15 - 41 U/L   ALT 45 (H) 0 - 44 U/L   Alkaline Phosphatase 57 38 - 126 U/L   Total Bilirubin 0.5 0.3 - 1.2 mg/dL   GFR, Estimated 32 (L) >60 mL/min    Comment: (NOTE) Calculated using the CKD-EPI Creatinine Equation (2021)    Anion gap 12 5 - 15    Comment: Performed at [facility] [address] [place], [place] [address]  CBC     Status: Abnormal   Collection Time: 08/19/22  5:14 AM  Result Value Ref Range   WBC 13.4 (H) 4.0 - 10.5 K/uL   RBC 3.79 (L) 4.22 - 5.81 MIL/uL   Hemoglobin 12.4 (L) 13.0 - 17.0 g/dL   HCT 38.2 (L) 39.0 - 52.0 %   MCV 100.8 (H) 80.0 - 100.0 fL   MCH 32.7 26.0 - 34.0 pg   MCHC 32.5 30.0 - 36.0 g/dL   RDW 14.0 11.5 - 15.5 %   Platelets 280 150 - 400 K/uL   nRBC 0.0 0.0 - 0.2 %    Comment: Performed at [facility], [place] [address], [place], [place] [address]  Magnesium     Status: None   Collection Time: 08/19/22  5:14 AM  Result Value Ref Range   Magnesium 1.9 1.7 - 2.4 mg/dL    Comment: Performed at [facility] [address] [place], [place] [address]  Phosphorus     Status: None   Collection Time: 08/19/22  5:14 AM  Result Value Ref Range   Phosphorus 4.1 2.5 - 4.6 mg/dL    Comment: Performed at [facility] [address] [place], [place] [address]   [place] FINE NEEDLE ASP 1ST LESION  Result Date: 08/19/2022 INDICATION: Facial swelling. Recent L parotid cystic mass aspiration and Bx on 08/08/22. EXAM: ULTRASOUND-GUIDED LEFT PAROTID FLUID COLLECTION ASPIRATION COMPARISON:  US soft tissue and CT neck neck, 08/18/2022. IR ultrasound, 08/08/2022 MEDICATIONS: None ANESTHESIA/SEDATION: Local anesthetic was administered. CONTRAST:  None COMPLICATIONS: None immediate. PROCEDURE: Informed written consent was obtained from the patient after a discussion of the risks, benefits and alternatives to treatment. Preprocedural ultrasound scanning demonstrated LEFT parotid fluid  collection, measuring up to 2.3 x 2.0 x 1.4 cm. A timeout was performed prior to the initiation of the procedure. The LEFT face and neck was prepped and draped in the usual sterile fashion. The overlying soft tissues were anesthetized with 1% lidocaine with epinephrine. Under direct ultrasound guidance, a 18 gauge trocar needle was advanced into the abscess/fluid collection. Multiple ultrasound images were saved for procedural documentation purposes. Next,  approximately 3 mL was aspirated from the collection. A representative sample of aspirated fluid was capped and sent to the laboratory for analysis. The needle was removed and superficial hemostasis was achieved with manual compression. A dressing was placed. The patient tolerated the procedure well without immediate postprocedural complication. IMPRESSION: Successful US guided aspiration of 3 mL of thin purulent fluid from LEFT parotid fluid collection. A representative aspirated sample was sent to the laboratory as requested by the ordering clinical team. Michaelle Birks, MD [facility] Electronically Signed   By: Michaelle Birks M.D.   On: 08/19/2022 16:37   US SOFT TISSUE HEAD & NECK (NON-THYROID)  Result Date: 08/18/2022 CLINICAL DATA:  Parotid swelling. EXAM: ULTRASOUND OF HEAD/NECK SOFT TISSUES TECHNIQUE: Ultrasound examination of the head and neck soft tissues was performed in the area of clinical concern. COMPARISON:  CT neck 08/18/2022 FINDINGS: There is a 2.1 x 1.2 x 2.0 cm well-circumscribed nonvascular low-attenuation/fluid collection in the region of the left parotid gland. There is no surrounding hypervascularity. No calcifications. IMPRESSION: 2.1 x 1.2 x 2.0 cm well-circumscribed nonvascular low-attenuation/fluid collection in the region of the left parotid gland. This may represent a parotid cyst or abscess. Electronically Signed   By: Ronney Asters M.D.   On: 08/18/2022 18:17   CT Soft  Tissue Neck Wo Contrast  Result Date: 08/18/2022 CLINICAL DATA:  Soft tissue infection suspected after left parotid biopsy. EXAM: CT NECK WITHOUT CONTRAST TECHNIQUE: Multidetector CT imaging of the neck was performed following the standard protocol without intravenous contrast. RADIATION DOSE REDUCTION: This exam was performed according to the departmental dose-optimization program which includes automated exposure control, adjustment of the mA and/or kV according to patient size and/or use of iterative reconstruction technique. COMPARISON:  CT Face 06/09/22 FINDINGS: Pharynx and larynx: Normal. No mass or swelling. Salivary glands: Redemonstrated left sided intraparotid soft tissue. there is asymmetric soft tissue surrounding the left parotid gland. The right parotid gland is not visualized, possibly surgically absent. The appearance of the submandibular glands is unchanged compared to prior exam. Thyroid: Normal. Lymph nodes: None enlarged or abnormal density. Vascular: Negative. Limited intracranial: Negative. Visualized orbits: Negative. Mastoids and visualized paranasal sinuses: Clear. Skeleton: No acute or aggressive process. Upper chest: Negative. Other: There is a biopsy tract along the posterolateral left neck (series 3, image 63). There is asymmetric skin thickening and subcutaneous soft tissue stranding in the left neck with asymmetric thickening and stranding of the left platysma and sternocleidomastoid muscle bodies (series 3, image 78,79). IMPRESSION: Asymmetric soft tissue stranding surrounding the left parotid gland with associated left neck skin thickening and subcutaneous soft tissue stranding along the left platysma and sternocleidomastoid muscle body. Findings are favored to be infectious in nature. The lack of IV contrast precludes assessment for an abscess. Electronically Signed   By: Marin Roberts M.D.   On: 08/18/2022 16:37    Pending Labs Unresulted Labs (From admission, onward)      Start     Ordered   08/19/22 1133  Aerobic/Anaerobic Culture w Gram Stain (surgical/deep wound)  Once,   STAT       Comments: LEFT parotid fluid collection, question abscess    08/19/22 1132   08/19/22 0500  Procalcitonin  Daily,   R      08/18/22 1905   08/18/22 1904  Lactic acid, plasma  STAT Now then every 3 hours,   R     Question:  Release to patient  Answer:  Immediate  08/18/22 1903            Vitals/Pain Today's Vitals   08/19/22 1339 08/19/22 1500 08/19/22 1600 08/19/22 1615  BP:  (!) 123/46 (!) 129/49   Pulse:  73 78 73  Resp:  _0 Temp: 98 F (36.7 C)     TempSrc: Oral     SpO2:  100% 100% 100%  Weight:      Height:      PainSc:        Isolation Precautions No active isolations  Medications Medications  atorvastatin (LIPITOR) tablet 80 mg (80 mg Oral Given 08/19/22 0953)  dorzolamide-timolol (COSOPT) 2-0.5 % ophthalmic solution 1 drop (1 drop Left Eye Not Given 08/19/22 0921)  latanoprost (XALATAN) 0.005 % ophthalmic solution 1 drop (1 drop Left Eye Not Given 08/19/22 0921)  metoprolol succinate (TOPROL-XL) 24 hr tablet 100 mg (100 mg Oral Given 08/19/22 0952)  tamsulosin (FLOMAX) capsule 0.4 mg (0.4 mg Oral Given 08/19/22 0952)  0.9 %  sodium chloride infusion (0 mLs Intravenous Stopped 08/19/22 0746)  acetaminophen (TYLENOL) tablet 650 mg (has no administration in time range)    Or  acetaminophen (TYLENOL) suppository 650 mg (has no administration in time range)  fentaNYL (SUBLIMAZE) injection 12.5-50 mcg (has no administration in time range)  Ampicillin-Sulbactam (UNASYN) 3 g in sodium chloride 0.9 % 100 mL IVPB (0 g Intravenous Stopped 08/19/22 1352)  vancomycin (VANCOREADY) IVPB 1250 mg/250 mL (has no administration in time range)  lidocaine (PF) (XYLOCAINE) 1 % injection 5 mL (has no administration in time range)  Ampicillin-Sulbactam (UNASYN) 3 g in sodium chloride 0.9 % 100 mL IVPB (0 g Intravenous Stopped 08/18/22 1901)  vancomycin (VANCOCIN) IVPB  1000 mg/200 mL premix (0 mg Intravenous Stopped 08/18/22 2254)  dexamethasone (DECADRON) injection 10 mg (10 mg Intravenous Given 08/18/22 2153)  magnesium sulfate IVPB 2 g 50 mL (0 g Intravenous Stopped 08/19/22 0327)  lidocaine (PF) (XYLOCAINE) 1 % injection (  Given 08/19/22 1213)    Mobility walks Low fall risk   Focused Assessments Cardiac Assessment Handoff:    Lab Results  Component Value Date   CKTOTAL 100 08/18/2022   Lab Results  Component Value Date   DDIMER 0.32 01/28/2020   Does the Patient currently have chest pain? No   , Pulmonary Assessment Handoff:  Lung sounds:   O2 Device: Room Air      R Recommendations: See Admitting Provider Note  Report given to:   Additional Notes:

## 2022-08-19 NOTE — ED Notes (Signed)
Patient in [place] at this time. Family remains in room.

## 2022-08-19 NOTE — Progress Notes (Signed)
ENT PROGRESS NOTE  ID: 81 y/o M with suppurative parotitis, probably parotid abscess   S: AFVSS overnight. On Unasyn/vanc. Pain improving. Patient eager to discharge the hospital for family event. Awaiting IR Procedure for presumed 2cm abscess seen on ultrasound. CT neck without contrast due to CKD.  O: Resting comfortably in NAD in exam chair. Significant left facial swelling stable, no worse. Dressing removed. Dark bloody drainage massaged from prior IR site post-auricualr region. Facial nerve fully intact.  Results reviewed:  US Neck 08/18/22 IMPRESSION: 2.1 x 1.2 x 2.0 cm well-circumscribed nonvascular low-attenuation/fluid collection in the region of the left parotid gland. This may represent a parotid cyst or abscess.     Electronically Signed   By: Ronney Asters M.D.   On: 08/18/2022 18:17  CT neck without contrast 08/18/22 IMPRESSION: Asymmetric soft tissue stranding surrounding the left parotid gland with associated left neck skin thickening and subcutaneous soft tissue stranding along the left platysma and sternocleidomastoid muscle body. Findings are favored to be infectious in nature. The lack of IV contrast precludes assessment for an abscess.     Electronically Signed   By: Marin Roberts M.D.   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  - Proceed with IR US Guided drainage of abscess - IV abx - Warm compresses - After procedure I will place a facial pressure dressing  - Disposition pending clinical course and result of procedure  Dispo: medicine ward. IR Procedure today  Electronically signed by:  Jenetta Downer, MD  Staff Physician Facial Plastic & Reconstructive Surgery Otolaryngology - Head and Neck Surgery [place], [place]

## 2022-08-19 NOTE — ED Notes (Signed)
PT to ultrasound

## 2022-08-19 NOTE — ED Notes (Signed)
Surgery at bedside to assess patient and apply new bandage.

## 2022-08-19 NOTE — Consult Note (Signed)
IR was requested for left parotid abscess aspiration.   Patient is s/p left parotid cystic lesions biopsy/aspiration on 08/08/22, who developed difficulty swallowing due to enlarging and tender swelling around the left parotid gland. It was aspirated at outpatient office and purulent material was aspirated, he was sent to ED for further eval and management.   Imaging in ED showed 2.1 x 1.2 x 2.0 cm well-circumscribed nonvascular low-attenuation/fluid collection in the region of the left parotid gland which may represent a parotid cyst or abscess.  IR was requested for aspiration, case reviewed by Dr. Maryelizabeth Kaufmann and approved for US guided aspiration of the left parotid abscess.   Patient seen in [place] department, laying in bed NAD.  Reports tenderness around the left jaw line swelling, denies fever or chills.  + swelling on left jaw line, + TTP, mild erythema   Risks and benefits discussed with the patient including bleeding, infection, damage to adjacent structures, and low yield.   All of the patient's questions were answered, patient is agreeable to proceed. Consent signed and in chart.   Armando Gang Jayonna Meyering PA-C 08/19/2022 11:24 AM

## 2022-08-20 DIAGNOSIS — K113 Abscess of salivary gland: Secondary | ICD-10-CM | POA: Diagnosis not present

## 2022-08-20 LAB — BASIC METABOLIC PANEL
Anion gap: 12 (ref 5–15)
BUN: 46 mg/dL — ABNORMAL HIGH (ref 8–23)
CO2: 17 mmol/L — ABNORMAL LOW (ref 22–32)
Calcium: 9.6 mg/dL (ref 8.9–10.3)
Chloride: 108 mmol/L (ref 98–111)
Creatinine, Ser: 1.87 mg/dL — ABNORMAL HIGH (ref 0.61–1.24)
GFR, Estimated: 36 mL/min — ABNORMAL LOW (ref >60)
Glucose, Bld: 105 mg/dL — ABNORMAL HIGH (ref 70–99)
Potassium: 4.6 mmol/L (ref 3.5–5.1)
Sodium: 137 mmol/L (ref 135–145)

## 2022-08-20 LAB — CBC WITH DIFFERENTIAL/PLATELET
Abs Immature Granulocytes: 0.18 10*3/uL — ABNORMAL HIGH (ref 0.00–0.07)
Basophils Absolute: 0 10*3/uL (ref 0.0–0.1)
Basophils Relative: 0 %
Eosinophils Absolute: 0 10*3/uL (ref 0.0–0.5)
Eosinophils Relative: 0 %
HCT: 32.5 % — ABNORMAL LOW (ref 39.0–52.0)
Hemoglobin: 11.2 g/dL — ABNORMAL LOW (ref 13.0–17.0)
Immature Granulocytes: 1 %
Lymphocytes Relative: 8 %
Lymphs Abs: 1.4 10*3/uL (ref 0.7–4.0)
MCH: 33.6 pg (ref 26.0–34.0)
MCHC: 34.5 g/dL (ref 30.0–36.0)
MCV: 97.6 fL (ref 80.0–100.0)
Monocytes Absolute: 1.4 10*3/uL — ABNORMAL HIGH (ref 0.1–1.0)
Monocytes Relative: 8 %
Neutro Abs: 15.3 10*3/uL — ABNORMAL HIGH (ref 1.7–7.7)
Neutrophils Relative %: 83 %
Platelets: 241 10*3/uL (ref 150–400)
RBC: 3.33 MIL/uL — ABNORMAL LOW (ref 4.22–5.81)
RDW: 13.8 % (ref 11.5–15.5)
WBC: 18.3 10*3/uL — ABNORMAL HIGH (ref 4.0–10.5)
nRBC: 0 % (ref 0.0–0.2)

## 2022-08-20 LAB — LACTIC ACID, PLASMA: Lactic Acid, Venous: 2.6 mmol/L (ref 0.5–1.9)

## 2022-08-20 LAB — PROCALCITONIN: Procalcitonin: 0.23 ng/mL

## 2022-08-20 MED ORDER — ASPIRIN 81 MG PO CHEW
81.0000 mg | CHEWABLE_TABLET | Freq: Every day | ORAL | Status: DC
  Administered 2022-08-20 – 2022-08-22 (×3): 81 mg via ORAL
  Filled 2022-08-20 (×3): qty 1

## 2022-08-20 MED ORDER — FERROUS SULFATE 325 (65 FE) MG PO TABS
325.0000 mg | ORAL_TABLET | Freq: Three times a day (TID) | ORAL | Status: DC
  Administered 2022-08-20 – 2022-08-22 (×6): 325 mg via ORAL
  Filled 2022-08-20 (×6): qty 1

## 2022-08-20 MED ORDER — LACTATED RINGERS IV SOLN: INTRAVENOUS | Status: AC

## 2022-08-20 NOTE — Progress Notes (Signed)
PROGRESS NOTE    Marcus [name]  VXB:[id number] DOB: 31-Oct-1940 DOA: 08/18/2022 PCP: London Pepper, MD   Brief Narrative:  Marcus [name] is a 81 y.o. male with medical history significant of CAD, HTN, HLD, OSA, ILD, CKD 3a, chronic diastolic CHF who underwent biopsy of the left parotid mixed cystic and solid lesion done on 08/08/2022 and started having swelling the next day which got worse so he went to see ENT on 08/18/2022 who referred him to the ED for admission for IV antibiotics and possible abscess drainage.  He was admitted to hospital service, ENT was consulted, underwent left parotid abscess drainage by IR.   Assessment & Plan:   Principal Problem:   Parotid abscess Active Problems:   Coronary atherosclerosis of native coronary artery   Benign essential HTN   PVD (peripheral vascular disease) (HCC)   ILD (interstitial lung disease) (HCC)   Chronic diastolic CHF (congestive heart failure) (HCC)   CKD (chronic kidney disease) stage 3, GFR 30-59 ml/min (HCC)   Acute renal failure superimposed on stage 3a chronic kidney disease (HCC)   Lactic acidosis  Left parotid abscess: S/p drainage of the abscess by IR, continue vancomycin and Unasyn and follow culture.  ENT also following and has cleared the patient for discharge.  Patient also received Decadron.  Will need follow-up with Dr. Howard Pouch in the office this week.  CAD: Stable.  Continue Lipitor.  Essential hypertension: Continue Toprol-XL but hold Cozaar given AKI.  PVD: Stable.  ILD: Stable.  Chronic diastolic CHF: Appears to be stable.  AKI on CKD stage IIIa: Baseline creatinine appears to be 1.5, present with 2.22.  Improving.  Likely due to infection.  Continue IV fluids.  Repeat labs today.  DVT prophylaxis: SCDs Start: 08/18/22 2159   Code Status: Full Code  Family Communication:  None present at bedside.  Plan of care discussed with patient in length and he/she verbalized understanding and agreed with  it.  Status is: Inpatient Remains inpatient appropriate because: Needs IV antibiotics and need to wait for the final culture report to tailor antibiotics.   Estimated body mass index is 33.62 kg/m as calculated from the following:   Height as of this encounter: _0  (1.905 m).   Weight as of this encounter: 122 kg.    Nutritional Assessment: Body mass index is 33.62 kg/m.Marland Kitchen Seen by dietician.  I agree with the assessment and plan as outlined below: Nutrition Status:        . Skin Assessment: I have examined the patient's skin and I agree with the wound assessment as performed by the wound care RN as outlined below:    Consultants:  ENT and IR  Procedures:  As above  Antimicrobials:  Anti-infectives (From admission, onward)    Start     Dose/Rate Route Frequency Ordered Stop   08/20/22 1200  vancomycin (VANCOREADY) IVPB 2000 mg/400 mL  Status:  Discontinued        2,000 mg 200 mL/hr over 120 Minutes Intravenous Every 48 hours 08/18/22 1729 08/19/22 1036   08/19/22 1800  vancomycin (VANCOREADY) IVPB 1250 mg/250 mL        1,250 mg 166.7 mL/hr over 90 Minutes Intravenous Every 24 hours 08/19/22 1036     08/19/22 0000  Ampicillin-Sulbactam (UNASYN) 3 g in sodium chloride 0.9 % 100 mL IVPB        3 g 200 mL/hr over 30 Minutes Intravenous Every 6 hours 08/18/22 1926     08/18/22 1715  vancomycin (VANCOCIN)  IVPB 1000 mg/200 mL premix       See Hyperspace for full Linked Orders Report.   1,000 mg 200 mL/hr over 60 Minutes Intravenous  Once 08/18/22 1704 08/18/22 2254   08/18/22 1715  vancomycin (VANCOCIN) IVPB 1000 mg/200 mL premix  Status:  Discontinued       See Hyperspace for full Linked Orders Report.   1,000 mg 200 mL/hr over 60 Minutes Intravenous  Once 08/18/22 1704 08/19/22 1036   08/18/22 1630  Ampicillin-Sulbactam (UNASYN) 3 g in sodium chloride 0.9 % 100 mL IVPB        3 g 200 mL/hr over 30 Minutes Intravenous  Once 08/18/22 1628 08/18/22 1901          Subjective: Patient seen and examined.  Still complains of pain and swelling in the left side of the jaw.  No other complaint.  He is fully alert and oriented.  Objective: Vitals:   08/19/22 1926 08/19/22 1930 08/20/22 0539 08/20/22 0958  BP:  137/66 100/64 132/66  Pulse:  69 66 69  Resp:  _0 Temp:   (!) 97.4 F (36.3 C) (!) 97.5 F (36.4 C)  TempSrc:  Oral Oral Oral  SpO2:  98% 97% 97%  Weight: 122 kg     Height: _1  (1.905 m)       Intake/Output Summary (Last 24 hours) at 08/20/2022 1035 Last data filed at 08/20/2022 0800 Gross per 24 hour  Intake 1157.91 ml  Output 0 ml  Net 1157.91 ml   Filed Weights   08/18/22 1700 08/19/22 1926  Weight: (!) 150 kg 122 kg    Examination:  General exam: Appears calm and comfortable, has obvious swelling in the left parotid area and tender to palpation. Respiratory system: Clear to auscultation. Respiratory effort normal. Cardiovascular system: S1 & S2 heard, RRR. No JVD, murmurs, rubs, gallops or clicks. No pedal edema. Gastrointestinal system: Abdomen is nondistended, soft and nontender. No organomegaly or masses felt. Normal bowel sounds heard. Central nervous system: Alert and oriented. No focal neurological deficits. Extremities: Symmetric 5 x 5 power. Skin: No rashes, lesions or ulcers Psychiatry: Judgement and insight appear normal. Mood & affect appropriate.    Data Reviewed: I have personally reviewed following labs and imaging studies  CBC: Recent Labs  Lab 08/18/22 1307 08/18/22 1415 08/19/22 0514  WBC 16.0*  --  13.4*  NEUTROABS 13.6*  --   --   HGB 12.7* 12.9* 12.4*  HCT 38.4* 38.0* 38.2*  MCV 101.3*  --  100.8*  PLT 263  --  937   Basic Metabolic Panel: Recent Labs  Lab 08/18/22 1307 08/18/22 1415 08/18/22 2104 08/19/22 0514  NA 140 141  --  135  K 5.1 5.2*  --  4.5  CL 112* 112*  --  105  CO2 20*  --   --  18*  GLUCOSE 127* 123*  --  154*  BUN 38* 41*  --  36*  CREATININE 2.22* 2.40*   --  2.04*  CALCIUM 10.0  --   --  9.7  MG  --   --  1.3* 1.9  PHOS  --   --  2.8 4.1   GFR: Estimated Creatinine Clearance: 40.6 mL/min (A) (by C-G formula based on SCr of 2.04 mg/dL (H)). Liver Function Tests: Recent Labs  Lab 08/18/22 1307 08/18/22 2104 08/19/22 0514  AST 41 42* 38  ALT 53* 44 45*  ALKPHOS 64 52 57  BILITOT 0.5 1.7* 0.5  PROT 7.1 6.1* 6.9  ALBUMIN 2.7* 2.5* 2.7*   No results for input(s): "LIPASE", "AMYLASE" in the last 168 hours. No results for input(s): "AMMONIA" in the last 168 hours. Coagulation Profile: No results for input(s): "INR", "PROTIME" in the last 168 hours. Cardiac Enzymes: Recent Labs  Lab 08/18/22 2104  CKTOTAL 100   BNP (last 3 results) No results for input(s): "PROBNP" in the last 8760 hours. HbA1C: No results for input(s): "HGBA1C" in the last 72 hours. CBG: No results for input(s): "GLUCAP" in the last 168 hours. Lipid Profile: No results for input(s): "CHOL", "HDL", "LDLCALC", "TRIG", "CHOLHDL", "LDLDIRECT" in the last 72 hours. Thyroid Function Tests: Recent Labs    08/18/22 2104  TSH 2.300   Anemia Panel: No results for input(s): "VITAMINB12", "FOLATE", "FERRITIN", "TIBC", "IRON", "RETICCTPCT" in the last 72 hours. Sepsis Labs: Recent Labs  Lab 08/18/22 1349 08/18/22 2104 08/18/22 2315 08/19/22 0514 08/19/22 2153 08/20/22 0049  PROCALCITON  --   --  0.16 0.21  --  0.23  LATICACIDVEN 2.2* 1.2  --   --  2.6*  --     Recent Results (from the past 240 hour(s))  Aerobic/Anaerobic Culture w Gram Stain (surgical/deep wound)     Status: None (Preliminary result)   Collection Time: 08/19/22 11:33 AM   Specimen: Wound; Abscess  Result Value Ref Range Status   Specimen Description WOUND  Final   Special Requests NONE  Final   Gram Stain   Final    RARE GRAM POSITIVE COCCI IN CLUSTERS ABUNDANT WBC PRESENT, PREDOMINANTLY PMN Performed at [facility], [address] [place], [place] [address]    Culture  PENDING  Incomplete   Report Status PENDING  Incomplete     Radiology Studies: [place] FINE NEEDLE ASP 1ST LESION  Result Date: 08/19/2022 INDICATION: Facial swelling. Recent L parotid cystic mass aspiration and Bx on 08/08/22. EXAM: ULTRASOUND-GUIDED LEFT PAROTID FLUID COLLECTION ASPIRATION COMPARISON:  US soft tissue and CT neck neck, 08/18/2022. IR ultrasound, 08/08/2022 MEDICATIONS: None ANESTHESIA/SEDATION: Local anesthetic was administered. CONTRAST:  None COMPLICATIONS: None immediate. PROCEDURE: Informed written consent was obtained from the patient after a discussion of the risks, benefits and alternatives to treatment. Preprocedural ultrasound scanning demonstrated LEFT parotid fluid collection, measuring up to 2.3 x 2.0 x 1.4 cm. A timeout was performed prior to the initiation of the procedure. The LEFT face and neck was prepped and draped in the usual sterile fashion. The overlying soft tissues were anesthetized with 1% lidocaine with epinephrine. Under direct ultrasound guidance, a 18 gauge trocar needle was advanced into the abscess/fluid collection. Multiple ultrasound images were saved for procedural documentation purposes. Next, approximately 3 mL was aspirated from the collection. A representative sample of aspirated fluid was capped and sent to the laboratory for analysis. The needle was removed and superficial hemostasis was achieved with manual compression. A dressing was placed. The patient tolerated the procedure well without immediate postprocedural complication. IMPRESSION: Successful US guided aspiration of 3 mL of thin purulent fluid from LEFT parotid fluid collection. A representative aspirated sample was sent to the laboratory as requested by the ordering clinical team. Michaelle Birks, MD [facility]iology Electronically Signed   By: Michaelle Birks M.D.   On: 08/19/2022 16:37   US SOFT TISSUE HEAD & NECK (NON-THYROID)  Result Date:  08/18/2022 CLINICAL DATA:  Parotid swelling. EXAM: ULTRASOUND OF HEAD/NECK SOFT TISSUES TECHNIQUE: Ultrasound examination of the head and neck soft tissues was performed in  the area of clinical concern. COMPARISON:  CT neck 08/18/2022 FINDINGS: There is a 2.1 x 1.2 x 2.0 cm well-circumscribed nonvascular low-attenuation/fluid collection in the region of the left parotid gland. There is no surrounding hypervascularity. No calcifications. IMPRESSION: 2.1 x 1.2 x 2.0 cm well-circumscribed nonvascular low-attenuation/fluid collection in the region of the left parotid gland. This may represent a parotid cyst or abscess. Electronically Signed   By: Ronney Asters M.D.   On: 08/18/2022 18:17   CT Soft Tissue Neck Wo Contrast  Result Date: 08/18/2022 CLINICAL DATA:  Soft tissue infection suspected after left parotid biopsy. EXAM: CT NECK WITHOUT CONTRAST TECHNIQUE: Multidetector CT imaging of the neck was performed following the standard protocol without intravenous contrast. RADIATION DOSE REDUCTION: This exam was performed according to the departmental dose-optimization program which includes automated exposure control, adjustment of the mA and/or kV according to patient size and/or use of iterative reconstruction technique. COMPARISON:  CT Face 06/09/22 FINDINGS: Pharynx and larynx: Normal. No mass or swelling. Salivary glands: Redemonstrated left sided intraparotid soft tissue. there is asymmetric soft tissue surrounding the left parotid gland. The right parotid gland is not visualized, possibly surgically absent. The appearance of the submandibular glands is unchanged compared to prior exam. Thyroid: Normal. Lymph nodes: None enlarged or abnormal density. Vascular: Negative. Limited intracranial: Negative. Visualized orbits: Negative. Mastoids and visualized paranasal sinuses: Clear. Skeleton: No acute or aggressive process. Upper chest: Negative. Other: There is a biopsy tract along the posterolateral left neck  (series 3, image 63). There is asymmetric skin thickening and subcutaneous soft tissue stranding in the left neck with asymmetric thickening and stranding of the left platysma and sternocleidomastoid muscle bodies (series 3, image 78,79). IMPRESSION: Asymmetric soft tissue stranding surrounding the left parotid gland with associated left neck skin thickening and subcutaneous soft tissue stranding along the left platysma and sternocleidomastoid muscle body. Findings are favored to be infectious in nature. The lack of IV contrast precludes assessment for an abscess. Electronically Signed   By: Marin Roberts M.D.   On: 08/18/2022 16:37    Scheduled Meds:  atorvastatin  80 mg Oral Daily   dorzolamide-timolol  1 drop Left Eye BID   latanoprost  1 drop Left Eye Daily   lidocaine (PF)  5 mL Intradermal Once   metoprolol succinate  100 mg Oral Daily   tamsulosin  0.4 mg Oral Daily   Continuous Infusions:  ampicillin-sulbactam (UNASYN) IV 3 g (08/20/22 0757)   lactated ringers 100 mL/hr at 08/20/22 0640   vancomycin Stopped (08/20/22 0024)     LOS: 2 days   Darliss Cheney, MD [facility]  08/20/2022, 10:35 AM   *Please note that this is a verbal dictation therefore any spelling or grammatical errors are due to the "[place]" system interpretation.  Please page via [place] and do not message via secure chat for urgent patient care matters. Secure chat can be used for non urgent patient care matters.  How to contact the [facility] Attending or Consulting provider [place] or covering provider during after hours [place], for this patient?  Check the care team in [facility] and look for a) attending/consulting [facility] provider listed and b) the [facility] team listed. Page or secure chat [phone]. Log into [email or web address] and use [place]'s universal password to access. If you do not have the password, please contact the hospital operator. Locate the [facility] provider you are looking for under [facility] and page  to a number that you can be  directly reached. If you still have difficulty reaching the provider, please page the [facility] (Director on Call) for the Hospitalists listed on amion for assistance.

## 2022-08-20 NOTE — Plan of Care (Signed)
  Problem: Clinical Measurements: Goal: Respiratory complications will improve Outcome: Progressing Goal: Cardiovascular complication will be avoided Outcome: Progressing   Problem: Activity: Goal: Risk for activity intolerance will decrease Outcome: Progressing   Problem: Nutrition: Goal: Adequate nutrition will be maintained Outcome: Progressing   Problem: Elimination: Goal: Will not experience complications related to urinary retention Outcome: Progressing

## 2022-08-20 NOTE — Progress Notes (Signed)
Subjective: Some discomfort from the dressing but otherwise doing well.  Objective: Vital signs in last 24 hours: Temp:  [97.4 F (36.3 C)-98 F (36.7 C)] 97.4 F (36.3 C) (12/09 0539) Pulse Rate:  [63-94] 66 (12/09 0539) Resp:  [14-26] 18 (12/09 0539) BP: (100-148)/(46-66) 100/64 (12/09 0539) SpO2:  [97 %-100 %] 97 % (12/09 0539) Weight:  [122 kg] 122 kg (12/08 1926) Weight change: -28 kg Last BM Date : 08/18/22  Intake/Output from previous day: 12/08 0701 - 12/09 0700 In: 1027.1 [P.O.:240; I.V.:336.3; IV Piggyback:450.7] Out: 0  Intake/Output this shift: Total I/O In: 130.9 [I.V.:123.7; IV Piggyback:7.1] Out: -   PHYSICAL EXAM: Awake and alert.  Dressing intact.  No obvious drainage seen on the dressing.  Facial nerve function is normal.  No trismus.  Lab Results: Recent Labs    08/18/22 1307 08/18/22 1415 08/19/22 0514  WBC 16.0*  --  13.4*  HGB 12.7* 12.9* 12.4*  HCT 38.4* 38.0* 38.2*  PLT 263  --  280   BMET Recent Labs    08/18/22 1307 08/18/22 1415 08/19/22 0514  NA 140 141 135  K 5.1 5.2* 4.5  CL 112* 112* 105  CO2 20*  --  18*  GLUCOSE 127* 123* 154*  BUN 38* 41* 36*  CREATININE 2.22* 2.40* 2.04*  CALCIUM 10.0  --  9.7    Studies/Results: [place] FINE NEEDLE ASP 1ST LESION  Result Date: 08/19/2022 INDICATION: Facial swelling. Recent L parotid cystic mass aspiration and Bx on 08/08/22. EXAM: ULTRASOUND-GUIDED LEFT PAROTID FLUID COLLECTION ASPIRATION COMPARISON:  US soft tissue and CT neck neck, 08/18/2022. IR ultrasound, 08/08/2022 MEDICATIONS: None ANESTHESIA/SEDATION: Local anesthetic was administered. CONTRAST:  None COMPLICATIONS: None immediate. PROCEDURE: Informed written consent was obtained from the patient after a discussion of the risks, benefits and alternatives to treatment. Preprocedural ultrasound scanning demonstrated LEFT parotid fluid collection, measuring up to 2.3 x 2.0 x 1.4 cm. A timeout was performed prior to the initiation of the  procedure. The LEFT face and neck was prepped and draped in the usual sterile fashion. The overlying soft tissues were anesthetized with 1% lidocaine with epinephrine. Under direct ultrasound guidance, a 18 gauge trocar needle was advanced into the abscess/fluid collection. Multiple ultrasound images were saved for procedural documentation purposes. Next, approximately 3 mL was aspirated from the collection. A representative sample of aspirated fluid was capped and sent to the laboratory for analysis. The needle was removed and superficial hemostasis was achieved with manual compression. A dressing was placed. The patient tolerated the procedure well without immediate postprocedural complication. IMPRESSION: Successful US guided aspiration of 3 mL of thin purulent fluid from LEFT parotid fluid collection. A representative aspirated sample was sent to the laboratory as requested by the ordering clinical team. Michaelle Birks, MD [facility] Radiology Electronically Signed   By: Michaelle Birks M.D.   On: 08/19/2022 16:37   US SOFT TISSUE HEAD & NECK (NON-THYROID)  Result Date: 08/18/2022 CLINICAL DATA:  Parotid swelling. EXAM: ULTRASOUND OF HEAD/NECK SOFT TISSUES TECHNIQUE: Ultrasound examination of the head and neck soft tissues was performed in the area of clinical concern. COMPARISON:  CT neck 08/18/2022 FINDINGS: There is a 2.1 x 1.2 x 2.0 cm well-circumscribed nonvascular low-attenuation/fluid collection in the region of the left parotid gland. There is no surrounding hypervascularity. No calcifications. IMPRESSION: 2.1 x 1.2 x 2.0 cm well-circumscribed nonvascular low-attenuation/fluid collection in the region of the left parotid gland. This may represent a parotid cyst or abscess. Electronically  Signed   By: Ronney Asters M.D.   On: 08/18/2022 18:17   CT Soft Tissue Neck Wo Contrast  Result Date: 08/18/2022 CLINICAL DATA:  Soft tissue infection suspected after  left parotid biopsy. EXAM: CT NECK WITHOUT CONTRAST TECHNIQUE: Multidetector CT imaging of the neck was performed following the standard protocol without intravenous contrast. RADIATION DOSE REDUCTION: This exam was performed according to the departmental dose-optimization program which includes automated exposure control, adjustment of the mA and/or kV according to patient size and/or use of iterative reconstruction technique. COMPARISON:  CT Face 06/09/22 FINDINGS: Pharynx and larynx: Normal. No mass or swelling. Salivary glands: Redemonstrated left sided intraparotid soft tissue. there is asymmetric soft tissue surrounding the left parotid gland. The right parotid gland is not visualized, possibly surgically absent. The appearance of the submandibular glands is unchanged compared to prior exam. Thyroid: Normal. Lymph nodes: None enlarged or abnormal density. Vascular: Negative. Limited intracranial: Negative. Visualized orbits: Negative. Mastoids and visualized paranasal sinuses: Clear. Skeleton: No acute or aggressive process. Upper chest: Negative. Other: There is a biopsy tract along the posterolateral left neck (series 3, image 63). There is asymmetric skin thickening and subcutaneous soft tissue stranding in the left neck with asymmetric thickening and stranding of the left platysma and sternocleidomastoid muscle bodies (series 3, image 78,79). IMPRESSION: Asymmetric soft tissue stranding surrounding the left parotid gland with associated left neck skin thickening and subcutaneous soft tissue stranding along the left platysma and sternocleidomastoid muscle body. Findings are favored to be infectious in nature. The lack of IV contrast precludes assessment for an abscess. Electronically Signed   By: Marin Roberts M.D.   On: 08/18/2022 16:37    Medications: I have reviewed the patient's current medications.  Assessment/Plan: Stable post aspiration of parotid abscess.  Pressure dressing will remain in place  for a few days.  If he is medically stable he may be discharged home and will follow-up with Dr. Howard Pouch in the office this week.  LOS: 2 days    Medical Decision Making: #/Complex Problems: 2  Data Reviewed:1  Management:2 (1-Straightforward, 2-Low, 3-Moderate, 4-High)   Marcus [name] 08/20/2022, 8:56 AM

## 2022-08-21 DIAGNOSIS — K113 Abscess of salivary gland: Secondary | ICD-10-CM | POA: Diagnosis not present

## 2022-08-21 LAB — CBC WITH DIFFERENTIAL/PLATELET
Abs Immature Granulocytes: 0.13 10*3/uL — ABNORMAL HIGH (ref 0.00–0.07)
Basophils Absolute: 0.1 10*3/uL (ref 0.0–0.1)
Basophils Relative: 1 %
Eosinophils Absolute: 0.2 10*3/uL (ref 0.0–0.5)
Eosinophils Relative: 2 %
HCT: 33.9 % — ABNORMAL LOW (ref 39.0–52.0)
Hemoglobin: 11.1 g/dL — ABNORMAL LOW (ref 13.0–17.0)
Immature Granulocytes: 1 %
Lymphocytes Relative: 16 %
Lymphs Abs: 1.7 10*3/uL (ref 0.7–4.0)
MCH: 32.6 pg (ref 26.0–34.0)
MCHC: 32.7 g/dL (ref 30.0–36.0)
MCV: 99.7 fL (ref 80.0–100.0)
Monocytes Absolute: 1.1 10*3/uL — ABNORMAL HIGH (ref 0.1–1.0)
Monocytes Relative: 11 %
Neutro Abs: 7.3 10*3/uL (ref 1.7–7.7)
Neutrophils Relative %: 69 %
Platelets: 264 10*3/uL (ref 150–400)
RBC: 3.4 MIL/uL — ABNORMAL LOW (ref 4.22–5.81)
RDW: 14 % (ref 11.5–15.5)
WBC: 10.5 10*3/uL (ref 4.0–10.5)
nRBC: 0 % (ref 0.0–0.2)

## 2022-08-21 LAB — BASIC METABOLIC PANEL
Anion gap: 8 (ref 5–15)
BUN: 35 mg/dL — ABNORMAL HIGH (ref 8–23)
CO2: 23 mmol/L (ref 22–32)
Calcium: 9.4 mg/dL (ref 8.9–10.3)
Chloride: 109 mmol/L (ref 98–111)
Creatinine, Ser: 1.74 mg/dL — ABNORMAL HIGH (ref 0.61–1.24)
GFR, Estimated: 39 mL/min — ABNORMAL LOW (ref >60)
Glucose, Bld: 103 mg/dL — ABNORMAL HIGH (ref 70–99)
Potassium: 4.5 mmol/L (ref 3.5–5.1)
Sodium: 140 mmol/L (ref 135–145)

## 2022-08-21 LAB — LACTIC ACID, PLASMA
Lactic Acid, Venous: 0.9 mmol/L (ref 0.5–1.9)
Lactic Acid, Venous: 0.9 mmol/L (ref 0.5–1.9)

## 2022-08-21 NOTE — Progress Notes (Signed)
PROGRESS NOTE    Marcus [name]  WUJ:[id number] DOB: 03/09/1941 DOA: 08/18/2022 PCP: London Pepper, MD   Brief Narrative:  Marcus [name] is a 81 y.o. male with medical history significant of CAD, HTN, HLD, OSA, ILD, CKD 3a, chronic diastolic CHF who underwent biopsy of the left parotid mixed cystic and solid lesion done on 08/08/2022 and started having swelling the next day which got worse so he went to see ENT on 08/18/2022 who referred him to the ED for admission for IV antibiotics and possible abscess drainage.  He was admitted to hospital service, ENT was consulted, underwent left parotid abscess drainage by IR.   Assessment & Plan:   Principal Problem:   Parotid abscess Active Problems:   Coronary atherosclerosis of native coronary artery   Benign essential HTN   PVD (peripheral vascular disease) (HCC)   ILD (interstitial lung disease) (HCC)   Chronic diastolic CHF (congestive heart failure) (HCC)   CKD (chronic kidney disease) stage 3, GFR 30-59 ml/min (HCC)   Acute renal failure superimposed on stage 3a chronic kidney disease (HCC)   Lactic acidosis  Left parotid abscess: S/p drainage of the abscess by IR, cultures growing gram-positive cocci in clusters, final identification and sensitivities pending, in the meantime continue vancomycin and Unasyn and follow culture.  ENT also following and has cleared the patient for discharge.  Patient also received Decadron.  Will need follow-up with Dr. Howard Pouch in the office this week.  CAD: Stable.  Continue Lipitor.  Essential hypertension: Continue Toprol-XL but hold Cozaar given AKI.  PVD: Stable.  ILD: Stable.  Chronic diastolic CHF: Appears to be stable.  AKI on CKD stage IIIa: Labs pending for this morning.  DVT prophylaxis: SCDs Start: 08/18/22 2159   Code Status: Full Code  Family Communication:  None present at bedside.  Plan of care discussed with patient in length and he/she verbalized understanding and agreed with  it.  Status is: Inpatient Remains inpatient appropriate because: Needs IV antibiotics and need to wait for the final culture report to tailor antibiotics.   Estimated body mass index is 33.62 kg/m as calculated from the following:   Height as of this encounter: _0  (1.905 m).   Weight as of this encounter: 122 kg.    Nutritional Assessment: Body mass index is 33.62 kg/m.Marland Kitchen Seen by dietician.  I agree with the assessment and plan as outlined below: Nutrition Status:        . Skin Assessment: I have examined the patient's skin and I agree with the wound assessment as performed by the wound care RN as outlined below:    Consultants:  ENT and IR  Procedures:  As above  Antimicrobials:  Anti-infectives (From admission, onward)    Start     Dose/Rate Route Frequency Ordered Stop   08/20/22 1200  vancomycin (VANCOREADY) IVPB 2000 mg/400 mL  Status:  Discontinued        2,000 mg 200 mL/hr over 120 Minutes Intravenous Every 48 hours 08/18/22 1729 08/19/22 1036   08/19/22 1800  vancomycin (VANCOREADY) IVPB 1250 mg/250 mL        1,250 mg 166.7 mL/hr over 90 Minutes Intravenous Every 24 hours 08/19/22 1036     08/19/22 0000  Ampicillin-Sulbactam (UNASYN) 3 g in sodium chloride 0.9 % 100 mL IVPB        3 g 200 mL/hr over 30 Minutes Intravenous Every 6 hours 08/18/22 1926     08/18/22 1715  vancomycin (VANCOCIN) IVPB 1000 mg/200 mL premix  See Hyperspace for full Linked Orders Report.   1,000 mg 200 mL/hr over 60 Minutes Intravenous  Once 08/18/22 1704 08/18/22 2254   08/18/22 1715  vancomycin (VANCOCIN) IVPB 1000 mg/200 mL premix  Status:  Discontinued       See Hyperspace for full Linked Orders Report.   1,000 mg 200 mL/hr over 60 Minutes Intravenous  Once 08/18/22 1704 08/19/22 1036   08/18/22 1630  Ampicillin-Sulbactam (UNASYN) 3 g in sodium chloride 0.9 % 100 mL IVPB        3 g 200 mL/hr over 30 Minutes Intravenous  Once 08/18/22 1628 08/18/22 1901          Subjective:  Seen and examined.  Still complains of the pain in the jaw.  No new complaint.  Objective: Vitals:   08/20/22 0958 08/20/22 1640 08/20/22 2110 08/21/22 0521  BP: 132/66 138/68 (!) 127/58 (!) 147/55  Pulse: 69 61 (!) 59 64  Resp: _0 Temp: (!) 97.5 F (36.4 C) 97.9 F (36.6 C) 98.2 F (36.8 C) 98.1 F (36.7 C)  TempSrc: Oral Oral Oral Oral  SpO2: 97% 100% 100% 100%  Weight:      Height:        Intake/Output Summary (Last 24 hours) at 08/21/2022 0958 Last data filed at 08/21/2022 0820 Gross per 24 hour  Intake 1443.84 ml  Output 0 ml  Net 1443.84 ml    Filed Weights   08/18/22 1700 08/19/22 1926  Weight: (!) 150 kg 122 kg    Examination: General exam: Appears calm and comfortable has bandage and swelling in the left which is tender to palpation. Respiratory system: Clear to auscultation. Respiratory effort normal. Cardiovascular system: S1 & S2 heard, RRR. No JVD, murmurs, rubs, gallops or clicks. No pedal edema. Gastrointestinal system: Abdomen is nondistended, soft and nontender. No organomegaly or masses felt. Normal bowel sounds heard. Central nervous system: Alert and oriented. No focal neurological deficits. Extremities: Symmetric 5 x 5 power. Skin: No rashes, lesions or ulcers.  Psychiatry: Judgement and insight appear normal. Mood & affect appropriate.    Data Reviewed: I have personally reviewed following labs and imaging studies  CBC: Recent Labs  Lab 08/18/22 1307 08/18/22 1415 08/19/22 0514 08/20/22 0049  WBC 16.0*  --  13.4* 18.3*  NEUTROABS 13.6*  --   --  15.3*  HGB 12.7* 12.9* 12.4* 11.2*  HCT 38.4* 38.0* 38.2* 32.5*  MCV 101.3*  --  100.8* 97.6  PLT 263  --  280 504    Basic Metabolic Panel: Recent Labs  Lab 08/18/22 1307 08/18/22 1415 08/18/22 2104 08/19/22 0514 08/20/22 0049  NA 140 141  --  135 137  K 5.1 5.2*  --  4.5 4.6  CL 112* 112*  --  105 108  CO2 20*  --   --  18* 17*  GLUCOSE 127* 123*  --   154* 105*  BUN 38* 41*  --  36* 46*  CREATININE 2.22* 2.40*  --  2.04* 1.87*  CALCIUM 10.0  --   --  9.7 9.6  MG  --   --  1.3* 1.9  --   PHOS  --   --  2.8 4.1  --     GFR: Estimated Creatinine Clearance: 44.3 mL/min (A) (by C-G formula based on SCr of 1.87 mg/dL (H)). Liver Function Tests: Recent Labs  Lab 08/18/22 1307 08/18/22 2104 08/19/22 0514  AST 41 42* 38  ALT 53* 44 45*  ALKPHOS  64 52 57  BILITOT 0.5 1.7* 0.5  PROT 7.1 6.1* 6.9  ALBUMIN 2.7* 2.5* 2.7*    No results for input(s): "LIPASE", "AMYLASE" in the last 168 hours. No results for input(s): "AMMONIA" in the last 168 hours. Coagulation Profile: No results for input(s): "INR", "PROTIME" in the last 168 hours. Cardiac Enzymes: Recent Labs  Lab 08/18/22 2104  CKTOTAL 100    BNP (last 3 results) No results for input(s): "PROBNP" in the last 8760 hours. HbA1C: No results for input(s): "HGBA1C" in the last 72 hours. CBG: No results for input(s): "GLUCAP" in the last 168 hours. Lipid Profile: No results for input(s): "CHOL", "HDL", "LDLCALC", "TRIG", "CHOLHDL", "LDLDIRECT" in the last 72 hours. Thyroid Function Tests: Recent Labs    08/18/22 2104  TSH 2.300    Anemia Panel: No results for input(s): "VITAMINB12", "FOLATE", "FERRITIN", "TIBC", "IRON", "RETICCTPCT" in the last 72 hours. Sepsis Labs: Recent Labs  Lab 08/18/22 1349 08/18/22 2104 08/18/22 2315 08/19/22 0514 08/19/22 2153 08/20/22 0049  PROCALCITON  --   --  0.16 0.21  --  0.23  LATICACIDVEN 2.2* 1.2  --   --  2.6*  --      Recent Results (from the past 240 hour(s))  Aerobic/Anaerobic Culture w Gram Stain (surgical/deep wound)     Status: None (Preliminary result)   Collection Time: 08/19/22 11:33 AM   Specimen: Wound; Abscess  Result Value Ref Range Status   Specimen Description WOUND  Final   Special Requests NONE  Final   Gram Stain   Final    RARE GRAM POSITIVE COCCI IN CLUSTERS ABUNDANT WBC PRESENT, PREDOMINANTLY PMN     Culture   Final    FEW STAPHYLOCOCCUS AUREUS SUSCEPTIBILITIES TO FOLLOW Performed at [facility], [place] [address] [place], [place] [address]    Report Status PENDING  Incomplete     Radiology Studies: [place] FINE NEEDLE ASP 1ST LESION  Result Date: 08/19/2022 INDICATION: Facial swelling. Recent L parotid cystic mass aspiration and Bx on 08/08/22. EXAM: ULTRASOUND-GUIDED LEFT PAROTID FLUID COLLECTION ASPIRATION COMPARISON:  US soft tissue and CT neck neck, 08/18/2022. IR ultrasound, 08/08/2022 MEDICATIONS: None ANESTHESIA/SEDATION: Local anesthetic was administered. CONTRAST:  None COMPLICATIONS: None immediate. PROCEDURE: Informed written consent was obtained from the patient after a discussion of the risks, benefits and alternatives to treatment. Preprocedural ultrasound scanning demonstrated LEFT parotid fluid collection, measuring up to 2.3 x 2.0 x 1.4 cm. A timeout was performed prior to the initiation of the procedure. The LEFT face and neck was prepped and draped in the usual sterile fashion. The overlying soft tissues were anesthetized with 1% lidocaine with epinephrine. Under direct ultrasound guidance, a 18 gauge trocar needle was advanced into the abscess/fluid collection. Multiple ultrasound images were saved for procedural documentation purposes. Next, approximately 3 mL was aspirated from the collection. A representative sample of aspirated fluid was capped and sent to the laboratory for analysis. The needle was removed and superficial hemostasis was achieved with manual compression. A dressing was placed. The patient tolerated the procedure well without immediate postprocedural complication. IMPRESSION: Successful US guided aspiration of 3 mL of thin purulent fluid from LEFT parotid fluid collection. A representative aspirated sample was sent to the laboratory as requested by the ordering clinical team. Michaelle Birks, MD [facility]  Radiology Electronically Signed   By: Michaelle Birks M.D.   On: 08/19/2022 16:37    Scheduled Meds:  aspirin  81 mg Oral Daily  atorvastatin  80 mg Oral Daily   dorzolamide-timolol  1 drop Left Eye BID   ferrous sulfate  325 mg Oral TID WC   latanoprost  1 drop Left Eye Daily   lidocaine (PF)  5 mL Intradermal Once   metoprolol succinate  100 mg Oral Daily   tamsulosin  0.4 mg Oral Daily   Continuous Infusions:  ampicillin-sulbactam (UNASYN) IV Stopped (08/21/22 0721)   vancomycin Stopped (08/20/22 1852)     LOS: 3 days   Darliss Cheney, MD [facility]  08/21/2022, 9:58 AM   *Please note that this is a verbal dictation therefore any spelling or grammatical errors are due to the "[place] One" system interpretation.  Please page via [place] and do not message via secure chat for urgent patient care matters. Secure chat can be used for non urgent patient care matters.  How to contact the [facility] Attending or Consulting provider [place] or covering provider during after hours [place], for this patient?  Check the care team in [facility] and look for a) attending/consulting [facility] provider listed and b) the [facility] team listed. Page or secure chat [phone]. Log into [email or web address] and use [place]'s universal password to access. If you do not have the password, please contact the hospital operator. Locate the [facility] provider you are looking for under [facility] and page to a number that you can be directly reached. If you still have difficulty reaching the provider, please page the [facility] (Director on Call) for the Hospitalists listed on amion for assistance.

## 2022-08-21 NOTE — Evaluation (Signed)
Occupational Therapy Evaluation Patient Details Name: Marcus [name] MRN: [id number] DOB: 03-14-1941 Today's Date: 08/21/2022   History of Present Illness Pt is an 81 y/o M presenting to ED on 12/7 from physician office with facial pain, swelling, drainage, and light headedness x several days. Underwent L parotid mixed cyst and solid lesion on 11/27. Underwent L parotid abscess drainage by IR. PMH includes dyslipidemia, glaucoma, gout, HLD, HTN, LAD, visual abnormalities, OSA, and CKD IIIa.   Clinical Impression   PTA, pt ind with ADLs and mobility, lives with spouse who can assist at d/c. Pt currently mod I for all aspects of ADLs and mobility, reports mild blurriness of vision at baseline since glaucoma surgery; plan for eyelid surgery in the next month or so. Pt presenting with impairments listed below, however has no acute OT needs at this time, will s/o. Please reconsult if there is a change in pt status.     Recommendations for follow up therapy are one component of a multi-disciplinary discharge planning process, led by the attending physician.  Recommendations may be updated based on patient status, additional functional criteria and insurance authorization.   Follow Up Recommendations  No OT follow up     Assistance Recommended at Discharge Set up Supervision/Assistance  Patient can return home with the following Assist for transportation;Assistance with cooking/housework    Functional Status Assessment  Patient has had a recent decline in their functional status and demonstrates the ability to make significant improvements in function in a reasonable and predictable amount of time.  Equipment Recommendations  None recommended by OT    Recommendations for Other Services PT consult     Precautions / Restrictions Restrictions Weight Bearing Restrictions: No      Mobility Bed Mobility Overal bed mobility: Modified Independent                  Transfers Overall  transfer level: Modified independent Equipment used: None                      Balance Overall balance assessment: Mild deficits observed, not formally tested                                         ADL either performed or assessed with clinical judgement   ADL Overall ADL's : Modified independent                                       General ADL Comments: performing UB/LB dressing, shower transfer, toilet transfer, and hallway ambulation with mod I     Vision Baseline Vision/History: 3 Glaucoma Additional Comments: reports wearing reading glassess since cataract surgery ~ 5years ago; reports glaucoma surgery on R eye has blurred vision some; reports will have surgery for eyelid     Perception Perception Perception Tested?: No   Praxis Praxis Praxis tested?: Not tested    Pertinent Vitals/Pain Pain Assessment Pain Assessment: Faces Faces Pain Scale: Hurts a little bit Pain Location: head from bandage Pain Descriptors / Indicators: Discomfort Pain Intervention(s): Monitored during session     Hand Dominance Right   Extremity/Trunk Assessment Upper Extremity Assessment Upper Extremity Assessment: Overall WFL for tasks assessed   Lower Extremity Assessment Lower Extremity Assessment: Overall WFL for tasks assessed (reports neuropathy at baseline)  Cervical / Trunk Assessment Cervical / Trunk Assessment: Normal   Communication Communication Communication: No difficulties   Cognition Arousal/Alertness: Awake/alert                                           General Comments  spouse present and supportive    Exercises     Shoulder Instructions      Home Living Family/patient expects to be discharged to:: Private residence Living Arrangements: Spouse/significant other Available Help at Discharge: Family;Available 24 hours/day Type of Home: Other(Comment) (townhome) Home Access: Stairs to  enter [employer] of Steps: 1   Home Layout: One level     Bathroom Shower/Tub: Tub/shower unit;Walk-in shower         Home Equipment: Conservation officer, nature (2 wheels);Shower seat (in storage)   Additional Comments: plans to go to home in [place] within the next week or so,      Prior Functioning/Environment Prior Level of Function : Independent/Modified Independent;Driving             Mobility Comments: ind without AD ADLs Comments: ind        OT Problem List: Decreased activity tolerance;Impaired balance (sitting and/or standing)      OT Treatment/Interventions:      OT Goals(Current goals can be found in the care plan section) Acute Rehab OT Goals Patient Stated Goal: none stated OT Goal Formulation: With patient Time For Goal Achievement: 09/04/22 Potential to Achieve Goals: Good  OT Frequency:      Co-evaluation              AM-PAC OT "6 Clicks" Daily Activity     Outcome Measure Help from another person eating meals?: None Help from another person taking care of personal grooming?: None Help from another person toileting, which includes using toliet, bedpan, or urinal?: None Help from another person bathing (including washing, rinsing, drying)?: None Help from another person to put on and taking off regular upper body clothing?: None Help from another person to put on and taking off regular lower body clothing?: None 6 Click Score: 24   End of Session Nurse Communication: Mobility status  Activity Tolerance: Patient tolerated treatment well Patient left: in bed;with call bell/phone within reach;with family/visitor present;with nursing/sitter in room  OT Visit Diagnosis: Muscle weakness (generalized) (M62.81)                Time: 3672-5500 OT Time Calculation (min): 20 min Charges:  OT General Charges $OT Visit: 1 Visit OT Evaluation $OT Eval Low Complexity: 1 Low  Renaye Rakers, OTD, OTR/L SecureChat Preferred Acute Rehab ([phone]  [phone]   Latitia Housewright K Koonce 08/21/2022, 12:32 PM

## 2022-08-21 NOTE — Progress Notes (Signed)
PT Cancellation Note  Patient Details Name: Marcus [name] MRN: [id number] DOB: [birth year]   Cancelled Treatment:    Reason Eval/Treat Not Completed: PT screened, no needs identified, will sign off. See Occupational Therapy Evaluation notes for further details.  Mabeline Caras, PT, DPT Acute Rehabilitation Services  Personal: [facility] Office: [place] 08/21/2022, 12:24 PM

## 2022-08-21 NOTE — Plan of Care (Signed)
  Problem: Clinical Measurements: Goal: Respiratory complications will improve Outcome: Progressing Goal: Cardiovascular complication will be avoided Outcome: Progressing   Problem: Activity: Goal: Risk for activity intolerance will decrease Outcome: Progressing   Problem: Nutrition: Goal: Adequate nutrition will be maintained Outcome: Progressing

## 2022-08-22 ENCOUNTER — Telehealth: Payer: Self-pay | Admitting: Cardiology

## 2022-08-22 ENCOUNTER — Other Ambulatory Visit (HOSPITAL_COMMUNITY): Payer: Self-pay

## 2022-08-22 DIAGNOSIS — K113 Abscess of salivary gland: Secondary | ICD-10-CM | POA: Diagnosis not present

## 2022-08-22 MED ORDER — AMOXICILLIN-POT CLAVULANATE 875-125 MG PO TABS
1.0000 | ORAL_TABLET | Freq: Two times a day (BID) | ORAL | 0 refills | Status: AC
  Filled 2022-08-22: qty 20, 10d supply, fill #0

## 2022-08-22 NOTE — Telephone Encounter (Signed)
Called pt who reports was taken off amlodipine and losartan.  Has 3-4 months of medication at home and wants to know what to do with med.   Advised pt not to take medications.  Suggested pt take BP/HR daily for 2 weeks write down readings and call in or send readings in through my chart for MD to review. Reiterated above to pt numerous times to pt as pt is concerned that has 3-4 months of medication.  Advised pt not to take medication could possibly cause BP to drop dangerously low.  Advised if  BP is consistently above 135/85 to call into the office to notify MD. Pt expresses understanding.  Advised pt to call back with any further questions or concerns.

## 2022-08-22 NOTE — Care Management Important Message (Signed)
Important Message  Patient Details  Name: Reynard Christoffersen MRN: [id number] Date of Birth: August 19, 1941   Medicare Important Message Given:  Yes  Patient left prior to IM delivery will mail to the patient home address.    Magdelene Ruark 08/22/2022, 3:11 PM

## 2022-08-22 NOTE — Discharge Summary (Signed)
Physician Discharge Summary  Marcus [name] FGH:[id number] DOB: 04-03-1941 DOA: 08/18/2022  PCP: London Pepper, MD  Admit date: 08/18/2022 Discharge date: 08/22/2022 30 Day Unplanned Readmission Risk Score    Flowsheet Row ED to Hosp-Admission (Current) from 08/18/2022 in [facility] 73M KIDNEY UNIT  30 Day Unplanned Readmission Risk Score (%) 11.82 Filed at 08/22/2022 0801       This score is the patient's risk of an unplanned readmission within 30 days of being discharged (0 -100%). The score is based on dignosis, age, lab data, medications, orders, and past utilization.   Low:  0-14.9   Medium: 15-21.9   High: 22-29.9   Extreme: 30 and above          Admitted From: Home Disposition: Home  Recommendations for Outpatient Follow-up:  Follow up with PCP in 1-2 weeks Please obtain BMP/CBC in one week Follow-up with ENT/Dr. Hershel in 1 to 2 days.  Please call their office. Please follow up with your PCP on the following pending results: Unresulted Labs (From admission, onward)    None         Home Health: None Equipment/Devices: None  Discharge Condition: None CODE STATUS: Full code Diet recommendation: Take  Subjective: Seen and examined.  He feels well.  He is comfortable going home.  He understands about taking more antibiotics and following up with ENT.  Brief/Interim Summary: Marcus [name] is a 81 y.o. male with medical history significant of CAD, HTN, HLD, OSA, ILD, CKD 3a, chronic diastolic CHF who underwent biopsy of the left parotid mixed cystic and solid lesion done on 08/08/2022 and started having swelling the next day which got worse so he went to see ENT on 08/18/2022 who referred him to the ED for admission for IV antibiotics and possible abscess drainage.  He was admitted to hospital service, ENT was consulted, underwent left parotid abscess drainage by IR.  He was started on Zosyn and vancomycin.  Cultures growing MSSA, sensitive to everything, he is  cleared by ENT for discharge.  Discussed with ID over the phone, we are sending him home on 10 more days of oral Augmentin.  He has dressing still in place.  He is to call the ENT office today about further instructions about that.   CAD: Stable.  Continue Lipitor.   Essential hypertension: Blood pressure controlled despite of holding his Cozaar, based on this, we are discontinuing his Cozaar but resuming Toprol-XL.   PVD: Stable.   ILD: Stable.   Chronic diastolic CHF: Appears to be stable.   AKI on CKD stage IIIa: Now back at his baseline.  Discharge plan was discussed with patient and/or family member and they verbalized understanding and agreed with it.  Discharge Diagnoses:  Principal Problem:   Parotid abscess Active Problems:   Coronary atherosclerosis of native coronary artery   Benign essential HTN   PVD (peripheral vascular disease) (HCC)   ILD (interstitial lung disease) (HCC)   Chronic diastolic CHF (congestive heart failure) (HCC)   CKD (chronic kidney disease) stage 3, GFR 30-59 ml/min (HCC)   Acute renal failure superimposed on stage 3a chronic kidney disease (HCC)   Lactic acidosis    Discharge Instructions   Allergies as of 08/22/2022       Reactions   Pirfenidone Rash   Ofev [nintedanib] Diarrhea        Medication List     STOP taking these medications    losartan 100 MG tablet Commonly known as: COZAAR   minocycline  100 MG capsule Commonly known as: MINOCIN       TAKE these medications    allopurinol 300 MG tablet Commonly known as: ZYLOPRIM Take 300 mg by mouth daily.   amoxicillin-clavulanate 875-125 MG tablet Commonly known as: AUGMENTIN Take 1 tablet by mouth 2 (two) times daily for 10 days.   aspirin 81 MG tablet Take 81 mg by mouth daily.   atorvastatin 80 MG tablet Commonly known as: LIPITOR Take 1 tablet (80 mg total) by mouth daily.   dorzolamide-timolol 2-0.5 % ophthalmic solution Commonly known as: COSOPT Place 1  drop into the left eye 2 (two) times daily.   ferrous sulfate 325 (65 FE) MG EC tablet Take 325 mg by mouth 3 (three) times daily with meals.   fluticasone 50 MCG/ACT nasal spray Commonly known as: FLONASE Place 2 sprays into both nostrils as needed.   latanoprost 0.005 % ophthalmic solution Commonly known as: XALATAN Place 1 drop into the left eye daily.   metoprolol succinate 100 MG 24 hr tablet Commonly known as: TOPROL-XL Take with or immediately following a meal.   MUCINEX DM PO Take 1 tablet by mouth daily.   nitroGLYCERIN 0.4 MG SL tablet Commonly known as: NITROSTAT Place 0.4 mg under the tongue every 5 (five) minutes as needed for chest pain.   OMEGA 3 PO Take 2 capsules by mouth in the morning and at bedtime. 2000 mg BID   pimecrolimus 1 % cream Commonly known as: ELIDEL Apply 1 Application topically 2 (two) times daily. To affected area on the face   Rocklatan 0.02-0.005 % Soln Generic drug: Netarsudil-Latanoprost As directed in left eye   tamsulosin 0.4 MG Caps capsule Commonly known as: FLOMAX Take 0.4 mg by mouth daily.   VITAMIN B-12 PO Take 1 capsule by mouth daily.        Follow-up Information     London Pepper, MD Follow up in 1 week(s).   Specialty: Family Medicine Contact information: [address] [place] [address] [phone]         ENT/DR. HERSHEL Follow up in 2 day(s).                 Allergies  Allergen Reactions   Pirfenidone Rash   Ofev [Nintedanib] Diarrhea    Consultations: ENT   Procedures/Studies: [place] FINE NEEDLE ASP 1ST LESION  Result Date: 08/19/2022 INDICATION: Facial swelling. Recent L parotid cystic mass aspiration and Bx on 08/08/22. EXAM: ULTRASOUND-GUIDED LEFT PAROTID FLUID COLLECTION ASPIRATION COMPARISON:  US soft tissue and CT neck neck, 08/18/2022. IR ultrasound, 08/08/2022 MEDICATIONS: None ANESTHESIA/SEDATION: Local anesthetic was administered. CONTRAST:  None  COMPLICATIONS: None immediate. PROCEDURE: Informed written consent was obtained from the patient after a discussion of the risks, benefits and alternatives to treatment. Preprocedural ultrasound scanning demonstrated LEFT parotid fluid collection, measuring up to 2.3 x 2.0 x 1.4 cm. A timeout was performed prior to the initiation of the procedure. The LEFT face and neck was prepped and draped in the usual sterile fashion. The overlying soft tissues were anesthetized with 1% lidocaine with epinephrine. Under direct ultrasound guidance, a 18 gauge trocar needle was advanced into the abscess/fluid collection. Multiple ultrasound images were saved for procedural documentation purposes. Next, approximately 3 mL was aspirated from the collection. A representative sample of aspirated fluid was capped and sent to the laboratory for analysis. The needle was removed and superficial hemostasis was achieved with manual compression. A dressing was placed. The patient tolerated the procedure well  without immediate postprocedural complication. IMPRESSION: Successful US guided aspiration of 3 mL of thin purulent fluid from LEFT parotid fluid collection. A representative aspirated sample was sent to the laboratory as requested by the ordering clinical team. Michaelle Birks, MD [facility] Electronically Signed   By: Michaelle Birks M.D.   On: 08/19/2022 16:37   US SOFT TISSUE HEAD & NECK (NON-THYROID)  Result Date: 08/18/2022 CLINICAL DATA:  Parotid swelling. EXAM: ULTRASOUND OF HEAD/NECK SOFT TISSUES TECHNIQUE: Ultrasound examination of the head and neck soft tissues was performed in the area of clinical concern. COMPARISON:  CT neck 08/18/2022 FINDINGS: There is a 2.1 x 1.2 x 2.0 cm well-circumscribed nonvascular low-attenuation/fluid collection in the region of the left parotid gland. There is no surrounding hypervascularity. No calcifications. IMPRESSION: 2.1 x 1.2 x 2.0 cm  well-circumscribed nonvascular low-attenuation/fluid collection in the region of the left parotid gland. This may represent a parotid cyst or abscess. Electronically Signed   By: Ronney Asters M.D.   On: 08/18/2022 18:17   CT Soft Tissue Neck Wo Contrast  Result Date: 08/18/2022 CLINICAL DATA:  Soft tissue infection suspected after left parotid biopsy. EXAM: CT NECK WITHOUT CONTRAST TECHNIQUE: Multidetector CT imaging of the neck was performed following the standard protocol without intravenous contrast. RADIATION DOSE REDUCTION: This exam was performed according to the departmental dose-optimization program which includes automated exposure control, adjustment of the mA and/or kV according to patient size and/or use of iterative reconstruction technique. COMPARISON:  CT Face 06/09/22 FINDINGS: Pharynx and larynx: Normal. No mass or swelling. Salivary glands: Redemonstrated left sided intraparotid soft tissue. there is asymmetric soft tissue surrounding the left parotid gland. The right parotid gland is not visualized, possibly surgically absent. The appearance of the submandibular glands is unchanged compared to prior exam. Thyroid: Normal. Lymph nodes: None enlarged or abnormal density. Vascular: Negative. Limited intracranial: Negative. Visualized orbits: Negative. Mastoids and visualized paranasal sinuses: Clear. Skeleton: No acute or aggressive process. Upper chest: Negative. Other: There is a biopsy tract along the posterolateral left neck (series 3, image 63). There is asymmetric skin thickening and subcutaneous soft tissue stranding in the left neck with asymmetric thickening and stranding of the left platysma and sternocleidomastoid muscle bodies (series 3, image 78,79). IMPRESSION: Asymmetric soft tissue stranding surrounding the left parotid gland with associated left neck skin thickening and subcutaneous soft tissue stranding along the left platysma and sternocleidomastoid muscle body. Findings are  favored to be infectious in nature. The lack of IV contrast precludes assessment for an abscess. Electronically Signed   By: Marin Roberts M.D.   On: 08/18/2022 16:37   US SOFT TISSUE HEAD & NECK (NON-THYROID)  Result Date: 08/08/2022 INDICATION: LEFT parotid gland masses.  History of RIGHT parotidectomy EXAM: Procedures: 1. ULTRASOUND OF HEAD/NECK SOFT TISSUES 2. LEFT PAROTID GLAND MASSES ASPIRATION and CORE BIOPSY COMPARISON:  CT max face 05/30/2022. MEDICATIONS: The patient is currently admitted to the hospital and receiving intravenous antibiotics. The antibiotics were administered within an appropriate time frame prior to the initiation of the procedure. ANESTHESIA/SEDATION: Moderate (conscious) sedation was employed during this procedure. A total of Versed mg and Fentanyl mcg was administered intravenously. Moderate Sedation Time: minutes. The patient's level of consciousness and vital signs were monitored continuously by radiology nursing throughout the procedure under my direct supervision. CONTRAST:  None COMPLICATIONS: None immediate. TECHNIQUE: Ultrasound examination of the head and neck soft tissues was performed in the area of clinical concern. FINDINGS: Focused ultrasound at the  area of palpable concern, along the LEFT parotid gland demonstrates 2 ovoid anechoic lesions, largest measuring up to 2.7 cm in greatest length. Additional smaller cystic parotid lesion with mixed solid component. PROCEDURE: Informed written consent was obtained from the patient and/or patient's representative after a discussion of the risks, benefits and alternatives to treatment. Preprocedural ultrasound scanning was performed as described above. A timeout was performed prior to the initiation of the procedure. The LEFT face was prepped and draped in the usual sterile fashion. The overlying soft tissues were anesthetized with 1% lidocaine with epinephrine. Under direct ultrasound guidance, a 18 gauge biopsy introducer  needle was advanced into the smaller mixed cystic and solid LEFT parotid mass then core biopsies of the solid component were performed under direct ultrasound guidance. The introducer needle was then advanced into the largest cystic lesion and aspiration was performed, then followed by aspiration of the fluid component of the smaller lesion. Multiple ultrasound images were saved for procedural documentation purposes. Serosanguineous fluid was aspirated from the LEFT parotid lesions. A representative sample of aspirated fluid was capped and sent to the laboratory for analysis. The needle was removed and superficial hemostasis was achieved with manual compression. A dressing was placed. The patient tolerated the procedure well without immediate postprocedural complication. IMPRESSION: 1. Two LEFT parotid cystic lesions, largest measuring up to 2.7 cm. Smaller lesion with dependent solid component, of which was targeted for core biopsy 2. Successful US guided of core biopsy and aspiration of LEFT parotid lesions. Pathology of the core biopsy and cytology of the aspirated sample was sent to the laboratory as requested by the ordering clinical team. Michaelle Birks, MD [facility] Radiology Electronically Signed   By: Michaelle Birks M.D.   On: 08/08/2022 18:16   [place] CORE BIOPSY (SALIVARY GLAND/PAROTID GLAND)  Result Date: 08/08/2022 INDICATION: LEFT parotid gland masses.  History of RIGHT parotidectomy EXAM: Procedures: 1. ULTRASOUND OF HEAD/NECK SOFT TISSUES 2. LEFT PAROTID GLAND MASSES ASPIRATION and CORE BIOPSY COMPARISON:  CT max face 05/30/2022. MEDICATIONS: The patient is currently admitted to the hospital and receiving intravenous antibiotics. The antibiotics were administered within an appropriate time frame prior to the initiation of the procedure. ANESTHESIA/SEDATION: Moderate (conscious) sedation was employed during this procedure. A total of Versed mg and  Fentanyl mcg was administered intravenously. Moderate Sedation Time: minutes. The patient's level of consciousness and vital signs were monitored continuously by radiology nursing throughout the procedure under my direct supervision. CONTRAST:  None COMPLICATIONS: None immediate. TECHNIQUE: Ultrasound examination of the head and neck soft tissues was performed in the area of clinical concern. FINDINGS: Focused ultrasound at the area of palpable concern, along the LEFT parotid gland demonstrates 2 ovoid anechoic lesions, largest measuring up to 2.7 cm in greatest length. Additional smaller cystic parotid lesion with mixed solid component. PROCEDURE: Informed written consent was obtained from the patient and/or patient's representative after a discussion of the risks, benefits and alternatives to treatment. Preprocedural ultrasound scanning was performed as described above. A timeout was performed prior to the initiation of the procedure. The LEFT face was prepped and draped in the usual sterile fashion. The overlying soft tissues were anesthetized with 1% lidocaine with epinephrine. Under direct ultrasound guidance, a 18 gauge biopsy introducer needle was advanced into the smaller mixed cystic and solid LEFT parotid mass then core biopsies of the solid component were performed under direct ultrasound guidance. The introducer needle was then advanced into the largest cystic lesion and aspiration was  performed, then followed by aspiration of the fluid component of the smaller lesion. Multiple ultrasound images were saved for procedural documentation purposes. Serosanguineous fluid was aspirated from the LEFT parotid lesions. A representative sample of aspirated fluid was capped and sent to the laboratory for analysis. The needle was removed and superficial hemostasis was achieved with manual compression. A dressing was placed. The patient tolerated the procedure well without immediate postprocedural complication.  IMPRESSION: 1. Two LEFT parotid cystic lesions, largest measuring up to 2.7 cm. Smaller lesion with dependent solid component, of which was targeted for core biopsy 2. Successful US guided of core biopsy and aspiration of LEFT parotid lesions. Pathology of the core biopsy and cytology of the aspirated sample was sent to the laboratory as requested by the ordering clinical team. Michaelle Birks, MD [facility] Electronically Signed   By: Michaelle Birks M.D.   On: 08/08/2022 18:16     Discharge Exam: Vitals:   08/21/22 2051 08/22/22 0550  BP: (!) 143/61 (!) 143/59  Pulse: 64 70  Resp: 18 18  Temp: 98.2 F (36.8 C) (!) 97.5 F (36.4 C)  SpO2: 100% 95%   Vitals:   08/21/22 1014 08/21/22 1718 08/21/22 2051 08/22/22 0550  BP: 137/63 111/69 (!) 143/61 (!) 143/59  Pulse: 60 62 64 70  Resp:   18 18  Temp: 98 F (36.7 C) 97.7 F (36.5 C) 98.2 F (36.8 C) (!) 97.5 F (36.4 C)  TempSrc: Oral Oral Oral Oral  SpO2: 99% 100% 100% 95%  Weight:      Height:        General: Pt is alert, awake, not in acute distress Cardiovascular: RRR, S1/S2 +, no rubs, no gallops Respiratory: CTA bilaterally, no wheezing, no rhonchi Abdominal: Soft, NT, ND, bowel sounds + Extremities: no edema, no cyanosis    The results of significant diagnostics from this hospitalization (including imaging, microbiology, ancillary and laboratory) are listed below for reference.     Microbiology: Recent Results (from the past 240 hour(s))  Aerobic/Anaerobic Culture w Gram Stain (surgical/deep wound)     Status: None (Preliminary result)   Collection Time: 08/19/22 11:33 AM   Specimen: Wound; Abscess  Result Value Ref Range Status   Specimen Description WOUND  Final   Special Requests NONE  Final   Gram Stain   Final    RARE GRAM POSITIVE COCCI IN CLUSTERS ABUNDANT WBC PRESENT, PREDOMINANTLY PMN Performed at [facility], [address], [place],  [place] [address]    Culture   Final    FEW STAPHYLOCOCCUS AUREUS NO ANAEROBES ISOLATED; CULTURE IN PROGRESS FOR 5 DAYS    Report Status PENDING  Incomplete   Organism ID, Bacteria STAPHYLOCOCCUS AUREUS  Final      Susceptibility   Staphylococcus aureus - MIC*    CIPROFLOXACIN <=0.5 SENSITIVE Sensitive     ERYTHROMYCIN <=0.25 SENSITIVE Sensitive     GENTAMICIN <=0.5 SENSITIVE Sensitive     OXACILLIN <=0.25 SENSITIVE Sensitive     TETRACYCLINE <=1 SENSITIVE Sensitive     VANCOMYCIN 1 SENSITIVE Sensitive     TRIMETH/SULFA <=10 SENSITIVE Sensitive     CLINDAMYCIN <=0.25 SENSITIVE Sensitive     RIFAMPIN <=0.5 SENSITIVE Sensitive     Inducible Clindamycin NEGATIVE Sensitive     * FEW STAPHYLOCOCCUS AUREUS     Labs: BNP (last 3 results) No results for input(s): "BNP" in the last 8760 hours. Basic Metabolic Panel: Recent Labs  Lab 08/18/22 1307 08/18/22  1415 08/18/22 2104 08/19/22 0514 08/20/22 0049 08/21/22 0943  NA 140 141  --  135 137 140  K 5.1 5.2*  --  4.5 4.6 4.5  CL 112* 112*  --  105 108 109  CO2 20*  --   --  18* 17* 23  GLUCOSE 127* 123*  --  154* 105* 103*  BUN 38* 41*  --  36* 46* 35*  CREATININE 2.22* 2.40*  --  2.04* 1.87* 1.74*  CALCIUM 10.0  --   --  9.7 9.6 9.4  MG  --   --  1.3* 1.9  --   --   PHOS  --   --  2.8 4.1  --   --    Liver Function Tests: Recent Labs  Lab 08/18/22 1307 08/18/22 2104 08/19/22 0514  AST 41 42* 38  ALT 53* 44 45*  ALKPHOS 64 52 57  BILITOT 0.5 1.7* 0.5  PROT 7.1 6.1* 6.9  ALBUMIN 2.7* 2.5* 2.7*   No results for input(s): "LIPASE", "AMYLASE" in the last 168 hours. No results for input(s): "AMMONIA" in the last 168 hours. CBC: Recent Labs  Lab 08/18/22 1307 08/18/22 1415 08/19/22 0514 08/20/22 0049 08/21/22 0943  WBC 16.0*  --  13.4* 18.3* 10.5  NEUTROABS 13.6*  --   --  15.3* 7.3  HGB 12.7* 12.9* 12.4* 11.2* 11.1*  HCT 38.4* 38.0* 38.2* 32.5* 33.9*  MCV 101.3*  --  100.8* 97.6 99.7  PLT 263  --  280 241 264    Cardiac Enzymes: Recent Labs  Lab 08/18/22 2104  CKTOTAL 100   BNP: Invalid input(s): "POCBNP" CBG: No results for input(s): "GLUCAP" in the last 168 hours. D-Dimer No results for input(s): "DDIMER" in the last 72 hours. Hgb A1c No results for input(s): "HGBA1C" in the last 72 hours. Lipid Profile No results for input(s): "CHOL", "HDL", "LDLCALC", "TRIG", "CHOLHDL", "LDLDIRECT" in the last 72 hours. Thyroid function studies No results for input(s): "TSH", "T4TOTAL", "T3FREE", "THYROIDAB" in the last 72 hours.  Invalid input(s): "FREET3" Anemia work up No results for input(s): "VITAMINB12", "FOLATE", "FERRITIN", "TIBC", "IRON", "RETICCTPCT" in the last 72 hours. Urinalysis    Component Value Date/Time   COLORURINE YELLOW 08/18/2022 [place] 08/18/2022 2314   LABSPEC 1.017 08/18/2022 2314   PHURINE 5.0 08/18/2022 2314   GLUCOSEU NEGATIVE 08/18/2022 2314   HGBUR NEGATIVE 08/18/2022 2314   BILIRUBINUR NEGATIVE 08/18/2022 2314   KETONESUR NEGATIVE 08/18/2022 2314   PROTEINUR NEGATIVE 08/18/2022 2314   NITRITE NEGATIVE 08/18/2022 2314   LEUKOCYTESUR NEGATIVE 08/18/2022 2314   Sepsis Labs Recent Labs  Lab 08/18/22 1307 08/19/22 0514 08/20/22 0049 08/21/22 0943  WBC 16.0* 13.4* 18.3* 10.5   Microbiology Recent Results (from the past 240 hour(s))  Aerobic/Anaerobic Culture w Gram Stain (surgical/deep wound)     Status: None (Preliminary result)   Collection Time: 08/19/22 11:33 AM   Specimen: Wound; Abscess  Result Value Ref Range Status   Specimen Description WOUND  Final   Special Requests NONE  Final   Gram Stain   Final    RARE GRAM POSITIVE COCCI IN CLUSTERS ABUNDANT WBC PRESENT, PREDOMINANTLY PMN Performed at [facility], [address] [place], [place] [address]    Culture   Final    FEW STAPHYLOCOCCUS AUREUS NO ANAEROBES ISOLATED; CULTURE IN PROGRESS FOR 5 DAYS    Report Status PENDING  Incomplete   Organism ID, Bacteria  STAPHYLOCOCCUS AUREUS  Final  Susceptibility   Staphylococcus aureus - MIC*    CIPROFLOXACIN <=0.5 SENSITIVE Sensitive     ERYTHROMYCIN <=0.25 SENSITIVE Sensitive     GENTAMICIN <=0.5 SENSITIVE Sensitive     OXACILLIN <=0.25 SENSITIVE Sensitive     TETRACYCLINE <=1 SENSITIVE Sensitive     VANCOMYCIN 1 SENSITIVE Sensitive     TRIMETH/SULFA <=10 SENSITIVE Sensitive     CLINDAMYCIN <=0.25 SENSITIVE Sensitive     RIFAMPIN <=0.5 SENSITIVE Sensitive     Inducible Clindamycin NEGATIVE Sensitive     * FEW STAPHYLOCOCCUS AUREUS     Time coordinating discharge: Over 30 minutes  SIGNED:   Darliss Cheney, MD  [facility] 08/22/2022, 8:57 AM *Please note that this is a verbal dictation therefore any spelling or grammatical errors are due to the "[place] [employer]" system interpretation. If 7PM-7AM, please contact night-coverage [email or web address]

## 2022-08-22 NOTE — TOC Transition Note (Signed)
Transition of Care [facility]) - CM/SW Discharge Note   Patient Details  Name: Maveryk Renstrom MRN: [id number] Date of Birth: [birth year]  [facility]) CM/SW Contact:  Tom-Johnson, Renea Ee, RN Phone Number: 08/22/2022, 10:06 AM   Clinical Narrative:     Patient is scheduled for discharge today. D/c home on oral abx X 10 days. Hospital f/u info on AVS. No PT/OT f/u noted. Denies any other needs. Family to transport at discharge. No further TOC needs noted.     Final next level of care: Home/Self Care Barriers to Discharge: Barriers Resolved   Patient Goals and CMS Choice Patient states their goals for this hospitalization and ongoing recovery are:: To return home CMS Medicare.gov Compare Post Acute Care list provided to:: Patient Choice offered to / list presented to : NA  Discharge Placement                Patient to be transferred to facility by: Family      Discharge Plan and Services                DME Arranged: N/A DME Agency: NA       HH Arranged: NA HH Agency: NA        Social Determinants of Health (SDOH) Interventions Transportation Interventions: Inpatient TOC, Intervention Not Indicated, Patient Resources (Friends/Family)   Readmission Risk Interventions     No data to display

## 2022-08-22 NOTE — Progress Notes (Signed)
DISCHARGE NOTE HOME [name] to be discharged Home per MD order. Discussed prescriptions and follow up appointments with the patient. Prescriptions given to patient; medication list explained in detail. Patient verbalized understanding.  Skin clean, dry and intact without evidence of skin break down, no evidence of skin tears noted. IV catheter discontinued intact. Site without signs and symptoms of complications. Dressing and pressure applied. Pt denies pain at the site currently. No complaints noted.  Patient free of lines, drains, and wounds.   An After Visit Summary (AVS) was printed and given to the patient. Patient escorted via wheelchair, and discharged home via private auto.  Vira Agar, RN

## 2022-08-22 NOTE — Telephone Encounter (Signed)
Pt c/o medication issue:  1. Name of Medication: Losartan   2. How are you currently taking this medication (dosage and times per day)?   3. Are you having a reaction (difficulty breathing--STAT)?   4. What is your medication issue? Pt was told in the hospital to stop taking this medication and he would like a call back to discuss this.

## 2022-08-22 NOTE — Plan of Care (Signed)

## 2022-08-24 ENCOUNTER — Telehealth: Payer: Self-pay | Admitting: Cardiology

## 2022-08-24 DIAGNOSIS — A4901 Methicillin susceptible Staphylococcus aureus infection, unspecified site: Secondary | ICD-10-CM | POA: Diagnosis not present

## 2022-08-24 DIAGNOSIS — K113 Abscess of salivary gland: Secondary | ICD-10-CM | POA: Diagnosis not present

## 2022-08-24 DIAGNOSIS — D119 Benign neoplasm of major salivary gland, unspecified: Secondary | ICD-10-CM | POA: Diagnosis not present

## 2022-08-24 LAB — AEROBIC/ANAEROBIC CULTURE W GRAM STAIN (SURGICAL/DEEP WOUND)

## 2022-08-24 NOTE — Telephone Encounter (Signed)
Will forward to Dr Radford Pax for review .Adonis Housekeeper

## 2022-08-24 NOTE — Telephone Encounter (Signed)
Pt c/o medication issue:  1. Name of Medication: Losartan  2. How are you currently taking this medication (dosage and times per day)?  Not taking  3. Are you having a reaction (difficulty breathing--STAT)?   4. What is your medication issue?   Patient stated he was taken off this medication.  Patient reported BP reading as requested.    147/69  HR 55  12/12 160/72  HR 54  12/12 149/72  HR 60  12/13 am

## 2022-08-29 DIAGNOSIS — Z09 Encounter for follow-up examination after completed treatment for conditions other than malignant neoplasm: Secondary | ICD-10-CM | POA: Diagnosis not present

## 2022-08-29 DIAGNOSIS — I10 Essential (primary) hypertension: Secondary | ICD-10-CM | POA: Diagnosis not present

## 2022-08-29 DIAGNOSIS — R200 Anesthesia of skin: Secondary | ICD-10-CM | POA: Diagnosis not present

## 2022-08-29 DIAGNOSIS — N1831 Chronic kidney disease, stage 3a: Secondary | ICD-10-CM | POA: Diagnosis not present

## 2022-08-29 DIAGNOSIS — K113 Abscess of salivary gland: Secondary | ICD-10-CM | POA: Diagnosis not present

## 2022-09-02 NOTE — Telephone Encounter (Signed)
Spoke with pt and on his own increased Amlodipine to 10 mg 3 days ago this am B/P was 124/50 and the last couple of days 130's /50-60's Per pt losartan was stopped due to kidney's pt's understanding the patient will continue to monitor B/P and send in readings via mychart ./cy

## 2022-09-19 ENCOUNTER — Other Ambulatory Visit: Payer: Self-pay | Admitting: *Deleted

## 2022-09-19 DIAGNOSIS — J84112 Idiopathic pulmonary fibrosis: Secondary | ICD-10-CM

## 2022-09-19 NOTE — Patient Instructions (Signed)
ICD-10-CM   1. IPF (idiopathic pulmonary fibrosis) (HCC)  J84.112     2. Obesity (BMI 30.0-34.9)  E66.9     3. Anemia, unspecified type  D64.9     4. Stage 3a chronic kidney disease (HCC)  N18.31       IPF (idiopathic pulmonary fibrosis) (HCC)  - stable versus slowly progressive  Plan  - continue supportive care per shared decision making - take consent and consider enrolling in [employer] DEXA Bone scan study  - meet Leigh Ann of [facility]   Obesity (BMI 30.0-34.9)  -weight 280# at our office might be inaccurate; ? Our macine is out of calibration  Plan  - talk to PCP about weight loss drugs  Anemia, unspecified type Stage 3a chronic kidney disease (HCC)  Plan  - per PCP Farris Has, MD   Followup - Do spirometry and DLCO in 6 months -Return to see Dr. Marchelle Gearing in 6 months  -Symptom score and walk test at follow-up

## 2022-09-19 NOTE — Progress Notes (Signed)
IOV 06/09/2020  Subjective:  Patient ID: [name], male , DOB: March 15, 1941 , age 82 y.o. , MRN: [id number] , ADDRESS: [address] [place] [address]  PCP Hulan Fess, MD Cardiologist Dr. Golden Hurter   06/09/2020 -   Chief Complaint  Patient presents with   Consult    sob for the last year, worse for the last 6 month.     HPI Abdul Beirne 82 y.o. -82 year old male with a history of atherosclerotic coronary artery disease [status post remote PCI of LAD], hypertension and hyperlipidemia.  History of transaminitis that resolved after stopping ethanol and Tylenol.  Lower extremity Dopplers in 2017 30-49% left common femoral arterial stenosis.  He saw Dr. Radford Pax cardiology in May 2021 with new onset of shortness of breath that is progressive.  Associated with exertional chest pressure.  At baseline he goes to [place] a lot and walks 3 miles [place] the beach but as of May 2021 he was only able to walk 5 feet and had to stop halfway through that walk.  Also notices walking up stairs relieved by rest.  Overall dyspnea on exertion for 1 year.  Worse in the last 6 months but he also says that he some better in the last few months.  Denies any proximal nocturnal dyspnea orthopnea or lower extremity edema dizziness or palpitations or syncope.  Because of this he underwent nuclear medicine cardiac stress test February 14, 2020 that was low risk with ejection fraction 65%.  But he has had persistent symptoms and therefore has been referred here.  He then had pulmonary function test April 02, 2020 that shows mild restriction with FVC 3.82 L / 75% total lung capacity 5.76/71% but normal DLCO.  But because of persistent symptoms has been referred here.  Of note his echo showed grade 1 diastolic dysfunction.  Of note he also has a chronic cough during the same time.  -He tells me that recently his [facility] duct died out and after that the cough improved when the [facility] duct was off.  He is  now going to get his [facility] duct evaluated.    He also reports 10 pound weight gain during this time.  Normally is around 290 pounds.  Now he is at 300 pounds.  He is also been more sedentary because of the pandemic.  He says during this time his wife has noticed that he occasionally snores.  He does fall easily fatigued during the daytime.  He also has disrupted sleep pattern.  I did a stop bang questionnaire and it is 6 out of 8 suggesting significantly elevated risk for sleep apnea.  He has never had a sleep study.`  He is going back to [place], [place] where he owns another home.  He will not be able to do test in the next few weeks.   Results for [name], [name] (MRN [id number]) as of 06/09/2020 14:16  Ref. Range 03/23/2017 09:13  Please Note (HCV): Unknown Comment  Anti Nuclear Antibody (ANA) Latest Ref Range: Negative  Negative  ANCA Proteinase 3 Latest Ref Range: 0.0 - 3.5 U/mL <3.5  Atypical pANCA Latest Ref Range: Neg:<1:20 titer <1:20  ENA SSA (RO) Ab Latest Ref Range: 0.0 - 0.9 AI <0.2  ENA SSB (LA) Ab Latest Ref Range: 0.0 - 0.9 AI <0.2  Deamidated Gliadin Abs, IgG Latest Ref Range: 0 - 19 units 2  Myeloperoxidase Ab Latest Ref Range: 0.0 - 9.0 U/mL <9.0  RA Latex Turbid. Latest Ref Range: 0.0 - 13.9 IU/mL <10.0  Transglutaminase IgA Latest Ref Range: 0 - 3 U/mL <2  Cytoplasmic (C-ANCA) Latest Ref Range: Neg:<1:20 titer <1:20  P-ANCA Latest Ref Range: Neg:<1:20 titer <1:20  Antigliadin Abs, IgA Latest Ref Range: 0 - 19 units 6  IgG (Immunoglobin G), Serum Latest Ref Range: 700 - 1,600 mg/dL 825  IgM (Immunoglobulin M), Srm Latest Ref Range: 15 - 143 mg/dL 126  IgA/Immunoglobulin A, Serum Latest Ref Range: 61 - 437 mg/dL 312  Lyme IgG/IgM Ab Latest Ref Range: 0.00 - 0.90 ISR <0.91    NP visit Nov 2021   07/23/2020  - Visit   82 year old male former smoker followed in our office for dyspnea on exertion and chronic cough.  He is established with Dr. Chase Caller.  He was seen for an  initial consult in September/2021.  Plan of care from that consult with Dr. Chase Caller was as follows: Obtain high-resolution CT chest, do simple walk test, CBC with differential, IgE.  Stop bang score 6.  Was referred to Dr. Radford Pax for sleep study.  Patient presenting today as follow-up.  Patient reporting that he has been scheduled for home sleep study with cardiology on 08/11/2020.  Insurance would not pay for an in lab study.  Patient denies any sort of family history of lung disease.  He does report potential exposures when he was in the service for 2 years 1 of those years being in [place].  He reports a previous history of a primary care provider reporting that when he returned he may have had malaria and he was on malaria medications.  He is unsure of the validity of this.  Patient also worked in copper rod production for around 3 years.  He was around acid tanks for around 1 to 2 years during the rod washing.  He did not wear a mask.  He reports there is lots of smoke, copper dust as well as he to wear special close due to the exposures of the acid.  Patient is also currently dealing with an HVAC issue.  There is concerned there may be mold in this.  Both him and his wife have had respiratory symptoms of increased cough when the machine was running.  Is currently turned off and is being prepared to be cleaned.  He reports that the initial inspection by the HVAC team felt that there was no mold present.  Patient denies any persistent issues of acid reflux.  He does have a history of GERD and has a history of hiatal hernia.  He was treated with Nexium for around 6 to 8 weeks.  He reports he has been off this medication for many years.  He does not have breakthrough reflux symptoms or require Tums.  Patient continues to have aspects of shortness of breath occasionally.  Especially with physical exertion.  Patient also has an occasional cough.  Cough is more present in the morning when he is waking up.   Sometimes cough is productive.  He does have significant clear nasal drainage.  He is occasionally using a nasal medication that he believes is Flonase that his wife purchased over-the-counter.   OV 08/25/2020   Subjective:  Patient ID: [name], male , DOB: May 16, 1941, age 19 y.o. years. , MRN: [id number],  ADDRESS: [address] [place] [address] PCP  Hulan Fess, MD Providers : Treatment Team:  Attending Provider: Brand Males, MD Patient Care Team: Hulan Fess, MD as PCP -  General (Family Medicine) Sueanne Margarita, MD as PCP - Cardiology (Cardiology)    Chief Complaint  Patient presents with   Follow-up    ILD, doing ok, occ cough with some mucous       HPI [name] 82 y.o. -returns for follow-up to discuss his test results.  He is here with his wife who I am meeting for the first time.  His high-resolution CT chest is interpreted as indeterminate for UIP.  I personally visualized this I personally thought it was probable UIP.  I took a second opinion from Dr. Carilyn Goodpasture thoracic radiologist who feels that patient could get classified as probable UIP on the CT scan.  Long discussion was held with the patient but he did not bring his ILD questionnaire.  He had to bring this after the visit.  Therefore parts of this dictation happened after his visit but on the same day.  His main concern is that he will die prematurely.  [place] Integrated Comprehensive ILD Questionnaire  Symptoms: He tells me that he had insidious onset of shortness of breath for the last 12 months.  Since it started it is the same.  He has level 1 dyspnea for household work, shopping and walking at a level pace.  Level 2 dyspnea on walking up stairs.  He does have difficulty keeping up with others of his age.  He has neuropathy and hip soreness.  He also has a cough since March 2021.  It is the same since it started moderate in severity.  It is dry.  He does clear his throat and he  does feel a tickle in his throat.  There is no hemoptysis or sputum production.  No wheezing no nausea no vomiting no diarrhea.  The cough is mostly in the morning.  His symptoms do feel better when he stops activity for 5 minutes.  It makes him feel more short of breath when he does vigorous activity.  No associated symptoms.   Past Medical History : He has a history of coronary artery disease with 1 stent. He is undergoing a sleep apnea work-up.  He has chronic kidney disease stage III according to his history. Malaria in [place]. Polio as a kid   He does not have rheumatoid arthritis.  No scleroderma no lupus no polymyositis no Sjogren's.  No other vasculitis.  No thyroid disease no diabetes.  No stroke no seizures.  No hepatitis no pneumonia.  His creatinine in May 2021 was 1.58 mg percent with a GFR of 42-48.  In October 2019 his creatinine was 1.35 mg percent.   Of note he says his dental hygienist has noted persistent redness in his hard palate.  He is asking me to refer him to ENT specialist and I have obliged  ROS: He does have arthralgia related to gout.  He has seborrhea.  He denies any oral ulcers or heartburn or vomiting or nausea.  Denies any weight loss.  He has obesity.  No recurrent fever no dryness.  No fatigue   FAMILY HISTORY of LUNG DISEASE: No pulmonary fibrosis no COPD.  His sister has autoimmune disease Brother has asthma   EXPOSURE HISTORY: He smokes cigarettes between 1960 and 1966 2 packs.  He also smoked cigars for 20 years.  No passive smoking.  No electronic cigarettes no marijuana no vaping no cocaine no intravenous drug use. Was      HOME and HOBBY DETAILS : Single-family home [place] for the last  10 years.  Age of the home is 25 years.  It is a 5 bedroom house.  There is no dampness.  No mold or mildew no humidifier use no CPAP use no nebulizer use.  No steam iron use no Jacuzzi use no misting Fountain.  No pet birds or parakeets no pet  gerbils or hamsters.  No feather pillows no mold in the [facility] duct.  No music habits.  No gardening habits.  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  During this time he was exposed to gas fumes and chemicals he reports that sulfuric acid copper out washing.  Otherwise negative for organic and inorganic antigens.   PULMONARY TOXICITY HISTORY (27 items): Negative     Radiology below were read by Dr. Lorin Picket is indeterminate for UIP.  My personal opinion this is probable UIP.  Dr. Carilyn Goodpasture radiologist can also concur with me it is probable UIP   Narrative & Impression  CLINICAL DATA:  Shortness of breath. Productive cough and shortness of breath exertion for approximately 1 year.   EXAM: CT CHEST WITHOUT CONTRAST   TECHNIQUE: Multidetector CT imaging of the chest was performed following the standard protocol without intravenous contrast. High resolution imaging of the lungs, as well as inspiratory and expiratory imaging, was performed.   COMPARISON:  None.   FINDINGS: Cardiovascular: Atherosclerotic calcification of the aorta, aortic valve and coronary arteries. Pulmonic trunk is enlarged. Heart is at the upper limits of normal in size to mildly enlarged. No pericardial effusion.   Mediastinum/Nodes: No pathologically enlarged mediastinal or axillary lymph nodes. Hilar regions are difficult to evaluate without IV contrast. Esophagus is unremarkable.   Lungs/Pleura: Mild basilar predominant subpleural reticulation, ground-glass and traction bronchiectasis/bronchiolectasis. Findings persist on prone imaging. No definitive honeycombing. No air trapping. 3 mm left upper lobe nodule (3/57). 4 mm peripheral left lower lobe nodule (3/106). Mild centrilobular emphysema. No pleural fluid. Airway is unremarkable.   Upper Abdomen: Visualized portions of the liver,  gallbladder, adrenal glands, kidneys, spleen, pancreas, stomach and bowel are grossly unremarkable. No upper abdominal adenopathy.   Musculoskeletal: Degenerative changes in the spine.   IMPRESSION: 1. Mild basilar predominant subpleural fibrosis may be due to nonspecific interstitial pneumonitis or usual interstitial pneumonitis. Findings are indeterminate for UIP per consensus guidelines: Diagnosis of Idiopathic Pulmonary Fibrosis: An Official ATS/ERS/JRS/ALAT Clinical Practice Guideline. [place], Iss 5, [id number], May 13 2017. 2. Pulmonary nodules measure up to 4 mm. No follow-up needed if patient is low-risk (and has no known or suspected primary neoplasm). Non-contrast chest CT can be considered in 12 months if patient is high-risk. This recommendation follows the consensus statement: Guidelines for Management of Incidental Pulmonary Nodules Detected on CT Images: From the Fleischner Society 2017; Radiology 2017; 284:228-243. 3. Aortic atherosclerosis (ICD10-I70.0). Coronary artery calcification. 4. Enlarged pulmonic trunk, indicative of pulmonary arterial hypertension. 5.  Emphysema (ICD10-J43.9).     Electronically Signed   By: Lorin Picket M.D.   On: 07/15/2020 14:33    Results for SHASHWAT, CLEARY (MRN [id number]) as of 08/25/2020 10:28  Ref. Range 07/23/2020 09:56 07/23/2020 09:58  Anti Nuclear Antibody (ANA) Latest Ref Range: NEGATIVE  POSITIVE (A)   ANA Pattern 1 Unknown Nuclear, Homogeneous (A)   ANA Titer 1 Latest Units: titer 4:00 (H)   Cyclic Citrullin Peptide Ab Latest Units: UNITS <  16   RA Latex Turbid. Latest Ref Range: <14 IU/mL <14   ANA,IFA RA DIAG PNL W/RFLX TIT/PATN Unknown Rpt (A)   Anti-Jo-1 Ab (RDL) Latest Ref Range: <20 Units  <20  Anti-PL-7 Ab (RDL) Latest Ref Range: Negative   Negative  Anti-PL-12 Ab (RDL) Latest Ref Range: Negative   Negative  Anti-EJ Ab (RDL) Latest Ref Range: Negative   Negative  Anti-OJ Ab (RDL)  Latest Ref Range: Negative   Negative  Anti-SRP Ab (RDL) Latest Ref Range: Negative   Negative  Anti-Mi-2 Ab (RDL) Latest Ref Range: Negative   Negative  Anti-TIF-1gamma Ab (RDL) Latest Ref Range: <20 Units  <20  Anti-MDA-5 Ab (CADM-140)(RDL) Latest Ref Range: <20 Units  <20  Anti-NXP-2 (P140) Ab (RDL) Latest Ref Range: <20 Units  <20  Anti-SAE1 Ab, IgG (RDL) Latest Ref Range: <20 Units  <20  Anti-PM/Scl-100 Ab (RDL) Latest Ref Range: <20 Units  <20  Anti-Ku Ab (RDL) Latest Ref Range: Negative   Negative  Anti-SS-A 52kD Ab, IgG (RDL) Latest Ref Range: <20 Units  <20  Anti-U1 RNP Ab (RDL) Latest Ref Range: <20 Units  <20  Anti-U2 RNP Ab (RDL) Latest Ref Range: Negative   Negative  Anti-U3 RNP (Fibrillarin)(RDL) Latest Ref Range: Negative   Negative  SSA (Ro) (ENA) Antibody, IgG Latest Ref Range: <1.0 NEG AI <1.0 NEG   SSB (La) (ENA) Antibody, IgG Latest Ref Range: <1.0 NEG AI <1.0 NEG   Scleroderma (Scl-70) (ENA) Antibody, IgG Latest Ref Range: <1.0 NEG AI <1.0 NEG     OV 10/28/2020  Subjective:  Patient ID: [name], male , DOB: December 29, 1940 , age 54 y.o. , MRN: [id number] , ADDRESS: [address] [place] [address] PCP Hulan Fess, MD Patient Care Team: Hulan Fess, MD as PCP - General (Family Medicine) Sueanne Margarita, MD as PCP - Cardiology (Cardiology)  This Provider for this visit: Treatment Team:  Attending Provider: Brand Males, MD    10/28/2020 -   Chief Complaint  Patient presents with   Follow-up    Get pft results.  Gets bloody nose when he blows his nose, small amount of blood, just since starting Esbriet.    IPF dx 08/25/2020: High PRob  clinical suspicion is you have IPF based on age greater than 31, male gender, Caucasian ethnicity, negative serology profile, history of acid reflux, previous exposure to smoke and copper and prior history of smoking and prob UIP on CT.  Start esbriet Januaryc 08, 2022 HPI [name] 82 y.o. -follow-up  idiopathic pulmonary fibrosis new diagnosis December 2021. We started him on pirfenidone January 2022. He says he is tolerating it really well. His symptom score is stable. In fact he feels that pirfenidone is helping him. He feels his lungs have opened up. He feels that the antifibrotic actions have kicked in. He does admit this could be psychological. He did complain of some bloody nose when he blows up. He wondered if it is because of pirfenidone. Explained to him pirfenidone does not have any antiplatelet agents. Explained that likely this is due to the cold winter and dry air. He shuttles between [place] and [place]. Did explain to him that if he were to relocate to [place] I would set him up with excellent ILD specialist at [facility]. We discussed patient support group. He seems interested. We discussed pulmonary rehabilitation but he has back and spine issues and knee issues that make it difficult. He wants to reflect on this. We discussed clinical trials  as a care option including specifically all the details below. He wants to reflect on this but he feels that shuttling between [place] in [place] can be difficult to do clinical trials. However once is stable on the drug and sometimes past he might be able to do it.  There are no other new issues. Last visit I did indicate to him to consider stopping his omega-3 because of concern of omega-3 making acid reflux worse which is a risk factor for IPF. However he says it has helped his triglycerides. He wants to continue on this. His acid reflux is not super active. I have supported him in this decision.     PFT    OV 12/08/2020  Subjective:  Patient ID: [name], male , DOB: 1941-08-23 , age 27 y.o. , MRN: [id number] , ADDRESS: [address] [place] [address] PCP Hulan Fess, MD Patient Care Team: Hulan Fess, MD as PCP - General (Family Medicine) Sueanne Margarita, MD as PCP - Cardiology (Cardiology)  This  Provider for this visit: Treatment Team:  Attending Provider: Brand Males, MD   IPF dx 08/25/2020 (reconfirmed MDD 11/17/20) : High PRob  clinical suspicion is you have IPF based on age greater than 52, male gender, Caucasian ethnicity, negative serology profile, history of acid reflux, previous exposure to smoke and copper and prior history of smoking and prob UIP on CT.   Start esbriet Jory Sims 08, 2022   12/08/2020 -   Chief Complaint  Patient presents with   Follow-up    Pt states he is doing okay since last visit and states breathing is about the same.     HPI [name] 82 y.o. -+ presents for follow-up.  Last visit was 1 month ago.  This visit was supposed to be a televisit but he showed up at the front door.  Therefore he preferred to do a face-to-face visit.  We discussed in the clinical conference and confirmation is that he is IPF.  He says he is tolerating pirfenidone well.  He is going to start his third month of pirfenidone in the first week of April 2022.  He says occasionally bites his tongue at night.  This is associated with starting losartan 6 months ago but is not on ACE inhibitor.  He will discuss this with the cardiologist.  Also says that tongue biting started after the pirfenidone but it is not a known side effect of pirfenidone.  Overall he is feeling stable.  He is not interested in clinical trials of pulmonary rehabilitation at this point.  He shuttling between [place] in [place].  His symptom scores are listed below.    Telel visit 01/19/21  82 yo former smoker followed for IPF Medical history significant for coronary artery disease, hypertension and hyperlipidemia.  Today's telemedicine visit is for a 6-week follow-up.  Patient has presumed IPF with interstitial changes consistent with UIP on high-resolution CT chest.  Patient was started on Esbriet in January 2022.  Patient says he is tolerating well.  He has had no nausea vomiting or diarrhea.   Appetite is good no weight loss.  Patient says he still gets short of breath with activities but tries to remain active.  He has minimum cough. Patient aware that he needs to return for lab work today.  Patient denies any flare of cough or shortness of breath.  No change in his activity level.    OV 05/28/2021  Subjective:  Patient ID: [name], male , DOB:  1940-10-03 , age 23 y.o. , MRN: [id number] , ADDRESS: [address] [place] [address] PCP Hulan Fess, MD Patient Care Team: Hulan Fess, MD as PCP - General (Family Medicine) Sueanne Margarita, MD as PCP - Cardiology (Cardiology)  This Provider for this visit: Treatment Team:  Attending Provider: Brand Males, MD    05/28/2021 -   Chief Complaint  Patient presents with   Follow-up    Pt states he has been doing okay since last visit. States that he stopped taking Esbriet about a month ago. Pt states that his breathing is about the same.    IPF dx 08/25/2020 (reconfirmed MDD 11/17/20) : High PRob  clinical suspicion is you have IPF based on age greater than 2, male gender, Caucasian ethnicity, negative serology profile, history of acid reflux, previous exposure to smoke and copper and prior history of smoking and prob UIP on CT.   Start esbriet Januaryc 08, 2022 - stopped summer 2022 afte sun rash x 2 times   Starting ofev  HPI [name] 82 y.o. -       OV 08/18/2021  Subjective:  Patient ID: [name], male , DOB: 12/27/1940 , age 49 y.o. , MRN: [id number] , ADDRESS: [address] [place] [address] PCP Hulan Fess, MD Patient Care Team: Hulan Fess, MD as PCP - General (Family Medicine) Sueanne Margarita, MD as PCP - Cardiology (Cardiology)  This Provider for this visit: Treatment Team:  Attending Provider: Brand Males, MD    08/18/2021 -   Chief Complaint  Patient presents with   Follow-up    F/U after starting Ofev. States his BP has increased since  starting Ofev on November 1st. Has had occasional episodes of diarrhea.     IPF dx 08/25/2020 (reconfirmed MDD 11/17/20) : High PRob  clinical suspicion is you have IPF based on age greater than 74, male gender, Caucasian ethnicity, negative serology profile, history of acid reflux, previous exposure to smoke and copper and prior history of smoking and prob UIP on CT.   Start esbriet Januaryc 08, 2022 - stopped summer 2022 afte sun rash x 2 times   Starting ofev  HPI NIKE 82 y.o. -returns for follow-up.  In the interim because he failed pirfenidone we switched him to nintedanib.  He is tolerating it well except for some mild diarrhea but 2 weeks after starting this his blood pressure was high the cardiology visit.  I reviewed the notes.  He started nintedanib around late October 2022/early November 2022 and by mid November there was increase in blood pressure [the third patient I am seeing with this issue this year].  Cardiologist Dr. Radford Pax has added amlodipine 5 mg/day to the regimen with is not controlling the blood pressure.  I did advise him that he could contact and increase it to 10 mg/day with a side effect being edema.  He is going to look into this.  His respiratory status is stable.  He had liver function test monitoring and he has a new increase in ALT.  He is on allopurinol and Lipitor which can also increase his liver enzymes but he has been on this for a long time.  Only new intervention is nintedanib.  Very likely and nintedanib is increasing the liver enzymes but is only a mild enzyme increase in 1 enzyme which is ALT.  He is agreed for close monitoring.  I did not ask him about his alcohol intake.  He has not had  PFTs.  We are holding off on PFTs because of his recent eye surgery which has gone well but the recovery is slow but improving.   He had high-resolution CT scan of the chest recently in November 2022.  Compared to 1 year ago there is no progression.  There are no  other new findings.  And CT scan of the chest in 1 year.  Recent Labs  Lab 08/17/21 0916  AST 35  ALT 52*  ALKPHOS 60  BILITOT 0.5  PROT 6.5  ALBUMIN 4.3      CT Chest data 07/27/21  CLINICAL DATA:  82 year old male with history of interstitial lung disease. Follow-up study.   EXAM: CT CHEST WITHOUT CONTRAST   TECHNIQUE: Multidetector CT imaging of the chest was performed following the standard protocol without intravenous contrast. High resolution imaging of the lungs, as well as inspiratory and expiratory imaging, was performed.   COMPARISON:  Chest CT 07/15/2020.   FINDINGS: Cardiovascular: Heart size is normal. There is no significant pericardial fluid, thickening or pericardial calcification. There is aortic atherosclerosis, as well as atherosclerosis of the great vessels of the mediastinum and the coronary arteries, including calcified atherosclerotic plaque in the left main, left anterior descending, left circumflex and right coronary arteries.   Mediastinum/Nodes: No pathologically enlarged mediastinal or hilar lymph nodes. Please note that accurate exclusion of hilar adenopathy is limited on noncontrast CT scans. Esophagus is unremarkable in appearance. No axillary lymphadenopathy.   Lungs/Pleura: High-resolution images demonstrate areas of ground-glass attenuation, septal thickening, mild subpleural reticulation, mild cylindrical bronchiectasis, thickening of the peribronchovascular interstitium and regional architectural distortion and volume loss. These findings are most evident in the lung bases, particularly in the lower lobes. Bronchiectasis is now more apparent in these regions, but no other significant progression of disease when compared to the prior study. No honeycombing. Inspiratory and expiratory imaging is unremarkable.   Upper Abdomen: Aortic atherosclerosis.   Musculoskeletal: There are no aggressive appearing lytic or blastic lesions  noted in the visualized portions of the skeleton.   IMPRESSION: 1. The appearance of the lungs remains compatible with interstitial lung disease, with a spectrum of findings categorized as probable usual interstitial pneumonia (UIP) per current ATS guidelines. No significant progression of disease compared to the prior study. 2. Aortic atherosclerosis, in addition to left main and 3 vessel coronary artery disease. Please note that although the presence of coronary artery calcium documents the presence of coronary artery disease, the severity of this disease and any potential stenosis cannot be assessed on this non-gated CT examination. Assessment for potential risk factor modification, dietary therapy or pharmacologic therapy may be warranted, if clinically indicated.   Aortic Atherosclerosis (ICD10-I70.0).     Electronically Signed   By: Vinnie Langton M.D.   On: 07/28/2021 10:58    No results found.    09/10/2021 Patient was contacted today for virtual follow-up/ Clinically stable pulmonary fibrosis. He has been on OFEV for 2 months. He was last seen by MR on 08/18/21. He had some mild diarrhea arou. BP elevated with cardiology, Dr. Radford Pax added Amlodipine 67m daily. Patient reports that his blood pressure reading this morning was 140/62 compared to previous readings in 150-160/70s. He is still having diarrhea.  He will go 2-3 days without any GI symptoms. When diarrhea is severe he will take imodium which does help. On average he takes 6 imodium tablets a week. ALT on CMET was elevated on 08/17/21. He has been on Allopurional and lipitor for a long  time. He did a repeat lab test which showed LFTs which were back to normal. He need another hepatic functoin panel in 1 week. He consumes 2-3 glasses of wine a day and 2-3 pints of beer a week.       OV 10/14/2021  Subjective:  Patient ID: [name], male , DOB: 13-Sep-1940 , age 50 y.o. , MRN: [id number] , ADDRESS: [address] [place] [address] PCP Hulan Fess, MD Patient Care Team: Hulan Fess, MD as PCP - General (Family Medicine) Sueanne Margarita, MD as PCP - Cardiology (Cardiology)  This Provider for this visit: Treatment Team:  Attending Provider: Brand Males, MD    10/14/2021 -   IPF follow-up on nintedanib.  Previous intolerance to pirfenidone. Chief Complaint  Patient presents with   Follow-up    Pt states he is about the same since last visit.     HPI [name] 82 y.o. -returns for follow-up.  He has intermittent mild transaminitis while on nintedanib.  This is no attributable to alcohol and also Lipitor.  Most recent liver function test is normal.  However he is having significant diarrhea.  He uses 6-7 Imodium each week.  He wants to stop nintedanib and give it a holiday for a few weeks and then reassess.  He did admit to drinking 2 cups of coffee a day and also 1 soda a week.  Beyond this he denies any other increased sugar intake or carbohydrate intake.  He does have high triglycerides and is on fish oil.  I recommended he stop fish oil and avoid coffee to help with his diarrhea.  He says he will stop the fish oil at least temporarily although is worried about the impact on his triglycerides.  I did indicate to him the best treatment for hypertriglyceridemia is low carbohydrate food.  In any event he wants to give nintedanib holiday.  We agreed to do a 2-week holiday.  He from a respiratory standpoint is stable.  His walking desaturation test is stable.  We are holding off on pulmonary function test at the moment because of recent cataract surgery.  He does have significant cough and is on Mucinex.  He says he has had 4 shots of COVID-vaccine.  Is wondering whether he should get another booster.  I indicated to him the current active strain is from [place] the [place] strain.  There is no mRNA vaccine for this.  Therefore we took a shared decision making to hold off.  However if he got  COVID we will do antiviral.    .    OV 01/03/2022  Subjective:  Patient ID: [name], male , DOB: 1941/02/11 , age 22 y.o. , MRN: [id number] , ADDRESS: [address] [place] [address] PCP Hulan Fess, MD Patient Care Team: Hulan Fess, MD as PCP - General (Family Medicine) Sueanne Margarita, MD as PCP - Cardiology (Cardiology)  This Provider for this visit: Treatment Team:  Attending Provider: Brand Males, MD    01/03/2022 -   Chief Complaint  Patient presents with   Follow-up    PFT performed today.  Pt states he is about the same since last visit. Pt stopped taking OFEV about 1 month ago due to having side effect of diarrhea.    HPI [name] 82 y.o. -returns for follow-up.  He tells me that he could not tolerate nintedanib because of all the side effects.  He says he stopped taking nintedanib over a month  ago and his quality of life is much better.  At this point in time he just wants to do expectant follow-up.  We discussed clinical trials as a care option but he finds it too restrictive on his lifestyle.  We discussed protocol with inhaled treprostinil is a phase 3 study for IPF.  It is less intense than the other studies.  However he does not like the idea that he has to do a nebulizer 4 times daily and each time at least 9-12 times puffs.  However he took the consent form for this.  He is currently taking some kind of a gummy that increases metabolism and he says it makes him feel better.  He wants to lose weight.  We discussed Ozempic.  He says he will bring this up with his primary care physician.       OV 06/14/2022  Subjective:  Patient ID: [name], male , DOB: 03-27-1941 , age 38 y.o. , MRN: [id number] , ADDRESS: [address] [place] [address] PCP Hulan Fess, MD Patient Care Team: Hulan Fess, MD as PCP - General (Family Medicine) Sueanne Margarita, MD as PCP - Cardiology (Cardiology)  This Provider for this  visit: Treatment Team:  Attending Provider: Brand Males, MD  06/14/2022 -   Chief Complaint  Patient presents with   Follow-up    Spiro/DLCO done today. Breathing is unchanged since the last visit. He tends to have a cough in the morning- non prod.      HPI [name] 82 y.o. -IVF follow-up.  He is on supportive care.  He presents with his wife who I am meeting for the first time.  At this point in time he feels his dyspnea is stable.  He says he has been dealing with other health issues that include obesity.  He has been on a keto diet and lost 35 pounds.  He has not entertained some of the newer medications.  He also deals with significant back pain and is using a cane.  He is also had facial swelling and has had a CT scan and apparently there is a dental abscess.  He is also needing tarsorrhaphy along with suffering from glaucoma.  This eye surgery was postponed because his wife had COVID.  Is not exposed to his wife with COVID.  His wife had COVID while she was traveling to [place] but the eye doctors misunderstood this and postponed his eye surgery.  At this point in time he is not interested in clinical trial.  Previously intolerant to approved antifibrotic's.  In the future he could be interested in clinical trials.  He wanted a handicap placard which we did for him.  CT Chest data   OV 09/20/2022  Subjective:  Patient ID: [name], male , DOB: October 21, 1940 , age 16 y.o. , MRN: [id number] , ADDRESS: [address] [place] [address] PCP London Pepper, MD Patient Care Team: London Pepper, MD as PCP - General (Family Medicine) Sueanne Margarita, MD as PCP - Cardiology (Cardiology)  This Provider for this visit: Treatment Team:  Attending Provider: Brand Males, MD   IPF dx 08/25/2020 (reconfirmed MDD 11/17/20) : High PRob  clinical suspicion is you have IPF based on age greater than 51, male gender, Caucasian ethnicity, negative serology profile, history of  acid reflux, previous exposure to smoke and copper and prior history of smoking and prob UIP on CT.   -On October 2023 visit the wife recollected mold in the  house was in [place] in the 1990s.  There is also a mold recently and he got rid of it.   IPF follow-up - Previous intolerance to pirfenidone. - Quit Ofev March 2023 - declined participation in cough study for IPF March 2023 - On expectant approach since sprim/summer 2023   - last CT Nov 2022   09/20/2022 -   Chief Complaint  Patient presents with   Follow-up    Cough persistent.  Worse in mornings     HPI [name] 82 y.o. -returns for follow-up.  Presents with his wife.  History is given by him but also review of the records external and also by the wife who is being an independent historian.  After his last visit in December 2023 he was admitted with parotid abscess on the left side.  I reviewed the note.  He did have some acute kidney injury and this went back to baseline.  He has since seen primary care doctor.  Also some mild anemia.  In terms of his shortness of breath he feels he is stable.  Symptom scores are stable.  Walking desaturation test he stopped a little bit more prematurely because he was tired but there is no chest pain or worsening shortness of breath.  He had pulmonary function test that shows his FVC is declined on 8% but his DLCO is stable.  He still has only mild disease.  We again discussed approach to IPF.  He again prefers to have supportive care and expectant approach with serial monitoring.  He is not interested in rechallenge with antifibrotic's or trying an interventional clinical trial at this time  We discussed the risk of osteoporosis in older men and also potential that the eye PF is an independent risk factor for osteoporosis.  [employer] is offering patient's a DEXA bone scan as part of the recent study.  He is interested in this.  Wife wanted to review weight loss options.  In particular he  feels the weighing scale today is out of calibration.  He says he gave him the wrong weight of 280 pounds.  Recently weighed 10 pounds lighter at the primary care and his personal home.  He is no longer doing the keto diet.  He is trying to visit with a nutritionist.  We did discuss the new weight loss drugs.  Asked him to talk about this with the primary care doctor.    SYMPTOM SCALE -  06/09/2020  08/25/2020  10/28/2020  12/08/2020  08/18/2021  10/14/2021  01/03/2022  06/14/2022  09/20/2022   O2 use _0  a ra ra ra  Shortness of Breath 0 -> 5 scale with 5 being worst (score 6 If unable to do)    0      At rest 0 0 0 0 1.5 0 0 0 0  Simple tasks - showers, clothes change, eating, shaving 1 0 _1 Household (dishes, doing bed, laundry) _2 Shopping _3 1. _4 Walking level at own pace _5 1._6 Walking up Stairs _7 Total (30-36) Dyspnea Score _8 How bad is your cough? _9 How bad is your fatigue _10 2  _0 How bad is nausea 0 0 0 0 0 0 0 0 0  How bad is vomiting?  00 0 0 0 0 0 0 0 0  How bad is diarrhea? 0 0 0 0 2 3 0 0 0  How bad is anxiety? 0 0 0 0 0 0 0 0 0  How bad is depression 0 0 0 0 0 0 0 1 0       Simple office walk 185 feet x  3 laps goal with forehead probe 06/09/2020  08/25/2020  10/28/2020  05/28/2021  10/14/2021  09/20/2022   O2 used ra  ra ra ra ra  Number laps completed _1 Did only 2 of 3 and got tired  Comments about pace nl 100% and 64/min moderate  avg   Resting Pulse Ox/HR 100% and 61/min  100% and 63 100% and 66 100% and 66 99% and HR 60  Final Pulse Ox/HR 97% and 98/min 98% and 80/min 99% and 100 98% and 1112 99% and 107 98% and HR 88  Desaturated </= 88% no       Desaturated <= 3% points yes       Got Tachycardic >/= 90/min yes       Symptoms at end of test x  Mod desypnea  Mild dyspnea   Miscellaneous comments x  Dyspnea started lap 1.NEver  stopped       PFT     Latest Ref Rng & Units 09/20/2022    8:56 AM 06/14/2022    9:49 AM 01/03/2022    2:49 PM 10/28/2020    9:00 AM 04/02/2020    9:46 AM  PFT Results  FVC-Pre L 3.46  P 3.78  P 3.67  3.69  3.82   FVC-Predicted Pre % 74  P 81  P 78  74  75   FVC-Post L    3.68  3.85   FVC-Predicted Post %    73  76   Pre FEV1/FVC % % 81  P 81  P 81  80  80   Post FEV1/FCV % %    83  83   FEV1-Pre L 2.81  P 3.07  P 2.96  2.94  3.04   FEV1-Predicted Pre % 84  P 92  P 87  81  83   FEV1-Post L    3.06  3.20   DLCO uncorrected ml/min/mmHg 21.97  P 19.97  P 20.48  21.84  23.34   DLCO UNC% % 81  P 73  P 75  76  81   DLCO corrected ml/min/mmHg 24.82  P 19.97  P 20.48  21.84  23.34   DLCO COR %Predicted % 91  P 73  P 75  76  81   DLVA Predicted % 119  P 90  P 93  99  103   TLC L    5.72  5.76   TLC % Predicted %    70  71   RV % Predicted %    72  72     P Preliminary result   HRCT    IMPRESSION: 1. The appearance of the lungs remains compatible with interstitial lung disease, with a spectrum of findings categorized as probable usual interstitial pneumonia (UIP) per current ATS guidelines. No significant progression of disease compared to the prior study. 2. Aortic atherosclerosis, in addition to left main and 3 vessel coronary artery disease. Please note that  although the presence of coronary artery calcium documents the presence of coronary artery disease, the severity of this disease and any potential stenosis cannot be assessed on this non-gated CT examination. Assessment for potential risk factor modification, dietary therapy or pharmacologic therapy may be warranted, if clinically indicated.   Aortic Atherosclerosis (ICD10-I70.0).     Electronically Signed   By: Vinnie Langton M.D.   On: 07/28/2021 10:58    has a past medical history of Ascending aorta dilatation ([place]), Benign essential HTN (09/09/2013), Chronic kidney disease, Coronary artery disease (08/2013),  Dyslipidemia, Elevated fasting glucose, Fatty liver, Glaucoma, Gout, Hyperlipidemia, Hypertension, Joint pain, LAD (lymphadenopathy), Obesity, Pure hypercholesterolemia (09/09/2013), PVD (peripheral vascular disease) ([place]) (06/12/2018), and Vision abnormalities.   reports that he quit smoking about 2 years ago. His smoking use included cigars. He has never used smokeless tobacco.  Past Surgical History:  Procedure Laterality Date   ADENOIDECTOMY     CATARACT EXTRACTION     CORONARY ANGIOPLASTY WITH STENT PLACEMENT  2009   EYE SURGERY     TONSILLECTOMY     WISDOM TOOTH EXTRACTION      Allergies  Allergen Reactions   Pirfenidone Rash   Ofev [Nintedanib] Diarrhea    Immunization History  Administered Date(s) Administered   Fluad Quad(high Dose 65+) 05/13/2020   Influenza, High Dose Seasonal PF 06/06/2019, 08/13/2021   Influenza-Unspecified 06/08/2020   PFIZER(Purple Top)SARS-COV-2 Vaccination 09/28/2019, 10/19/2019, 06/08/2020, 02/05/2021   Pneumococcal Conjugate-13 10/23/2014   Pneumococcal Polysaccharide-23 08/27/2012   Tdap 08/27/2012   Zoster, Live 08/29/2012, 09/28/2019, 03/11/2020    Family History  Problem Relation Age of Onset   Breast cancer Mother    Heart attack Father    Rheumatic fever Father    CAD Sister    Multiple sclerosis Sister    CAD Brother    Neuropathy Neg Hx      Current Outpatient Medications:    allopurinol (ZYLOPRIM) 300 MG tablet, Take 300 mg by mouth daily., Disp: , Rfl:    amLODipine (NORVASC) 10 MG tablet, Take 10 mg by mouth daily., Disp: , Rfl:    aspirin 81 MG tablet, Take 81 mg by mouth daily., Disp: , Rfl:    atorvastatin (LIPITOR) 80 MG tablet, Take 1 tablet (80 mg total) by mouth daily., Disp: 90 tablet, Rfl: 3   Cyanocobalamin (VITAMIN B-12 PO), Take 1 capsule by mouth daily., Disp: , Rfl:    Dextromethorphan-guaiFENesin (MUCINEX DM PO), Take 1 tablet by mouth daily., Disp: , Rfl:    dorzolamide-timolol (COSOPT) 22.3-6.8 MG/ML  ophthalmic solution, Place 1 drop into the left eye 2 (two) times daily., Disp: , Rfl:    ferrous sulfate 325 (65 FE) MG EC tablet, Take 325 mg by mouth 3 (three) times daily with meals., Disp: , Rfl:    fluticasone (FLONASE) 50 MCG/ACT nasal spray, Place 2 sprays into both nostrils as needed., Disp: , Rfl:    latanoprost (XALATAN) 0.005 % ophthalmic solution, Place 1 drop into the left eye daily., Disp: , Rfl:    metoprolol succinate (TOPROL-XL) 100 MG 24 hr tablet, Take with or immediately following a meal., Disp: 90 tablet, Rfl: 3   Netarsudil-Latanoprost (ROCKLATAN) 0.02-0.005 % SOLN, As directed in left eye, Disp: , Rfl:    nitroGLYCERIN (NITROSTAT) 0.4 MG SL tablet, Place 0.4 mg under the tongue every 5 (five) minutes as needed for chest pain. , Disp: , Rfl:    Omega-3 Fatty Acids (OMEGA 3 PO), Take 2 capsules by mouth in the morning and  at bedtime. 2000 mg BID, Disp: , Rfl:    pimecrolimus (ELIDEL) 1 % cream, Apply 1 Application topically 2 (two) times daily. To affected area on the face, Disp: , Rfl:    tamsulosin (FLOMAX) 0.4 MG CAPS capsule, Take 0.4 mg by mouth daily., Disp: , Rfl:  No current facility-administered medications for this visit.  Facility-Administered Medications Ordered in Other Visits:    technetium tetrofosmin (TC-MYOVIEW) injection 81.1 millicurie, 03.1 millicurie, Intravenous, Once PRN, Turner, Traci R, MD      Objective:   Vitals:   09/20/22 1001  BP: 118/60  Pulse: 64  Temp: 98.3 F (36.8 C)  TempSrc: Oral  SpO2: 99%  Weight: 280 lb (127 kg)  Height: _0  (1.905 m)    Estimated body mass index is 35 kg/m as calculated from the following:   Height as of this encounter: _1  (1.905 m).   Weight as of this encounter: 280 lb (127 kg).  _2 @  Filed Weights   09/20/22 1001  Weight: 280 lb (127 kg)     Physical Exam    General: No distress. Looks wel Neuro: Alert and Oriented x 3. GCS 15. Speech normal Psych: Pleasant Resp:   Barrel Chest - no.  Wheeze - no, Crackles - yes, No overt respiratory distress CVS: Normal heart sounds. Murmurs - no Ext: Stigmata of Connective Tissue Disease - no HEENT: Normal upper airway. PEERL +. No post nasal drip        Assessment:       ICD-10-CM   1. IPF (idiopathic pulmonary fibrosis) (HCC)  R94.585 Pulmonary function test    2. Obesity (BMI 30.0-34.9)  E66.9     3. Anemia, unspecified type  D64.9     4. Stage 3a chronic kidney disease ([place])  N18.31          Plan:     Patient Instructions     ICD-10-CM   1. IPF (idiopathic pulmonary fibrosis) ([place])  J84.112     2. Obesity (BMI 30.0-34.9)  E66.9     3. Anemia, unspecified type  D64.9     4. Stage 3a chronic kidney disease (HCC)  N18.31       IPF (idiopathic pulmonary fibrosis) (HCC)  - stable versus slowly progressive  Plan  - continue supportive care per shared decision making - take consent and consider enrolling in [place] Bone scan study  - meet Leigh Ann of [facility]   Obesity (BMI 30.0-34.9)  -weight 280# at our office might be inaccurate; ? Our macine is out of calibration  Plan  - talk to PCP about weight loss drugs  Anemia, unspecified type Stage 3a chronic kidney disease ([place])  Plan  - per PCP London Pepper, MD   Followup - Do spirometry and DLCO in 6 months -Return to see Dr. Chase Caller in 6 months  -Symptom score and walk test at follow-up  ( Level 05 visit: Estb 40-54 min visit type: on-site physical face to visit  in total care time and counseling or/and coordination of care by this undersigned MD - Dr Brand Males. This includes one or more of the following on this same day 09/20/2022: pre-charting, chart review, note writing, documentation discussion of test results, diagnostic or treatment recommendations, prognosis, risks and benefits of management options, instructions, education, compliance or risk-factor reduction. It excludes time spent by the [place] or office  staff in the care of the patient. Actual time 29 min)   SIGNATURE    Dr.  Brand Males, M.D., F.C.C.P,  Pulmonary and Critical Care Medicine Staff Physician, [place] Director - Interstitial Lung Disease  Program  Pulmonary [facility] [place] [facility], [place], [address]  Pager: [phone], If no answer or between  15:00h - 7:00h: call [phone] Telephone: [phone]  11:01 AM 09/20/2022

## 2022-09-20 ENCOUNTER — Other Ambulatory Visit: Payer: Self-pay | Admitting: Internal Medicine

## 2022-09-20 ENCOUNTER — Encounter: Payer: Self-pay | Admitting: Internal Medicine

## 2022-09-20 ENCOUNTER — Ambulatory Visit: Payer: Medicare HMO | Admitting: Internal Medicine

## 2022-09-20 ENCOUNTER — Encounter: Payer: Medicare HMO | Admitting: Internal Medicine

## 2022-09-20 VITALS — BP 118/60 | HR 64 | Temp 98.3°F | Ht 75.0 in | Wt 280.0 lb

## 2022-09-20 DIAGNOSIS — J84112 Idiopathic pulmonary fibrosis: Secondary | ICD-10-CM | POA: Diagnosis not present

## 2022-09-20 DIAGNOSIS — N1831 Chronic kidney disease, stage 3a: Secondary | ICD-10-CM

## 2022-09-20 DIAGNOSIS — D649 Anemia, unspecified: Secondary | ICD-10-CM

## 2022-09-20 DIAGNOSIS — E669 Obesity, unspecified: Secondary | ICD-10-CM | POA: Diagnosis not present

## 2022-09-20 DIAGNOSIS — Z006 Encounter for examination for normal comparison and control in clinical research program: Secondary | ICD-10-CM

## 2022-09-20 DIAGNOSIS — E66811 Obesity, class 1: Secondary | ICD-10-CM

## 2022-09-20 LAB — PULMONARY FUNCTION TEST
DL/VA % pred: 119 %
DL/VA: 4.58 ml/min/mmHg/L
DLCO cor % pred: 91 %
DLCO cor: 24.82 ml/min/mmHg
DLCO unc % pred: 81 %
DLCO unc: 21.97 ml/min/mmHg
FEF 25-75 Pre: 3.02 L/sec
FEF2575-%Pred-Pre: 130 %
FEV1-%Pred-Pre: 84 %
FEV1-Pre: 2.81 L
FEV1FVC-%Pred-Pre: 113 %
FEV6-%Pred-Pre: 79 %
FEV6-Pre: 3.46 L
FEV6FVC-%Pred-Pre: 106 %
FVC-%Pred-Pre: 74 %
FVC-Pre: 3.46 L
Pre FEV1/FVC ratio: 81 %
Pre FEV6/FVC Ratio: 100 %

## 2022-09-20 LAB — CALCIUM: Calcium: 10.1 mg/dL (ref 8.4–10.5)

## 2022-09-20 LAB — PHOSPHORUS: Phosphorus: 3.5 mg/dL (ref 2.3–4.6)

## 2022-09-20 LAB — ALKALINE PHOSPHATASE: Alkaline Phosphatase: 61 U/L (ref 39–117)

## 2022-09-20 LAB — VITAMIN D 25 HYDROXY (VIT D DEFICIENCY, FRACTURES): VITD: 22.96 ng/mL — ABNORMAL LOW (ref 30.00–100.00)

## 2022-09-20 NOTE — Progress Notes (Signed)
Spirometry/Diffusion capacity performed today. 

## 2022-09-20 NOTE — Research (Signed)
[place]:  An Exploratory Assessment of Bone Disease in Idiopathic Pulmonary Fibrosis    Primary Endpoint  The primary objectives of this exploratory study are to broadly estimate: the prevalence and severity of osteopenia and osteoporosis in those with IPF, and the prevalence of vertebral fragility fracture(s) in those with IPF.  Study Code: [id number] Protocol Edition: Number 2.0, Date 11 May 2022 Sponsor Name: [employer] Primary Investigator: Dr. Brand Males  Protocol Version for 09/20/2022 date of 11 May 2022 Consent Version for 09/20/2022 date of 03 Aug 2022     Key Feature of Study: This is an exploratory study to broadly assess bone disease in those with IPF. Participants are expected to attend one visit consisting of informed consent, medical history, lab blood draw, lateral thoracic and lumbar x-rays, and a DXA bone density scan. X-rays and DXA scan may be done on the same day as screening visit or within 7 days after screening. Study participation is concluded once visit, labs, and radiologic studies completed.  Key Inclusion Criteria:   1. Male or male participants aged 84 years or older with mild-to-moderate IPF (defined as FVC >= 45% predicted at most recent measurement). 2.. Negative pregnancy test (high sensitivity urine pregnancy test) for male participants prior to imaging.  Key Exclusion Criteria: 1. Non-IPF interstitial lung disease. 2. (FEV1))/FVC ratio <0.70. 3. Active enrollment or recent participation (ie, within 3 months or less than 5 half-lives of investigational medical product whichever is longer) in an interventional clinical trial where participants received an investigational medical product or placebo. 4. Current systemic corticosteroid treatment. 5. Prior history of hip, thoracic spine, or lumbar spine surgery if residual hardware remains within these areas. 6. Prior history of single or bilateral lung transplantation. 7. Current or prior  history of a hematologic malignancy or metastatic cancer. 8. Nuclear medicine study within the past 1 week or an X-ray procedure using contrast within the past 72 hours. 9. Individuals who have had calcium supplements within 24 hours of the dual X-ray absorptiometry (DXA) scan. Note: Individuals using calcium supplements are not excluded if they temporarily discontinue their calcium 24 hours prior to their DXA scan. Calcium supplements may be resumed following their DXA scan. 13. Women who are pregnant.  Safety Data: Not Applicable.  No interventional product given.   [facility] [place] Coordinator note:   This visit for Subject Marcus [name] with DOB: June 04, 1941 on 09/20/2022 for the above protocol is Visit/Encounter # 1  and is for purpose of research.   The consent for this encounter is under Protocol Version 2.0 and is currently IRB approved.   Subject expressed continued interest and consent in continuing as a study subject.  Subject thanked for participation in research and contribution to science. In this visit 09/20/2022 the subject will be evaluated by  Principal Investigator named Dr. Chase Caller. This research coordinator has verified that the above investigator is  up to date with his/her training logs.   The Subject was   informed that the PI  continues to have oversight of the subject's visits and course through relevant discussions, reviews, and also specifically of this visit by routing of this note to the PI.   This visit is a key visit of  screening. The PI is available for this visit.    Please see subject binder for additional information regarding this research visit.   Signed by  Darius Bump Pool  Clinical Research Coordinator  [facility]  [place], [place] 12:06 PM 09/20/2022

## 2022-09-20 NOTE — Progress Notes (Deleted)
[facility]:  An Exploratory Assessment of Bone Disease in Idiopathic Pulmonary Fibrosis    Primary Endpoint  The primary objectives of this exploratory study are to broadly estimate: the prevalence and severity of osteopenia and osteoporosis in those with IPF, and the prevalence of vertebral fragility fracture(s) in those with IPF.  Study Code: [id number] Protocol Edition: Number 2.0, Date 11 May 2022 Sponsor Name: [employer] Primary Investigator: Dr. Brand Males  Protocol Version for 09/20/2022 date of 11 May 2022   Consent Version for 09/20/2022 date of 03 Aug 2022  Key Feature of Study: This is an exploratory study to broadly assess bone disease in those with IPF. Participants are expected to attend one visit consisting of informed consent, medical history, lab blood draw, lateral thoracic and lumbar x-rays, and a DXA bone density scan. X-rays and DXA scan may be done on the same day as screening visit or within 7 days after screening. Study participation is concluded once visit, labs, and radiologic studies completed.  Key Inclusion Criteria:   1. Male or male participants aged 81 years or older with mild-to-moderate IPF (defined as FVC >= 45% predicted at most recent measurement). 2.. Negative pregnancy test (high sensitivity urine pregnancy test) for male participants prior to imaging.  Key Exclusion Criteria: 1. Non-IPF interstitial lung disease. 2. (FEV1))/FVC ratio <0.70. 3. Active enrollment or recent participation (ie, within 3 months or less than 5 half-lives of investigational medical product whichever is longer) in an interventional clinical trial where participants received an investigational medical product or placebo. 4. Current systemic corticosteroid treatment. 5. Prior history of hip, thoracic spine, or lumbar spine surgery if residual hardware remains within these areas. 6. Prior history of single or bilateral lung transplantation. 7. Current or prior history  of a hematologic malignancy or metastatic cancer. 8. Nuclear medicine study within the past 1 week or an X-ray procedure using contrast within the past 72 hours. 9. Individuals who have had calcium supplements within 24 hours of the dual X-ray absorptiometry (DXA) scan. Note: Individuals using calcium supplements are not excluded if they temporarily discontinue their calcium 24 hours prior to their DXA scan. Calcium supplements may be resumed following their DXA scan. 9. Women who are pregnant.  Safety Data: Not Applicable.  No interventional product given.   [facility] [place] Coordinator note:   This visit for Subject Marcus [name] with DOB: 04-Jan-1940 on 09/20/2022 for the above protocol is Visit/Encounter # 1  and is for purpose of research.   The consent for this encounter is under Protocol Version 2.0,  Consent Version 1.0 and is currently IRB approved.   Subject expressed continued interest and consent in continuing as a study subject.  Subject thanked for participation in research and contribution to science. In this visit 09/20/2022 the subject will be evaluated by  Principal Investigator  named Dr. Chase Caller. This research coordinator has verified that the above investigator is up to date with his/her training logs.   The Subject was  informed that the PI  continues to have oversight of the subject's visits and course through relevant discussions, reviews, and also specifically of this visit by routing of this note to the PI.   This visit is a key visit of  screening. The PI is  available for this visit.    Please see subject binder for additional information.   Signed by  Darius Bump Pool  Clinical Research Coordinator [facility]  [place], [place] 11:49 AM 09/20/2022

## 2022-09-20 NOTE — Patient Instructions (Signed)
Spirometry/Diffusion capacity performed today.

## 2022-09-22 ENCOUNTER — Ambulatory Visit (HOSPITAL_BASED_OUTPATIENT_CLINIC_OR_DEPARTMENT_OTHER)
Admission: RE | Admit: 2022-09-22 | Discharge: 2022-09-22 | Disposition: A | Discharge status: Home / Self Care | Payer: Self-pay | Source: Ambulatory Visit | Attending: Internal Medicine | Admitting: Internal Medicine

## 2022-09-22 DIAGNOSIS — M5136 Other intervertebral disc degeneration, lumbar region: Secondary | ICD-10-CM | POA: Diagnosis not present

## 2022-09-22 DIAGNOSIS — J84112 Idiopathic pulmonary fibrosis: Secondary | ICD-10-CM

## 2022-09-22 DIAGNOSIS — Z006 Encounter for examination for normal comparison and control in clinical research program: Secondary | ICD-10-CM

## 2022-09-22 DIAGNOSIS — Z13820 Encounter for screening for osteoporosis: Secondary | ICD-10-CM | POA: Diagnosis not present

## 2022-09-26 ENCOUNTER — Telehealth: Payer: Self-pay | Admitting: Internal Medicine

## 2022-09-26 NOTE — Telephone Encounter (Signed)
Vit D low DEXA bone scan normal T spine and L spone shows moderate changes of aging and loss of height  Plan - talk to PCP Marcus Pepper, MD abut vitamin d replacement and spine xray    Latest Reference Range & Units 09/20/22 11:30  VITD 30.00 - 100.00 ng/mL 22.96 (L)  (L): Data is abnormally low

## 2022-09-26 NOTE — Telephone Encounter (Signed)
Called and spoke with pt letting him know the results of recent testing and recs per MR and he verbalized understanding. Nothing further needed.

## 2022-09-30 DIAGNOSIS — Z87891 Personal history of nicotine dependence: Secondary | ICD-10-CM | POA: Diagnosis not present

## 2022-09-30 DIAGNOSIS — Z9049 Acquired absence of other specified parts of digestive tract: Secondary | ICD-10-CM | POA: Diagnosis not present

## 2022-09-30 DIAGNOSIS — K113 Abscess of salivary gland: Secondary | ICD-10-CM | POA: Diagnosis not present

## 2022-09-30 DIAGNOSIS — A4901 Methicillin susceptible Staphylococcus aureus infection, unspecified site: Secondary | ICD-10-CM | POA: Diagnosis not present

## 2022-09-30 DIAGNOSIS — D119 Benign neoplasm of major salivary gland, unspecified: Secondary | ICD-10-CM | POA: Diagnosis not present

## 2022-09-30 DIAGNOSIS — K1120 Sialoadenitis, unspecified: Secondary | ICD-10-CM | POA: Diagnosis not present

## 2022-10-19 DIAGNOSIS — H53483 Generalized contraction of visual field, bilateral: Secondary | ICD-10-CM | POA: Diagnosis not present

## 2022-10-20 DIAGNOSIS — H57813 Brow ptosis, bilateral: Secondary | ICD-10-CM | POA: Diagnosis not present

## 2022-10-20 DIAGNOSIS — H02132 Senile ectropion of right lower eyelid: Secondary | ICD-10-CM | POA: Diagnosis not present

## 2022-10-20 DIAGNOSIS — H02421 Myogenic ptosis of right eyelid: Secondary | ICD-10-CM | POA: Diagnosis not present

## 2022-10-20 DIAGNOSIS — H53483 Generalized contraction of visual field, bilateral: Secondary | ICD-10-CM | POA: Diagnosis not present

## 2022-10-20 DIAGNOSIS — H02412 Mechanical ptosis of left eyelid: Secondary | ICD-10-CM | POA: Diagnosis not present

## 2022-10-20 DIAGNOSIS — H02411 Mechanical ptosis of right eyelid: Secondary | ICD-10-CM | POA: Diagnosis not present

## 2022-10-20 DIAGNOSIS — H02831 Dermatochalasis of right upper eyelid: Secondary | ICD-10-CM | POA: Diagnosis not present

## 2022-10-20 DIAGNOSIS — H02423 Myogenic ptosis of bilateral eyelids: Secondary | ICD-10-CM | POA: Diagnosis not present

## 2022-10-20 DIAGNOSIS — H0279 Other degenerative disorders of eyelid and periocular area: Secondary | ICD-10-CM | POA: Diagnosis not present

## 2022-10-20 DIAGNOSIS — H02834 Dermatochalasis of left upper eyelid: Secondary | ICD-10-CM | POA: Diagnosis not present

## 2022-10-20 DIAGNOSIS — H02413 Mechanical ptosis of bilateral eyelids: Secondary | ICD-10-CM | POA: Diagnosis not present

## 2022-10-20 DIAGNOSIS — H02422 Myogenic ptosis of left eyelid: Secondary | ICD-10-CM | POA: Diagnosis not present

## 2022-10-24 ENCOUNTER — Telehealth: Payer: Self-pay | Admitting: *Deleted

## 2022-10-24 NOTE — Telephone Encounter (Signed)
   Name: Marcus [name]  DOB: 27-May-1941  MRN: [id number]  Primary Cardiologist: Fransico Him, MD   Preoperative team, please contact this patient and set up a phone call appointment for further preoperative risk assessment. Please obtain consent and complete medication review. Thank you for your help.  I confirm that guidance regarding antiplatelet and oral anticoagulation therapy has been completed and, if necessary, noted below.  Per office protocol, he can hold his ASA x 7 days prior to the procedure. Please restart when medically safe to do so     Mable Fill, Marissa Nestle, NP 10/24/2022, 5:53 PM [place]

## 2022-10-24 NOTE — Telephone Encounter (Signed)
   Pre-operative Risk Assessment    Patient Name: Demerius Podolak  DOB: [birth year] MRN: [id number]      Request for Surgical Clearance    Procedure:   B/L EXTERNAL PTOSIS REPAIR  Date of Surgery:  Clearance 12/28/22                                 Surgeon:  DR. TAL RUBINSTEIN Surgeon's Group or Practice Name:  [facility]  Phone number:  [phone] Fax number:  [phone]   Type of Clearance Requested:   - Medical ; ASA    Type of Anesthesia:  MAC   Additional requests/questions:    [name]   10/24/2022, 4:52 PM

## 2022-10-25 ENCOUNTER — Telehealth: Payer: Self-pay | Admitting: *Deleted

## 2022-10-25 DIAGNOSIS — Z6837 Body mass index (BMI) 37.0-37.9, adult: Secondary | ICD-10-CM | POA: Diagnosis not present

## 2022-10-25 DIAGNOSIS — N1832 Chronic kidney disease, stage 3b: Secondary | ICD-10-CM | POA: Diagnosis not present

## 2022-10-25 NOTE — Telephone Encounter (Signed)
Pt has been scheduled for tele pre op appt 11/28/22 @ 10:20. Med rec and consent are done.     Patient Consent for Virtual Visit        [name] has provided verbal consent on 10/25/2022 for a virtual visit (video or telephone).   CONSENT FOR VIRTUAL VISIT FOR:  Marcus [name]  By participating in this virtual visit I agree to the following:  I hereby voluntarily request, consent and authorize [facility] and its employed or contracted physicians, physician assistants, nurse practitioners or other licensed health care professionals (the Practitioner), to provide me with telemedicine health care services (the "Services") as deemed necessary by the treating Practitioner. I acknowledge and consent to receive the Services by the Practitioner via telemedicine. I understand that the telemedicine visit will involve communicating with the Practitioner through live audiovisual communication technology and the disclosure of certain medical information by electronic transmission. I acknowledge that I have been given the opportunity to request an in-person assessment or other available alternative prior to the telemedicine visit and am voluntarily participating in the telemedicine visit.  I understand that I have the right to withhold or withdraw my consent to the use of telemedicine in the course of my care at any time, without affecting my right to future care or treatment, and that the Practitioner or I may terminate the telemedicine visit at any time. I understand that I have the right to inspect all information obtained and/or recorded in the course of the telemedicine visit and may receive copies of available information for a reasonable fee.  I understand that some of the potential risks of receiving the Services via telemedicine include:  Delay or interruption in medical evaluation due to technological equipment failure or disruption; Information transmitted may not be sufficient (e.g.  poor resolution of images) to allow for appropriate medical decision making by the Practitioner; and/or  In rare instances, security protocols could fail, causing a breach of personal health information.  Furthermore, I acknowledge that it is my responsibility to provide information about my medical history, conditions and care that is complete and accurate to the best of my ability. I acknowledge that Practitioner's advice, recommendations, and/or decision may be based on factors not within their control, such as incomplete or inaccurate data provided by me or distortions of diagnostic images or specimens that may result from electronic transmissions. I understand that the practice of medicine is not an exact science and that Practitioner makes no warranties or guarantees regarding treatment outcomes. I acknowledge that a copy of this consent can be made available to me via my patient portal ([facility]), or I can request a printed copy by calling the office of [place] [facility].    I understand that my insurance will be billed for this visit.   I have read or had this consent read to me. I understand the contents of this consent, which adequately explains the benefits and risks of the Services being provided via telemedicine.  I have been provided ample opportunity to ask questions regarding this consent and the Services and have had my questions answered to my satisfaction. I give my informed consent for the services to be provided through the use of telemedicine in my medical care

## 2022-10-25 NOTE — Telephone Encounter (Signed)
Pt has been scheduled for tele pre op appt 11/28/22 @ 10:20. Med rec and consent are done.

## 2022-11-11 DIAGNOSIS — I25119 Atherosclerotic heart disease of native coronary artery with unspecified angina pectoris: Secondary | ICD-10-CM | POA: Diagnosis not present

## 2022-11-11 DIAGNOSIS — N1832 Chronic kidney disease, stage 3b: Secondary | ICD-10-CM | POA: Diagnosis not present

## 2022-11-11 DIAGNOSIS — J8410 Pulmonary fibrosis, unspecified: Secondary | ICD-10-CM | POA: Diagnosis not present

## 2022-11-11 DIAGNOSIS — E559 Vitamin D deficiency, unspecified: Secondary | ICD-10-CM | POA: Diagnosis not present

## 2022-11-11 DIAGNOSIS — I10 Essential (primary) hypertension: Secondary | ICD-10-CM | POA: Diagnosis not present

## 2022-11-11 DIAGNOSIS — I77810 Thoracic aortic ectasia: Secondary | ICD-10-CM | POA: Diagnosis not present

## 2022-11-11 DIAGNOSIS — I700 Atherosclerosis of aorta: Secondary | ICD-10-CM | POA: Diagnosis not present

## 2022-11-11 DIAGNOSIS — I739 Peripheral vascular disease, unspecified: Secondary | ICD-10-CM | POA: Diagnosis not present

## 2022-11-11 DIAGNOSIS — R7301 Impaired fasting glucose: Secondary | ICD-10-CM | POA: Diagnosis not present

## 2022-11-11 DIAGNOSIS — Z0000 Encounter for general adult medical examination without abnormal findings: Secondary | ICD-10-CM | POA: Diagnosis not present

## 2022-11-11 DIAGNOSIS — E782 Mixed hyperlipidemia: Secondary | ICD-10-CM | POA: Diagnosis not present

## 2022-11-11 DIAGNOSIS — D649 Anemia, unspecified: Secondary | ICD-10-CM | POA: Diagnosis not present

## 2022-11-28 ENCOUNTER — Ambulatory Visit: Payer: Medicare HMO | Attending: Internal Medicine | Admitting: Student

## 2022-11-28 DIAGNOSIS — Z01810 Encounter for preprocedural cardiovascular examination: Secondary | ICD-10-CM

## 2022-11-28 NOTE — Progress Notes (Signed)
Virtual Visit via Telephone Note   Because of Marcus [name]'s co-morbid illnesses, he is at least at moderate risk for complications without adequate follow up.  This format is felt to be most appropriate for this patient at this time.  The patient did not have access to video technology/had technical difficulties with video requiring transitioning to audio format only (telephone).  All issues noted in this document were discussed and addressed.  No physical exam could be performed with this format.  Please refer to the patient's chart for his consent to telehealth for Marcus [name].  Evaluation Performed:  Preoperative cardiovascular risk assessment _____________   Date:  11/28/2022   Patient ID:  Marcus [name], DOB Jan 24, 1941, MRN [id number] Patient Location:  Home Provider location:   Office  Primary Care Provider:  London Pepper, MD Primary Cardiologist:  Marcus Him, MD  Chief Complaint / Patient Profile   82 y.o. y/o male with a h/o CAD s/p remote PCI of LAD, normal nuclear stress test 2021, PVD, ascending aortic dilatation, chronic diastolic heart failure, interstitial lung disease, hypertension, hyperlipidemia, CKD stage III who is pending B/L external ptosis repair by Dr. Sabino [name] and presents today for telephonic preoperative cardiovascular risk assessment.  History of Present Illness    Marcus [name] is a 82 y.o. male who presents via audio/video conferencing for a telehealth visit today.  Pt was last seen in cardiology clinic on 07/27/2022 by Marcus [name].  At that time Marcus [name] was doing well.  The patient is now pending procedure as outlined above. Since his last visit, he is doing well from a cardiac standpoint. Patient denies chest pain, pressure, or tightness. Denies lower extremity edema, orthopnea, or PND. No palpitations. Shortness of breath and dyspnea are at baseline secondary to pulmonary fibrosis. He reports he is not very active secondary to his  breathing issues and problems with his vision. He is able to take care of his personal hygiene and ADLs independently. He can climb a flight of stairs without chest pain or increased dyspnea. He helps his wife with grocery shopping occasionally.    Past Medical History    Past Medical History:  Diagnosis Date   Ascending aorta dilatation ([place])    26mm gy echo 01/2020   Benign essential HTN 09/09/2013   Chronic kidney disease    Coronary artery disease 08/2013   S/P PCI of the LAD   Dyslipidemia    Elevated fasting glucose    Fatty liver    Glaucoma    Gout    Hyperlipidemia    Hypertension    Joint pain    LAD (lymphadenopathy)    Obesity    Pure hypercholesterolemia 09/09/2013   PVD (peripheral vascular disease) ([place]) 06/12/2018     30-49% left common femoral stenosis   Vision abnormalities    Past Surgical History:  Procedure Laterality Date   ADENOIDECTOMY     CATARACT EXTRACTION     CORONARY ANGIOPLASTY WITH STENT PLACEMENT  2009   EYE SURGERY     TONSILLECTOMY     WISDOM TOOTH EXTRACTION      Allergies  Allergies  Allergen Reactions   Pirfenidone Rash   Ofev [Nintedanib] Diarrhea    Home Medications    Prior to Admission medications   Medication Sig Start Date End Date Taking? Authorizing Provider  allopurinol (ZYLOPRIM) 300 MG tablet Take 300 mg by mouth daily.    [provider]  amLODipine (NORVASC) 10 MG tablet Take 10 mg by mouth  daily.    [provider]  aspirin 81 MG tablet Take 81 mg by mouth daily.    [provider]  atorvastatin (LIPITOR) 80 MG tablet Take 1 tablet (80 mg total) by mouth daily. 07/27/22   Sueanne Margarita, MD  cholecalciferol (VITAMIN D3) 25 MCG (1000 UNIT) tablet Take 1,000 Units by mouth daily.    [provider]  Cyanocobalamin (VITAMIN B-12 PO) Take 1 capsule by mouth daily.    [provider]  Dextromethorphan-guaiFENesin [facility] DM PO) Take 1 tablet by mouth daily.    [provider]  dorzolamide-timolol (COSOPT) 22.3-6.8 MG/ML ophthalmic solution Place 1 drop into the left eye 2 (two) times daily. 05/02/16   [provider]  ferrous sulfate 325 (65 FE) MG EC tablet Take 325 mg by mouth 3 (three) times daily with meals.    [provider]  fluticasone (FLONASE) 50 MCG/ACT nasal spray Place 2 sprays into both nostrils as needed. Patient not taking: Reported on 10/25/2022    [provider]  latanoprost (XALATAN) 0.005 % ophthalmic solution Place 1 drop into the left eye daily. 01/27/16   [provider]  metoprolol succinate (TOPROL-XL) 100 MG 24 hr tablet Take with or immediately following a meal. 07/27/22   Turner, Eber Hong, MD  Netarsudil-Latanoprost (ROCKLATAN) 0.02-0.005 % SOLN As directed in left eye    [provider]  nitroGLYCERIN (NITROSTAT) 0.4 MG SL tablet Place 0.4 mg under the tongue every 5 (five) minutes as needed for chest pain.  Patient not taking: Reported on 10/25/2022    [provider]  Omega-3 Fatty Acids (OMEGA 3 PO) Take 2 capsules by mouth in the morning and at bedtime. 2000 mg BID    [provider]  pimecrolimus (ELIDEL) 1 % cream Apply 1 Application topically 2 (two) times daily. To affected area on the face 01/11/19   [provider]  tamsulosin (FLOMAX) 0.4 MG CAPS capsule Take 0.4 mg by mouth daily. 04/27/20   [provider]    Physical Exam    Vital Signs:  Marcus [name] does not have vital signs available for review today.  Given telephonic nature of communication, physical exam is limited. AAOx3. NAD. Normal affect.  Speech and respirations are unlabored.  Accessory Clinical Findings    None  Assessment & Plan    Primary Cardiologist: Marcus Him, MD  Preoperative cardiovascular risk assessment. B/L external ptosis repair with Dr. Sabino [name].   Chart reviewed as part of pre-operative protocol coverage. According to the RCRI, patient has a  6.6% risk of MACE. Patient reports activity equivalent to 4.0 METS (Personal hygiene, ADLs, walking stairs at his home, helping with grocery shopping).   Given past medical history and time since last visit, based on ACC/AHA guidelines, Marcus [name] would be at acceptable risk for the planned procedure without further cardiovascular testing.   Patient was advised that if he develops new symptoms prior to surgery to contact our office to arrange a follow-up appointment.  he verbalized understanding.  Ideally aspirin should be continued without interruption, however if the bleeding risk is too great, aspirin may be held for 7 days prior to surgery. Please resume aspirin post operatively when it is felt to be safe from a bleeding standpoint.    I will route this recommendation to the requesting party via Epic fax function.  Please call with questions.  Time:   Today, I have spent 9 minutes with the patient with telehealth technology discussing medical  history, symptoms, and management plan.     Mayra Reel, NP  11/28/2022, 7:41 AM

## 2022-12-08 ENCOUNTER — Other Ambulatory Visit: Payer: Self-pay

## 2022-12-08 DIAGNOSIS — H401133 Primary open-angle glaucoma, bilateral, severe stage: Secondary | ICD-10-CM | POA: Diagnosis not present

## 2022-12-08 MED ORDER — AMLODIPINE BESYLATE 10 MG PO TABS: 10.0000 mg | ORAL_TABLET | Freq: Every day | ORAL | 3 refills | Status: DC

## 2022-12-28 DIAGNOSIS — H0279 Other degenerative disorders of eyelid and periocular area: Secondary | ICD-10-CM | POA: Diagnosis not present

## 2022-12-28 DIAGNOSIS — H01115 Allergic dermatitis of left lower eyelid: Secondary | ICD-10-CM | POA: Diagnosis not present

## 2022-12-28 DIAGNOSIS — H02423 Myogenic ptosis of bilateral eyelids: Secondary | ICD-10-CM | POA: Diagnosis not present

## 2022-12-28 DIAGNOSIS — H02535 Eyelid retraction left lower eyelid: Secondary | ICD-10-CM | POA: Diagnosis not present

## 2022-12-28 DIAGNOSIS — H02135 Senile ectropion of left lower eyelid: Secondary | ICD-10-CM | POA: Diagnosis not present

## 2022-12-28 DIAGNOSIS — H02132 Senile ectropion of right lower eyelid: Secondary | ICD-10-CM | POA: Diagnosis not present

## 2022-12-28 DIAGNOSIS — H01114 Allergic dermatitis of left upper eyelid: Secondary | ICD-10-CM | POA: Diagnosis not present

## 2022-12-28 DIAGNOSIS — H57813 Brow ptosis, bilateral: Secondary | ICD-10-CM | POA: Diagnosis not present

## 2022-12-28 DIAGNOSIS — H53483 Generalized contraction of visual field, bilateral: Secondary | ICD-10-CM | POA: Diagnosis not present

## 2022-12-28 DIAGNOSIS — H16213 Exposure keratoconjunctivitis, bilateral: Secondary | ICD-10-CM | POA: Diagnosis not present

## 2022-12-28 DIAGNOSIS — H02532 Eyelid retraction right lower eyelid: Secondary | ICD-10-CM | POA: Diagnosis not present

## 2022-12-28 DIAGNOSIS — H04223 Epiphora due to insufficient drainage, bilateral lacrimal glands: Secondary | ICD-10-CM | POA: Diagnosis not present

## 2023-02-13 DIAGNOSIS — H53483 Generalized contraction of visual field, bilateral: Secondary | ICD-10-CM | POA: Diagnosis not present

## 2023-02-15 ENCOUNTER — Telehealth: Payer: Self-pay | Admitting: *Deleted

## 2023-02-15 NOTE — Telephone Encounter (Signed)
Judeth Cornfield with [facility] called back and did let me know the pt does have another procedure to be done. Judeth Cornfield had stated they had given the pt a form to drop by the office, though Judeth Cornfield will fax over to me [phone] attn: pre op team.

## 2023-02-15 NOTE — Telephone Encounter (Signed)
   Pre-operative Risk Assessment    Patient Name: Marcus [name]  DOB: 1940/10/04 MRN: [id number]      Request for Surgical Clearance    Procedure:   B/L BROW PTOSIS REPAIR, B/L UPPER EYELID ENTROPION REPAIR, LEFT TARSORRHAPHY  Date of Surgery:  Clearance 04/19/23                                 Surgeon:  DR. TAL RUBINSTEIN Surgeon's Group or Practice Name:  [facility] Phone number:  [phone] ATTN: Derry Lory JENNA Fax number:  [phone]   Type of Clearance Requested:   - Medical ; ASA    Type of Anesthesia:  MAC   Additional requests/questions:    Elpidio Anis   02/15/2023, 4:43 PM

## 2023-02-15 NOTE — Telephone Encounter (Signed)
Jenna from [facility] called and left a vm today stating she was returning my call. The last notes that are in the chart for a clearance from Dr. Delynn Flavin office [facility] was a clearance received 10/24/22. We then cleared the pt on 11/28/22 for a procedure to be done on 12/28/22. I do not see anything new from their office. I left a message for Eileen Stanford to call back and confirm if maybe they never received the clearance notes from 11/28/22. If so we can re-fax.

## 2023-02-16 ENCOUNTER — Telehealth: Payer: Self-pay | Admitting: *Deleted

## 2023-02-16 NOTE — Telephone Encounter (Signed)
   Name: Marcus [name]  DOB: 1941-03-15  MRN: [id number]  Primary Cardiologist: Armanda Magic, MD  Chart reviewed as part of pre-operative protocol coverage. Because of [name]'s past medical history and time since last visit, he will require a follow-up telephone visit in order to better assess preoperative cardiovascular risk.  Pre-op covering staff: - Please schedule appointment and call patient to inform them. If patient already had an upcoming appointment within acceptable timeframe, please add "pre-op clearance" to the appointment notes so provider is aware. - Please contact requesting surgeon's office via preferred method (i.e, phone, fax) to inform them of need for appointment prior to surgery.  Would prefer aspirin to be continued throughout, but if bleeding risk is too high may hold aspirin x 7 days prior to the procedure and restart when medically safe to do so.  Sharlene Dory, PA-C  02/16/2023, 8:40 AM

## 2023-02-16 NOTE — Telephone Encounter (Signed)
Pt has been scheduled for tele pre op appt 03/27/23 @ 10 am. Med rec and consent are done.

## 2023-02-16 NOTE — Telephone Encounter (Signed)
Pt has been scheduled for tele pre op appt 03/27/23 @ 10 am. Med rec and consent are done.     Patient Consent for Virtual Visit        Marcus [name] has provided verbal consent on 02/16/2023 for a virtual visit (video or telephone).   CONSENT FOR VIRTUAL VISIT FOR:  Marcus [name]  By participating in this virtual visit I agree to the following:  I hereby voluntarily request, consent and authorize [facility] and its employed or contracted physicians, physician assistants, nurse practitioners or other licensed health care professionals (the Practitioner), to provide me with telemedicine health care services (the "Services") as deemed necessary by the treating Practitioner. I acknowledge and consent to receive the Services by the Practitioner via telemedicine. I understand that the telemedicine visit will involve communicating with the Practitioner through live audiovisual communication technology and the disclosure of certain medical information by electronic transmission. I acknowledge that I have been given the opportunity to request an in-person assessment or other available alternative prior to the telemedicine visit and am voluntarily participating in the telemedicine visit.  I understand that I have the right to withhold or withdraw my consent to the use of telemedicine in the course of my care at any time, without affecting my right to future care or treatment, and that the Practitioner or I may terminate the telemedicine visit at any time. I understand that I have the right to inspect all information obtained and/or recorded in the course of the telemedicine visit and may receive copies of available information for a reasonable fee.  I understand that some of the potential risks of receiving the Services via telemedicine include:  Delay or interruption in medical evaluation due to technological equipment failure or disruption; Information transmitted may not be sufficient (e.g.  poor resolution of images) to allow for appropriate medical decision making by the Practitioner; and/or  In rare instances, security protocols could fail, causing a breach of personal health information.  Furthermore, I acknowledge that it is my responsibility to provide information about my medical history, conditions and care that is complete and accurate to the best of my ability. I acknowledge that Practitioner's advice, recommendations, and/or decision may be based on factors not within their control, such as incomplete or inaccurate data provided by me or distortions of diagnostic images or specimens that may result from electronic transmissions. I understand that the practice of medicine is not an exact science and that Practitioner makes no warranties or guarantees regarding treatment outcomes. I acknowledge that a copy of this consent can be made available to me via my patient portal [facility] MyChart), or I can request a printed copy by calling the office of [facility].    I understand that my insurance will be billed for this visit.   I have read or had this consent read to me. I understand the contents of this consent, which adequately explains the benefits and risks of the Services being provided via telemedicine.  I have been provided ample opportunity to ask questions regarding this consent and the Services and have had my questions answered to my satisfaction. I give my informed consent for the services to be provided through the use of telemedicine in my medical care

## 2023-03-27 ENCOUNTER — Ambulatory Visit: Payer: Medicare HMO | Attending: Cardiovascular Disease

## 2023-03-27 DIAGNOSIS — Z01810 Encounter for preprocedural cardiovascular examination: Secondary | ICD-10-CM

## 2023-03-27 NOTE — Addendum Note (Signed)
Addended by: Ronney Asters on: 03/27/2023 10:58 AM   Modules accepted: Level of Service

## 2023-03-27 NOTE — Telephone Encounter (Signed)
I s/w the pt who missed his tele appt today. Pt said his phone blocks calls that are not in his phone. He has been rescheduled to 03/30/23 and be sure to unblock his phone before the call so he does not miss it.

## 2023-03-27 NOTE — Progress Notes (Signed)
Attempted to contact patient x 3 as part of preoperative protocol.  3 voice messages left.  Patient will have to reschedule preoperative cardiac evaluation visit.  Thomasene Ripple. Malina Geers NP-C     03/27/2023, 10:57 AM [facility] [address] Office [phone] Fax [phone]

## 2023-03-30 ENCOUNTER — Ambulatory Visit: Payer: Medicare HMO | Attending: Internal Medicine | Admitting: Nurse Practitioner

## 2023-03-30 DIAGNOSIS — Z01810 Encounter for preprocedural cardiovascular examination: Secondary | ICD-10-CM

## 2023-03-30 NOTE — Progress Notes (Signed)
Virtual Visit via Telephone Note   Because of Marcus [name]'s co-morbid illnesses, he is at least at moderate risk for complications without adequate follow up.  This format is felt to be most appropriate for this patient at this time.  The patient did not have access to video technology/had technical difficulties with video requiring transitioning to audio format only (telephone).  All issues noted in this document were discussed and addressed.  No physical exam could be performed with this format.  Please refer to the patient's chart for his consent to telehealth for [facility]  Evaluation Performed:  Preoperative cardiovascular risk assessment _____________   Date:  03/30/2023   Patient ID:  Marcus [name], DOB 08/21/1941, MRN [id number] Patient Location:  Home Provider location:   Office  Primary Care Provider:  Farris Has, MD Primary Cardiologist:  Armanda Magic, MD  Chief Complaint / Patient Profile   82 y.o. y/o male with a h/o CAD s/p PCI-LAD, ascending aorta dilation, PVD, ILD, hypertension, hyperlipidemia, CKD stage III, and OSA who is pending B/L BROW PTOSIS REPAIR, B/L UPPER EYELID ENTROPION REPAIR, LEFT TARSORRHAPHY on 04/19/2023 with Dr. Galen Manila of [facility] and presents today for telephonic preoperative cardiovascular risk assessment.  History of Present Illness    Marcus [name] is a 82 y.o. male who presents via audio/video conferencing for a telehealth visit today.  Pt was last seen in cardiology clinic on 07/27/2022 by Dr. Mayford Knife.  At that time Marcus [name] was doing well.  The patient is now pending procedure as outlined above. Since his last visit, he has done well from a cardiac standpoint.  Stable intermittent dyspnea in the setting of pulmonary fibrosis/ILD.  He denies chest pain, palpitations, pnd, orthopnea, n, v, dizziness, syncope, edema, weight gain, or early satiety. All other systems reviewed and are otherwise negative except as  noted above.   Past Medical History    Past Medical History:  Diagnosis Date   Ascending aorta dilatation (HCC)    38mm gy echo 01/2020   Benign essential HTN 09/09/2013   Chronic kidney disease    Coronary artery disease 08/2013   S/P PCI of the LAD   Dyslipidemia    Elevated fasting glucose    Fatty liver    Glaucoma    Gout    Hyperlipidemia    Hypertension    Joint pain    LAD (lymphadenopathy)    Obesity    Pure hypercholesterolemia 09/09/2013   PVD (peripheral vascular disease) (HCC) 06/12/2018     30-49% left common femoral stenosis   Vision abnormalities    Past Surgical History:  Procedure Laterality Date   ADENOIDECTOMY     CATARACT EXTRACTION     CORONARY ANGIOPLASTY WITH STENT PLACEMENT  2009   EYE SURGERY     TONSILLECTOMY     WISDOM TOOTH EXTRACTION      Allergies  Allergies  Allergen Reactions   Pirfenidone Rash   Ofev [Nintedanib] Diarrhea    Home Medications    Prior to Admission medications   Medication Sig Start Date End Date Taking? Authorizing Provider  allopurinol (ZYLOPRIM) 300 MG tablet Take 300 mg by mouth daily.    [provider]  amLODipine (NORVASC) 10 MG tablet Take 1 tablet (10 mg total) by mouth daily. 12/08/22   Carlos Levering, NP  aspirin 81 MG tablet Take 81 mg by mouth daily.    [provider]  atorvastatin (LIPITOR) 80 MG tablet Take 1 tablet (80 mg total)  by mouth daily. 07/27/22   Quintella Reichert, MD  cholecalciferol (VITAMIN D3) 25 MCG (1000 UNIT) tablet Take 1,000 Units by mouth daily.    [provider]  Cyanocobalamin (VITAMIN B-12 PO) Take 1 capsule by mouth daily.    [provider]  Dextromethorphan-guaiFENesin [facility] DM PO) Take 1 tablet by mouth daily.    [provider]  dorzolamide-timolol (COSOPT) 22.3-6.8 MG/ML ophthalmic solution Place 1 drop into the left eye 2 (two) times daily. 05/02/16   [provider]  ferrous sulfate 325 (65 FE) MG EC tablet  Take 325 mg by mouth 3 (three) times daily with meals.    [provider]  fluticasone (FLONASE) 50 MCG/ACT nasal spray Place 2 sprays into both nostrils as needed. Patient not taking: Reported on 10/25/2022    [provider]  latanoprost (XALATAN) 0.005 % ophthalmic solution Place 1 drop into the left eye daily. 01/27/16   [provider]  metoprolol succinate (TOPROL-XL) 100 MG 24 hr tablet Take with or immediately following a meal. 07/27/22   Turner, Cornelious Bryant, MD  Netarsudil-Latanoprost (ROCKLATAN) 0.02-0.005 % SOLN As directed in left eye    [provider]  nitroGLYCERIN (NITROSTAT) 0.4 MG SL tablet Place 0.4 mg under the tongue every 5 (five) minutes as needed for chest pain.  Patient not taking: Reported on 10/25/2022    [provider]  Omega-3 Fatty Acids (OMEGA 3 PO) Take 2 capsules by mouth in the morning and at bedtime. 2000 mg BID    [provider]  pimecrolimus (ELIDEL) 1 % cream Apply 1 Application topically 2 (two) times daily. To affected area on the face 01/11/19   [provider]  tamsulosin (FLOMAX) 0.4 MG CAPS capsule Take 0.4 mg by mouth daily. 04/27/20   [provider]    Physical Exam    Vital Signs:  Marcus [name] does not have vital signs available for review today.  Given telephonic nature of communication, physical exam is limited. AAOx3. NAD. Normal affect.  Speech and respirations are unlabored.  Accessory Clinical Findings    None  Assessment & Plan    1.  Preoperative Cardiovascular Risk Assessment:  According to the Revised Cardiac Risk Index (RCRI), his Perioperative Risk of Major Cardiac Event is (%): 0.9. His Functional Capacity in METs is: 5.62 according to the Duke Activity Status Index (DASI). Therefore, based on ACC/AHA guidelines, patient would be at acceptable risk for the planned procedure without further cardiovascular testing.   The patient was advised that if he develops  new symptoms prior to surgery to contact our office to arrange for a follow-up visit, and he verbalized understanding.  Regarding ASA therapy, we recommend continuation of ASA throughout the perioperative period.  However, if the surgeon feels that cessation of ASA is required in the perioperative period, it may be stopped 5-7 days prior to surgery with a plan to resume it as soon as felt to be feasible from a surgical standpoint in the post-operative period.   A copy of this note will be routed to requesting surgeon.  Time:   Today, I have spent 5 minutes with the patient with telehealth technology discussing medical history, symptoms, and management plan.     Joylene Grapes, NP  03/30/2023, 10:06 AM

## 2023-04-19 DIAGNOSIS — H57813 Brow ptosis, bilateral: Secondary | ICD-10-CM | POA: Diagnosis not present

## 2023-04-19 DIAGNOSIS — H16211 Exposure keratoconjunctivitis, right eye: Secondary | ICD-10-CM | POA: Diagnosis not present

## 2023-04-19 DIAGNOSIS — H02234 Paralytic lagophthalmos left upper eyelid: Secondary | ICD-10-CM | POA: Diagnosis not present

## 2023-04-19 DIAGNOSIS — H16213 Exposure keratoconjunctivitis, bilateral: Secondary | ICD-10-CM | POA: Diagnosis not present

## 2023-04-19 DIAGNOSIS — H02024 Mechanical entropion of left upper eyelid: Secondary | ICD-10-CM | POA: Diagnosis not present

## 2023-04-19 DIAGNOSIS — H02021 Mechanical entropion of right upper eyelid: Secondary | ICD-10-CM | POA: Diagnosis not present

## 2023-04-19 DIAGNOSIS — Z09 Encounter for follow-up examination after completed treatment for conditions other than malignant neoplasm: Secondary | ICD-10-CM | POA: Diagnosis not present

## 2023-04-19 DIAGNOSIS — H53483 Generalized contraction of visual field, bilateral: Secondary | ICD-10-CM | POA: Diagnosis not present

## 2023-04-19 DIAGNOSIS — H0279 Other degenerative disorders of eyelid and periocular area: Secondary | ICD-10-CM | POA: Diagnosis not present

## 2023-04-19 DIAGNOSIS — H02403 Unspecified ptosis of bilateral eyelids: Secondary | ICD-10-CM | POA: Diagnosis not present

## 2023-04-19 DIAGNOSIS — H16212 Exposure keratoconjunctivitis, left eye: Secondary | ICD-10-CM | POA: Diagnosis not present

## 2023-05-10 DIAGNOSIS — H401133 Primary open-angle glaucoma, bilateral, severe stage: Secondary | ICD-10-CM | POA: Diagnosis not present

## 2023-05-16 DIAGNOSIS — E782 Mixed hyperlipidemia: Secondary | ICD-10-CM | POA: Diagnosis not present

## 2023-05-16 DIAGNOSIS — R7301 Impaired fasting glucose: Secondary | ICD-10-CM | POA: Diagnosis not present

## 2023-05-16 DIAGNOSIS — G629 Polyneuropathy, unspecified: Secondary | ICD-10-CM | POA: Diagnosis not present

## 2023-05-16 DIAGNOSIS — E559 Vitamin D deficiency, unspecified: Secondary | ICD-10-CM | POA: Diagnosis not present

## 2023-05-16 DIAGNOSIS — N1831 Chronic kidney disease, stage 3a: Secondary | ICD-10-CM | POA: Diagnosis not present

## 2023-05-16 DIAGNOSIS — J8410 Pulmonary fibrosis, unspecified: Secondary | ICD-10-CM | POA: Diagnosis not present

## 2023-05-16 DIAGNOSIS — I25118 Atherosclerotic heart disease of native coronary artery with other forms of angina pectoris: Secondary | ICD-10-CM | POA: Diagnosis not present

## 2023-05-16 DIAGNOSIS — I10 Essential (primary) hypertension: Secondary | ICD-10-CM | POA: Diagnosis not present

## 2023-05-16 DIAGNOSIS — N400 Enlarged prostate without lower urinary tract symptoms: Secondary | ICD-10-CM | POA: Diagnosis not present

## 2023-05-16 DIAGNOSIS — M109 Gout, unspecified: Secondary | ICD-10-CM | POA: Diagnosis not present

## 2023-05-16 DIAGNOSIS — Z23 Encounter for immunization: Secondary | ICD-10-CM | POA: Diagnosis not present

## 2023-06-14 ENCOUNTER — Encounter: Payer: Self-pay | Admitting: Internal Medicine

## 2023-06-26 ENCOUNTER — Telehealth: Payer: Self-pay | Admitting: Cardiology

## 2023-06-26 NOTE — Telephone Encounter (Signed)
 *  STAT* If patient is at the pharmacy, call can be transferred to refill team.   1. Which medications need to be refilled? (please list name of each medication and dose if known)   atorvastatin (LIPITOR) 80 MG tablet  metoprolol succinate (TOPROL-XL) 100 MG 24 hr tablet  amLODipine (NORVASC) 10 MG tablet   2. Would you like to learn more about the convenience, safety, & potential cost savings by using the [facility]?    3. Are you open to using the [facility] (Type [facility]. ).   4. Which pharmacy/location (including street and city if local pharmacy) is medication to be sent to?  [facility] - [place], [place] - [address] AT Portal to Registered [facility] Sites   5. Do they need a 30 day or 90 day supply?  90

## 2023-06-29 ENCOUNTER — Telehealth: Payer: Self-pay | Admitting: Cardiology

## 2023-06-29 MED ORDER — ATORVASTATIN CALCIUM 80 MG PO TABS: 80.0000 mg | ORAL_TABLET | Freq: Every day | ORAL | 1 refills | Status: DC

## 2023-06-29 MED ORDER — METOPROLOL SUCCINATE ER 100 MG PO TB24: ORAL_TABLET | ORAL | 1 refills | Status: DC

## 2023-06-29 MED ORDER — AMLODIPINE BESYLATE 10 MG PO TABS: 10.0000 mg | ORAL_TABLET | Freq: Every day | ORAL | 1 refills | Status: DC

## 2023-06-29 NOTE — Telephone Encounter (Signed)
Error

## 2023-06-29 NOTE — Telephone Encounter (Signed)
Pt's medications were sent to pt's pharmacy as requested. Confirmation received.  

## 2023-06-29 NOTE — Telephone Encounter (Signed)
Please see below. Patient has appt on 2/11

## 2023-07-10 DIAGNOSIS — H6120 Impacted cerumen, unspecified ear: Secondary | ICD-10-CM | POA: Diagnosis not present

## 2023-07-17 DIAGNOSIS — H401133 Primary open-angle glaucoma, bilateral, severe stage: Secondary | ICD-10-CM | POA: Diagnosis not present

## 2023-07-18 DIAGNOSIS — L0100 Impetigo, unspecified: Secondary | ICD-10-CM | POA: Diagnosis not present

## 2023-07-18 DIAGNOSIS — C44319 Basal cell carcinoma of skin of other parts of face: Secondary | ICD-10-CM | POA: Diagnosis not present

## 2023-07-18 DIAGNOSIS — L570 Actinic keratosis: Secondary | ICD-10-CM | POA: Diagnosis not present

## 2023-07-18 DIAGNOSIS — X32XXXD Exposure to sunlight, subsequent encounter: Secondary | ICD-10-CM | POA: Diagnosis not present

## 2023-07-26 DIAGNOSIS — H02224 Mechanical lagophthalmos left upper eyelid: Secondary | ICD-10-CM | POA: Diagnosis not present

## 2023-07-26 DIAGNOSIS — H57813 Brow ptosis, bilateral: Secondary | ICD-10-CM | POA: Diagnosis not present

## 2023-07-26 DIAGNOSIS — H16212 Exposure keratoconjunctivitis, left eye: Secondary | ICD-10-CM | POA: Diagnosis not present

## 2023-07-26 DIAGNOSIS — H0279 Other degenerative disorders of eyelid and periocular area: Secondary | ICD-10-CM | POA: Diagnosis not present

## 2023-07-26 DIAGNOSIS — H02024 Mechanical entropion of left upper eyelid: Secondary | ICD-10-CM | POA: Diagnosis not present

## 2023-07-26 DIAGNOSIS — H02423 Myogenic ptosis of bilateral eyelids: Secondary | ICD-10-CM | POA: Diagnosis not present

## 2023-08-07 ENCOUNTER — Other Ambulatory Visit: Payer: Self-pay | Admitting: Cardiology

## 2023-08-22 DIAGNOSIS — D119 Benign neoplasm of major salivary gland, unspecified: Secondary | ICD-10-CM | POA: Diagnosis not present

## 2023-08-22 DIAGNOSIS — H6123 Impacted cerumen, bilateral: Secondary | ICD-10-CM | POA: Diagnosis not present

## 2023-08-22 DIAGNOSIS — H903 Sensorineural hearing loss, bilateral: Secondary | ICD-10-CM | POA: Diagnosis not present

## 2023-08-24 ENCOUNTER — Ambulatory Visit: Payer: Medicare HMO | Admitting: Internal Medicine

## 2023-08-24 DIAGNOSIS — J84112 Idiopathic pulmonary fibrosis: Secondary | ICD-10-CM | POA: Diagnosis not present

## 2023-08-24 LAB — PULMONARY FUNCTION TEST
DL/VA % pred: 94 %
DL/VA: 3.6 ml/min/mmHg/L
DLCO cor % pred: 74 %
DLCO cor: 20.13 ml/min/mmHg
DLCO unc % pred: 74 %
DLCO unc: 20.13 ml/min/mmHg
FEF 25-75 Pre: 3.03 L/s
FEF2575-%Pred-Pre: 134 %
FEV1-%Pred-Pre: 86 %
FEV1-Pre: 2.85 L
FEV1FVC-%Pred-Pre: 113 %
FEV6-%Pred-Pre: 81 %
FEV6-Pre: 3.52 L
FEV6FVC-%Pred-Pre: 106 %
FVC-%Pred-Pre: 76 %
FVC-Pre: 3.52 L
Pre FEV1/FVC ratio: 81 %
Pre FEV6/FVC Ratio: 100 %

## 2023-08-24 NOTE — Patient Instructions (Addendum)
ICD-10-CM   1. IPF (idiopathic pulmonary fibrosis) (HCC)  J84.112     2. Obesity (BMI 30.0-34.9)  E66.9     3. Anemia, unspecified type  D64.9     4. Stage 3a chronic kidney disease (HCC)  N18.31       IPF (idiopathic pulmonary fibrosis) (HCC)  - stable x 2 years but progressive from before that  - on supportive care  Plan  - continue supportive care per shared decision making -no clinical trials at this moment part of shared decision making - 6 months do spiro/dlco - can decide on CT chest at followup  Obesity (BMI 30.0-34.9)  - too bad ozempic was denied  Plan  - talk to PCP about weight loss drugs and see if lung and heart disease will get you approved  Anemia, unspecified type Stage 3a chronic kidney disease (HCC)  Plan  - per PCP Farris Has, MD   Followup - Do spirometry and DLCO in 6 months -Return to see Dr. Marchelle Gearing in 6 months  -Symptom score and walk test at follow-up

## 2023-08-24 NOTE — Progress Notes (Signed)
IOV 06/09/2020  Subjective:  Patient ID: Marcus [name], male , DOB: Oct 15, 1940 , age 82 y.o. , MRN: [id number] , ADDRESS: [address] [place] [address]  PCP Catha Gosselin, MD Cardiologist Dr. Carolanne Grumbling   06/09/2020 -   Chief Complaint  Patient presents with   Consult    sob for the last year, worse for the last 6 month.     HPI Marcus [name] 82 y.o. -82 year old male with a history of atherosclerotic coronary artery disease [status post remote PCI of LAD], hypertension and hyperlipidemia.  History of transaminitis that resolved after stopping ethanol and Tylenol.  Lower extremity Dopplers in 2017 30-49% left common femoral arterial stenosis.  He saw Dr. Mayford Knife cardiology in May 2021 with new onset of shortness of breath that is progressive.  Associated with exertional chest pressure.  At baseline he goes to [place] a lot and [place] 3 miles [place] the beach but as of May 2021 he was only able to walk 5 feet and had to stop halfway through that walk.  Also notices walking up stairs relieved by rest.  Overall dyspnea on exertion for 1 year.  Worse in the last 6 months but he also says that he some better in the last few months.  Denies any proximal nocturnal dyspnea orthopnea or lower extremity edema dizziness or palpitations or syncope.  Because of this he underwent nuclear medicine cardiac stress test February 14, 2020 that was low risk with ejection fraction 65%.  But he has had persistent symptoms and therefore has been referred here.  He then had pulmonary function test April 02, 2020 that shows mild restriction with FVC 3.82 L / 75% total lung capacity 5.76/71% but normal DLCO.  But because of persistent symptoms has been referred here.  Of note his echo showed grade 1 diastolic dysfunction.  Of note he also has a chronic cough during the same time.  -He tells me that recently his [facility] duct died out and after that the cough improved when the [facility] duct was off.  He is now  going to get his [facility] duct evaluated.    He also reports 10 pound weight gain during this time.  Normally is around 290 pounds.  Now he is at 300 pounds.  He is also been more sedentary because of the pandemic.  He says during this time his wife has noticed that he occasionally snores.  He does fall easily fatigued during the daytime.  He also has disrupted sleep pattern.  I did a stop bang questionnaire and it is 6 out of 8 suggesting significantly elevated risk for sleep apnea.  He has never had a sleep study.`  He is going back to [place], [place] where he owns another home.  He will not be able to do test in the next few weeks.   Results for Marcus, [name] (MRN [id number]) as of 06/09/2020 14:16  Ref. Range 03/23/2017 09:13  Please Note (HCV): Unknown Comment  Anti Nuclear Antibody (ANA) Latest Ref Range: Negative  Negative  ANCA Proteinase 3 Latest Ref Range: 0.0 - 3.5 U/mL <3.5  Atypical pANCA Latest Ref Range: Neg:<1:20 titer <1:20  ENA SSA (RO) Ab Latest Ref Range: 0.0 - 0.9 AI <0.2  ENA SSB (LA) Ab Latest Ref Range: 0.0 - 0.9 AI <0.2  Deamidated Gliadin Abs, IgG Latest Ref Range: 0 - 19 units 2  Myeloperoxidase Ab Latest Ref Range: 0.0 - 9.0 U/mL <9.0  RA Latex Turbid.  Latest Ref Range: 0.0 - 13.9 IU/mL <10.0  Transglutaminase IgA Latest Ref Range: 0 - 3 U/mL <2  Cytoplasmic (C-ANCA) Latest Ref Range: Neg:<1:20 titer <1:20  P-ANCA Latest Ref Range: Neg:<1:20 titer <1:20  Antigliadin Abs, IgA Latest Ref Range: 0 - 19 units 6  IgG (Immunoglobin G), Serum Latest Ref Range: 700 - 1,600 mg/dL 191  IgM (Immunoglobulin M), Srm Latest Ref Range: 15 - 143 mg/dL 478  IgA/Immunoglobulin A, Serum Latest Ref Range: 61 - 437 mg/dL 295  Lyme IgG/IgM Ab Latest Ref Range: 0.00 - 0.90 ISR <0.91    NP visit Nov 2021   07/23/2020  - Visit   82 year old male former smoker followed in our office for dyspnea on exertion and chronic cough.  He is established with Dr. Marchelle Gearing.  He was seen for an  initial consult in September/2021.  Plan of care from that consult with Dr. Marchelle Gearing was as follows: Obtain high-resolution CT chest, do simple walk test, CBC with differential, IgE.  Stop bang score 6.  Was referred to Dr. Mayford Knife for sleep study.  Patient presenting today as follow-up.  Patient reporting that he has been scheduled for home sleep study with cardiology on 08/11/2020.  Insurance would not pay for an in lab study.  Patient denies any sort of family history of lung disease.  He does report potential exposures when he was in the service for 2 years 1 of those years being in [place].  He reports a previous history of a primary care provider reporting that when he returned he may have had malaria and he was on malaria medications.  He is unsure of the validity of this.  Patient also worked in copper rod production for around 3 years.  He was around acid tanks for around 1 to 2 years during the rod washing.  He did not wear a mask.  He reports there is lots of smoke, copper dust as well as he to wear special close due to the exposures of the acid.  Patient is also currently dealing with an HVAC issue.  There is concerned there may be mold in this.  Both him and his wife have had respiratory symptoms of increased cough when the machine was running.  Is currently turned off and is being prepared to be cleaned.  He reports that the initial inspection by the HVAC team felt that there was no mold present.  Patient denies any persistent issues of acid reflux.  He does have a history of GERD and has a history of hiatal hernia.  He was treated with Nexium for around 6 to 8 weeks.  He reports he has been off this medication for many years.  He does not have breakthrough reflux symptoms or require Tums.  Patient continues to have aspects of shortness of breath occasionally.  Especially with physical exertion.  Patient also has an occasional cough.  Cough is more present in the morning when he is waking up.   Sometimes cough is productive.  He does have significant clear nasal drainage.  He is occasionally using a nasal medication that he believes is Flonase that his wife purchased over-the-counter.   OV 08/25/2020   Subjective:  Patient ID: Marcus [name], male , DOB: 1941-07-09, age 85 y.o. years. , MRN: [id number],  ADDRESS: [address] [place] [address] PCP  Catha Gosselin, MD Providers : Treatment Team:  Attending Provider: Kalman Shan, MD Patient Care Team: Catha Gosselin, MD as PCP - General (Family Medicine)  Quintella Reichert, MD as PCP - Cardiology (Cardiology)    Chief Complaint  Patient presents with   Follow-up    ILD, doing ok, occ cough with some mucous       HPI Marcus [name] 82 y.o. -returns for follow-up to discuss his test results.  He is here with his wife who I am meeting for the first time.  His high-resolution CT chest is interpreted as indeterminate for UIP.  I personally visualized this I personally thought it was probable UIP.  I took a second opinion from Dr. Roney Jaffe thoracic radiologist who feels that patient could get classified as probable UIP on the CT scan.  Long discussion was held with the patient but he did not bring his ILD questionnaire.  He had to bring this after the visit.  Therefore parts of this dictation happened after his visit but on the same day.  His main concern is that he will die prematurely.  [facility] Comprehensive ILD Questionnaire  Symptoms: He tells me that he had insidious onset of shortness of breath for the last 12 months.  Since it started it is the same.  He has level 1 dyspnea for household work, shopping and walking at a level pace.  Level 2 dyspnea on walking up stairs.  He does have difficulty keeping up with others of his age.  He has neuropathy and hip soreness.  He also has a cough since March 2021.  It is the same since it started moderate in severity.  It is dry.  He does clear his throat and he  does feel a tickle in his throat.  There is no hemoptysis or sputum production.  No wheezing no nausea no vomiting no diarrhea.  The cough is mostly in the morning.  His symptoms do feel better when he stops activity for 5 minutes.  It makes him feel more short of breath when he does vigorous activity.  No associated symptoms.   Past Medical History : He has a history of coronary artery disease with 1 stent. He is undergoing a sleep apnea work-up.  He has chronic kidney disease stage III according to his history. Malaria in [place]. Polio as a kid   He does not have rheumatoid arthritis.  No scleroderma no lupus no polymyositis no Sjogren's.  No other vasculitis.  No thyroid disease no diabetes.  No stroke no seizures.  No hepatitis no pneumonia.  His creatinine in May 2021 was 1.58 mg percent with a GFR of 42-48.  In October 2019 his creatinine was 1.35 mg percent.   Of note he says his dental hygienist has noted persistent redness in his hard palate.  He is asking me to refer him to ENT specialist and I have obliged  ROS: He does have arthralgia related to gout.  He has seborrhea.  He denies any oral ulcers or heartburn or vomiting or nausea.  Denies any weight loss.  He has obesity.  No recurrent fever no dryness.  No fatigue   FAMILY HISTORY of LUNG DISEASE: No pulmonary fibrosis no COPD.  His sister has autoimmune disease Brother has asthma   EXPOSURE HISTORY: He smokes cigarettes between 1960 and 1966 2 packs.  He also smoked cigars for 20 years.  No passive smoking.  No electronic cigarettes no marijuana no vaping no cocaine no intravenous drug use. Was      HOME and HOBBY DETAILS : Single-family home [place] for the last 10 years.  Age of the home is 25 years.  It is a 5 bedroom house.  There is no dampness.  No mold or mildew no humidifier use no CPAP use no nebulizer use.  No steam iron use no Jacuzzi use no misting Fountain.  No pet birds or parakeets no pet  gerbils or hamsters.  No feather pillows no mold in the [facility] duct.  No music habits.  No gardening habits.  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  During this time he was exposed to gas fumes and chemicals he reports that sulfuric acid copper out washing.  Otherwise negative for organic and inorganic antigens.   PULMONARY TOXICITY HISTORY (27 items): Negative     Radiology below were read by Dr. Leanna Battles is indeterminate for UIP.  My personal opinion this is probable UIP.  Dr. Roney Jaffe radiologist can also concur with me it is probable UIP   Narrative & Impression  CLINICAL DATA:  Shortness of breath. Productive cough and shortness of breath exertion for approximately 1 year.   EXAM: CT CHEST WITHOUT CONTRAST   TECHNIQUE: Multidetector CT imaging of the chest was performed following the standard protocol without intravenous contrast. High resolution imaging of the lungs, as well as inspiratory and expiratory imaging, was performed.   COMPARISON:  None.   FINDINGS: Cardiovascular: Atherosclerotic calcification of the aorta, aortic valve and coronary arteries. Pulmonic trunk is enlarged. Heart is at the upper limits of normal in size to mildly enlarged. No pericardial effusion.   Mediastinum/Nodes: No pathologically enlarged mediastinal or axillary lymph nodes. Hilar regions are difficult to evaluate without IV contrast. Esophagus is unremarkable.   Lungs/Pleura: Mild basilar predominant subpleural reticulation, ground-glass and traction bronchiectasis/bronchiolectasis. Findings persist on prone imaging. No definitive honeycombing. No air trapping. 3 mm left upper lobe nodule (3/57). 4 mm peripheral left lower lobe nodule (3/106). Mild centrilobular emphysema. No pleural fluid. Airway is unremarkable.   Upper Abdomen: Visualized portions of the liver,  gallbladder, adrenal glands, kidneys, spleen, pancreas, stomach and bowel are grossly unremarkable. No upper abdominal adenopathy.   Musculoskeletal: Degenerative changes in the spine.   IMPRESSION: 1. Mild basilar predominant subpleural fibrosis may be due to nonspecific interstitial pneumonitis or usual interstitial pneumonitis. Findings are indeterminate for UIP per consensus guidelines: Diagnosis of Idiopathic Pulmonary Fibrosis: An Official ATS/ERS/JRS/ALAT Clinical Practice Guideline. Am Rosezetta Schlatter Crit Care Med Vol 198, Iss 5, [phone], May 13 2017. 2. Pulmonary nodules measure up to 4 mm. No follow-up needed if patient is low-risk (and has no known or suspected primary neoplasm). Non-contrast chest CT can be considered in 12 months if patient is high-risk. This recommendation follows the consensus statement: Guidelines for Management of Incidental Pulmonary Nodules Detected on CT Images: From the Fleischner Society 2017; Radiology 2017; 284:228-243. 3. Aortic atherosclerosis (ICD10-I70.0). Coronary artery calcification. 4. Enlarged pulmonic trunk, indicative of pulmonary arterial hypertension. 5.  Emphysema (ICD10-J43.9).     Electronically Signed   By: Leanna Battles M.D.   On: 07/15/2020 14:33    Results for GERSHOM, VENIER (MRN [id number]) as of 08/25/2020 10:28  Ref. Range 07/23/2020 09:56 07/23/2020 09:58  Anti Nuclear Antibody (ANA) Latest Ref Range: NEGATIVE  POSITIVE (A)   ANA Pattern 1 Unknown Nuclear, Homogeneous (A)   ANA Titer 1 Latest Units: titer 1:40 (H)   Cyclic Citrullin Peptide Ab Latest Units: UNITS <16  RA Latex Turbid. Latest Ref Range: <14 IU/mL <14   ANA,IFA RA DIAG PNL W/RFLX TIT/PATN Unknown Rpt (A)   Anti-Jo-1 Ab (RDL) Latest Ref Range: <20 Units  <20  Anti-PL-7 Ab (RDL) Latest Ref Range: Negative   Negative  Anti-PL-12 Ab (RDL) Latest Ref Range: Negative   Negative  Anti-EJ Ab (RDL) Latest Ref Range: Negative   Negative  Anti-OJ Ab (RDL)  Latest Ref Range: Negative   Negative  Anti-SRP Ab (RDL) Latest Ref Range: Negative   Negative  Anti-Mi-2 Ab (RDL) Latest Ref Range: Negative   Negative  Anti-TIF-1gamma Ab (RDL) Latest Ref Range: <20 Units  <20  Anti-MDA-5 Ab (CADM-140)(RDL) Latest Ref Range: <20 Units  <20  Anti-NXP-2 (P140) Ab (RDL) Latest Ref Range: <20 Units  <20  Anti-SAE1 Ab, IgG (RDL) Latest Ref Range: <20 Units  <20  Anti-PM/Scl-100 Ab (RDL) Latest Ref Range: <20 Units  <20  Anti-Ku Ab (RDL) Latest Ref Range: Negative   Negative  Anti-SS-A 52kD Ab, IgG (RDL) Latest Ref Range: <20 Units  <20  Anti-U1 RNP Ab (RDL) Latest Ref Range: <20 Units  <20  Anti-U2 RNP Ab (RDL) Latest Ref Range: Negative   Negative  Anti-U3 RNP (Fibrillarin)(RDL) Latest Ref Range: Negative   Negative  SSA (Ro) (ENA) Antibody, IgG Latest Ref Range: <1.0 NEG AI <1.0 NEG   SSB (La) (ENA) Antibody, IgG Latest Ref Range: <1.0 NEG AI <1.0 NEG   Scleroderma (Scl-70) (ENA) Antibody, IgG Latest Ref Range: <1.0 NEG AI <1.0 NEG     OV 10/28/2020  Subjective:  Patient ID: Marcus [name], male , DOB: [birth year] , age 43 y.o. , MRN: [id number] , ADDRESS: [address] [place] [address] PCP Catha Gosselin, MD Patient Care Team: Catha Gosselin, MD as PCP - General (Family Medicine) Quintella Reichert, MD as PCP - Cardiology (Cardiology)  This Provider for this visit: Treatment Team:  Attending Provider: Kalman Shan, MD    10/28/2020 -   Chief Complaint  Patient presents with   Follow-up    Get pft results.  Gets bloody nose when he blows his nose, small amount of blood, just since starting Esbriet.    IPF dx 08/25/2020: High PRob  clinical suspicion is you have IPF based on age greater than 87, male gender, Caucasian ethnicity, negative serology profile, history of acid reflux, previous exposure to smoke and copper and prior history of smoking and prob UIP on CT.  Start esbriet Januaryc 08, 2022 HPI Marcus [name] 82 y.o. -follow-up  idiopathic pulmonary fibrosis new diagnosis December 2021. We started him on pirfenidone January 2022. He says he is tolerating it really well. His symptom score is stable. In fact he feels that pirfenidone is helping him. He feels his lungs have opened up. He feels that the antifibrotic actions have kicked in. He does admit this could be psychological. He did complain of some bloody nose when he blows up. He wondered if it is because of pirfenidone. Explained to him pirfenidone does not have any antiplatelet agents. Explained that likely this is due to the cold winter and dry air. He shuttles between [place] and [place]. Did explain to him that if he were to relocate to [place] I would set him up with excellent ILD specialist at [facility]. We discussed patient support group. He seems interested. We discussed pulmonary rehabilitation but he has back and spine issues and knee issues that make it difficult. He wants to reflect on this. We discussed clinical trials as a care  option including specifically all the details below. He wants to reflect on this but he feels that shuttling between [place] in [place] can be difficult to do clinical trials. However once is stable on the drug and sometimes past he might be able to do it.  There are no other new issues. Last visit I did indicate to him to consider stopping his omega-3 because of concern of omega-3 making acid reflux worse which is a risk factor for IPF. However he says it has helped his triglycerides. He wants to continue on this. His acid reflux is not super active. I have supported him in this decision.     PFT    OV 12/08/2020  Subjective:  Patient ID: Marcus [name], male , DOB: November 07, 1940 , age 37 y.o. , MRN: [id number] , ADDRESS: [address] [place] [address] PCP Catha Gosselin, MD Patient Care Team: Catha Gosselin, MD as PCP - General (Family Medicine) Quintella Reichert, MD as PCP - Cardiology (Cardiology)  This  Provider for this visit: Treatment Team:  Attending Provider: Kalman Shan, MD   IPF dx 08/25/2020 (reconfirmed MDD 11/17/20) : High PRob  clinical suspicion is you have IPF based on age greater than 78, male gender, Caucasian ethnicity, negative serology profile, history of acid reflux, previous exposure to smoke and copper and prior history of smoking and prob UIP on CT.   Start esbriet [employer] 08, 2022   12/08/2020 -   Chief Complaint  Patient presents with   Follow-up    Pt states he is doing okay since last visit and states breathing is about the same.     HPI Marcus [name] 82 y.o. -+ presents for follow-up.  Last visit was 1 month ago.  This visit was supposed to be a televisit but he showed up at the front door.  Therefore he preferred to do a face-to-face visit.  We discussed in the clinical conference and confirmation is that he is IPF.  He says he is tolerating pirfenidone well.  He is going to start his third month of pirfenidone in the first week of April 2022.  He says occasionally bites his tongue at night.  This is associated with starting losartan 6 months ago but is not on ACE inhibitor.  He will discuss this with the cardiologist.  Also says that tongue biting started after the pirfenidone but it is not a known side effect of pirfenidone.  Overall he is feeling stable.  He is not interested in clinical trials of pulmonary rehabilitation at this point.  He shuttling between [place] in [place].  His symptom scores are listed below.    Telel visit 01/19/21  82 yo former smoker followed for IPF Medical history significant for coronary artery disease, hypertension and hyperlipidemia.  Today's telemedicine visit is for a 6-week follow-up.  Patient has presumed IPF with interstitial changes consistent with UIP on high-resolution CT chest.  Patient was started on Esbriet in January 2022.  Patient says he is tolerating well.  He has had no nausea vomiting or diarrhea.   Appetite is good no weight loss.  Patient says he still gets short of breath with activities but tries to remain active.  He has minimum cough. Patient aware that he needs to return for lab work today.  Patient denies any flare of cough or shortness of breath.  No change in his activity level.    OV 05/28/2021  Subjective:  Patient ID: Marcus [name], male , DOB: [birth year] , age  63 y.o. , MRN: [id number] , ADDRESS: [address] [place] [address] PCP Catha Gosselin, MD Patient Care Team: Catha Gosselin, MD as PCP - General (Family Medicine) Quintella Reichert, MD as PCP - Cardiology (Cardiology)  This Provider for this visit: Treatment Team:  Attending Provider: Kalman Shan, MD    05/28/2021 -   Chief Complaint  Patient presents with   Follow-up    Pt states he has been doing okay since last visit. States that he stopped taking Esbriet about a month ago. Pt states that his breathing is about the same.    IPF dx 08/25/2020 (reconfirmed MDD 11/17/20) : High PRob  clinical suspicion is you have IPF based on age greater than 32, male gender, Caucasian ethnicity, negative serology profile, history of acid reflux, previous exposure to smoke and copper and prior history of smoking and prob UIP on CT.   Start esbriet Januaryc 08, 2022 - stopped summer 2022 afte sun rash x 2 times   Starting ofev  HPI Marcus [name] 82 y.o. -       OV 08/18/2021  Subjective:  Patient ID: Marcus [name], male , DOB: 1941/02/23 , age 60 y.o. , MRN: [id number] , ADDRESS: [address] [place] [address] PCP Catha Gosselin, MD Patient Care Team: Catha Gosselin, MD as PCP - General (Family Medicine) Quintella Reichert, MD as PCP - Cardiology (Cardiology)  This Provider for this visit: Treatment Team:  Attending Provider: Kalman Shan, MD    08/18/2021 -   Chief Complaint  Patient presents with   Follow-up    F/U after starting Ofev. States his BP has increased since  starting Ofev on November 1st. Has had occasional episodes of diarrhea.     IPF dx 08/25/2020 (reconfirmed MDD 11/17/20) : High PRob  clinical suspicion is you have IPF based on age greater than 78, male gender, Caucasian ethnicity, negative serology profile, history of acid reflux, previous exposure to smoke and copper and prior history of smoking and prob UIP on CT.   Start esbriet Januaryc 08, 2022 - stopped summer 2022 afte sun rash x 2 times   Starting ofev  HPI [employer] 82 y.o. -returns for follow-up.  In the interim because he failed pirfenidone we switched him to nintedanib.  He is tolerating it well except for some mild diarrhea but 2 weeks after starting this his blood pressure was high the cardiology visit.  I reviewed the notes.  He started nintedanib around late October 2022/early November 2022 and by mid November there was increase in blood pressure [the third patient I am seeing with this issue this year].  Cardiologist Dr. Mayford Knife has added amlodipine 5 mg/day to the regimen with is not controlling the blood pressure.  I did advise him that he could contact and increase it to 10 mg/day with a side effect being edema.  He is going to look into this.  His respiratory status is stable.  He had liver function test monitoring and he has a new increase in ALT.  He is on allopurinol and Lipitor which can also increase his liver enzymes but he has been on this for a long time.  Only new intervention is nintedanib.  Very likely and nintedanib is increasing the liver enzymes but is only a mild enzyme increase in 1 enzyme which is ALT.  He is agreed for close monitoring.  I did not ask him about his alcohol intake.  He has not had PFTs.  We  are holding off on PFTs because of his recent eye surgery which has gone well but the recovery is slow but improving.   He had high-resolution CT scan of the chest recently in November 2022.  Compared to 1 year ago there is no progression.  There are no  other new findings.  And CT scan of the chest in 1 year.  Recent Labs  Lab 08/17/21 0916  AST 35  ALT 52*  ALKPHOS 60  BILITOT 0.5  PROT 6.5  ALBUMIN 4.3      CT Chest data 07/27/21  CLINICAL DATA:  82 year old male with history of interstitial lung disease. Follow-up study.   EXAM: CT CHEST WITHOUT CONTRAST   TECHNIQUE: Multidetector CT imaging of the chest was performed following the standard protocol without intravenous contrast. High resolution imaging of the lungs, as well as inspiratory and expiratory imaging, was performed.   COMPARISON:  Chest CT 07/15/2020.   FINDINGS: Cardiovascular: Heart size is normal. There is no significant pericardial fluid, thickening or pericardial calcification. There is aortic atherosclerosis, as well as atherosclerosis of the great vessels of the mediastinum and the coronary arteries, including calcified atherosclerotic plaque in the left main, left anterior descending, left circumflex and right coronary arteries.   Mediastinum/Nodes: No pathologically enlarged mediastinal or hilar lymph nodes. Please note that accurate exclusion of hilar adenopathy is limited on noncontrast CT scans. Esophagus is unremarkable in appearance. No axillary lymphadenopathy.   Lungs/Pleura: High-resolution images demonstrate areas of ground-glass attenuation, septal thickening, mild subpleural reticulation, mild cylindrical bronchiectasis, thickening of the peribronchovascular interstitium and regional architectural distortion and volume loss. These findings are most evident in the lung bases, particularly in the lower lobes. Bronchiectasis is now more apparent in these regions, but no other significant progression of disease when compared to the prior study. No honeycombing. Inspiratory and expiratory imaging is unremarkable.   Upper Abdomen: Aortic atherosclerosis.   Musculoskeletal: There are no aggressive appearing lytic or blastic lesions  noted in the visualized portions of the skeleton.   IMPRESSION: 1. The appearance of the lungs remains compatible with interstitial lung disease, with a spectrum of findings categorized as probable usual interstitial pneumonia (UIP) per current ATS guidelines. No significant progression of disease compared to the prior study. 2. Aortic atherosclerosis, in addition to left main and 3 vessel coronary artery disease. Please note that although the presence of coronary artery calcium documents the presence of coronary artery disease, the severity of this disease and any potential stenosis cannot be assessed on this non-gated CT examination. Assessment for potential risk factor modification, dietary therapy or pharmacologic therapy may be warranted, if clinically indicated.   Aortic Atherosclerosis (ICD10-I70.0).     Electronically Signed   By: Trudie Reed M.D.   On: 07/28/2021 10:58    No results found.    09/10/2021 Patient was contacted today for virtual follow-up/ Clinically stable pulmonary fibrosis. He has been on OFEV for 2 months. He was last seen by MR on 08/18/21. He had some mild diarrhea arou. BP elevated with cardiology, Dr. Mayford Knife added Amlodipine 5mg  daily. Patient reports that his blood pressure reading this morning was 140/62 compared to previous readings in 150-160/70s. He is still having diarrhea.  He will go 2-3 days without any GI symptoms. When diarrhea is severe he will take imodium which does help. On average he takes 6 imodium tablets a week. ALT on CMET was elevated on 08/17/21. He has been on Allopurional and lipitor for a long time. He did  a repeat lab test which showed LFTs which were back to normal. He need another hepatic functoin panel in 1 week. He consumes 2-3 glasses of wine a day and 2-3 pints of beer a week.       OV 10/14/2021  Subjective:  Patient ID: Marcus [name], male , DOB: [birth year] , age 85 y.o. , MRN: [id number] , ADDRESS: [address] [place] [address] PCP Catha Gosselin, MD Patient Care Team: Catha Gosselin, MD as PCP - General (Family Medicine) Quintella Reichert, MD as PCP - Cardiology (Cardiology)  This Provider for this visit: Treatment Team:  Attending Provider: Kalman Shan, MD    10/14/2021 -   IPF follow-up on nintedanib.  Previous intolerance to pirfenidone. Chief Complaint  Patient presents with   Follow-up    Pt states he is about the same since last visit.     HPI Marcus [name] 82 y.o. -returns for follow-up.  He has intermittent mild transaminitis while on nintedanib.  This is no attributable to alcohol and also Lipitor.  Most recent liver function test is normal.  However he is having significant diarrhea.  He uses 6-7 Imodium each week.  He wants to stop nintedanib and give it a holiday for a few weeks and then reassess.  He did admit to drinking 2 cups of coffee a day and also 1 soda a week.  Beyond this he denies any other increased sugar intake or carbohydrate intake.  He does have high triglycerides and is on fish oil.  I recommended he stop fish oil and avoid coffee to help with his diarrhea.  He says he will stop the fish oil at least temporarily although is worried about the impact on his triglycerides.  I did indicate to him the best treatment for hypertriglyceridemia is low carbohydrate food.  In any event he wants to give nintedanib holiday.  We agreed to do a 2-week holiday.  He from a respiratory standpoint is stable.  His walking desaturation test is stable.  We are holding off on pulmonary function test at the moment because of recent cataract surgery.  He does have significant cough and is on Mucinex.  He says he has had 4 shots of COVID-vaccine.  Is wondering whether he should get another booster.  I indicated to him the current active strain is from [place] the BX strain.  There is no mRNA vaccine for this.  Therefore we took a shared decision making to hold off.  However if he got  COVID we will do antiviral.    .    OV 01/03/2022  Subjective:  Patient ID: Marcus [name], male , DOB: 11-Dec-1940 , age 66 y.o. , MRN: [id number] , ADDRESS: [address] [place] [address] PCP Catha Gosselin, MD Patient Care Team: Catha Gosselin, MD as PCP - General (Family Medicine) Quintella Reichert, MD as PCP - Cardiology (Cardiology)  This Provider for this visit: Treatment Team:  Attending Provider: Kalman Shan, MD    01/03/2022 -   Chief Complaint  Patient presents with   Follow-up    PFT performed today.  Pt states he is about the same since last visit. Pt stopped taking OFEV about 1 month ago due to having side effect of diarrhea.    HPI Marcus [name] 82 y.o. -returns for follow-up.  He tells me that he could not tolerate nintedanib because of all the side effects.  He says he stopped taking nintedanib over a month ago and his  quality of life is much better.  At this point in time he just wants to do expectant follow-up.  We discussed clinical trials as a care option but he finds it too restrictive on his lifestyle.  We discussed protocol with inhaled treprostinil is a phase 3 study for IPF.  It is less intense than the other studies.  However he does not like the idea that he has to do a nebulizer 4 times daily and each time at least 9-12 times puffs.  However he took the consent form for this.  He is currently taking some kind of a gummy that increases metabolism and he says it makes him feel better.  He wants to lose weight.  We discussed Ozempic.  He says he will bring this up with his primary care physician.       OV 06/14/2022  Subjective:  Patient ID: Marcus [name], male , DOB: 02/12/1941 , age 22 y.o. , MRN: [id number] , ADDRESS: [address] [place] [address] PCP Catha Gosselin, MD Patient Care Team: Catha Gosselin, MD as PCP - General (Family Medicine) Quintella Reichert, MD as PCP - Cardiology (Cardiology)  This Provider for this  visit: Treatment Team:  Attending Provider: Kalman Shan, MD  06/14/2022 -   Chief Complaint  Patient presents with   Follow-up    Spiro/DLCO done today. Breathing is unchanged since the last visit. He tends to have a cough in the morning- non prod.      HPI Marcus [name] 82 y.o. -IVF follow-up.  He is on supportive care.  He presents with his wife who I am meeting for the first time.  At this point in time he feels his dyspnea is stable.  He says he has been dealing with other health issues that include obesity.  He has been on a keto diet and lost 35 pounds.  He has not entertained some of the newer medications.  He also deals with significant back pain and is using a cane.  He is also had facial swelling and has had a CT scan and apparently there is a dental abscess.  He is also needing tarsorrhaphy along with suffering from glaucoma.  This eye surgery was postponed because his wife had COVID.  Is not exposed to his wife with COVID.  His wife had COVID while she was traveling to [place] but the eye doctors misunderstood this and postponed his eye surgery.  At this point in time he is not interested in clinical trial.  Previously intolerant to approved antifibrotic's.  In the future he could be interested in clinical trials.  He wanted a handicap placard which we did for him.  CT Chest data   OV 09/20/2022  Subjective:  Patient ID: Marcus [name], male , DOB: Aug 22, 1941 , age 67 y.o. , MRN: [id number] , ADDRESS: [address] [place] [address] PCP Farris Has, MD Patient Care Team: Farris Has, MD as PCP - General (Family Medicine) Quintella Reichert, MD as PCP - Cardiology (Cardiology)  This Provider for this visit: Treatment Team:  Attending Provider: Kalman Shan, MD     09/20/2022 -   Chief Complaint  Patient presents with   Follow-up    Cough persistent.  Worse in mornings     HPI Marcus [name] 82 y.o. -returns for follow-up.  Presents with his  wife.  History is given by him but also review of the records external and also by the wife who is being an independent  historian.  After his last visit in December 2023 he was admitted with parotid abscess on the left side.  I reviewed the note.  He did have some acute kidney injury and this went back to baseline.  He has since seen primary care doctor.  Also some mild anemia.  In terms of his shortness of breath he feels he is stable.  Symptom scores are stable.  Walking desaturation test he stopped a little bit more prematurely because he was tired but there is no chest pain or worsening shortness of breath.  He had pulmonary function test that shows his FVC is declined on 8% but his DLCO is stable.  He still has only mild disease.  We again discussed approach to IPF.  He again prefers to have supportive care and expectant approach with serial monitoring.  He is not interested in rechallenge with antifibrotic's or trying an interventional clinical trial at this time  We discussed the risk of osteoporosis in older men and also potential that the eye PF is an independent risk factor for osteoporosis.  [employer] is offering patient's a DEXA bone scan as part of the recent study.  He is interested in this.  Wife wanted to review weight loss options.  In particular he feels the weighing scale today is out of calibration.  He says he gave him the wrong weight of 280 pounds.  Recently weighed 10 pounds lighter at the primary care and his personal home.  He is no longer doing the keto diet.  He is trying to visit with a nutritionist.  We did discuss the new weight loss drugs.  Asked him to talk about this with the primary care doctor.    OV 08/25/2023  Subjective:  Patient ID: Marcus [name], male , DOB: [birth year] , age 82 y.o. , MRN: [id number] , ADDRESS: [address] [place] [address] PCP Farris Has, MD Patient Care Team: Farris Has, MD as PCP - General (Family Medicine) Quintella Reichert, MD as PCP - Cardiology (Cardiology)  This Provider for this visit: Treatment Team:  Attending Provider: Kalman Shan, MD  IPF dx 08/25/2020 (reconfirmed MDD 11/17/20) : High PRob  clinical suspicion is you have IPF based on age greater than 3, male gender, Caucasian ethnicity, negative serology profile, history of acid reflux, previous exposure to smoke and copper and prior history of smoking and prob UIP on CT.   -On October 2023 visit the wife recollected mold in the house was in [place] in the 1990s.  There is also a mold recently and he got rid of it.   IPF follow-up - Previous intolerance to pirfenidone. - Quit Ofev March 2023 - declined participation in cough study for IPF March 2023 - On expectant approach since sprim/summer 2023   - last CT Nov 2022 and then December 2023  08/25/2023 -   Chief Complaint  Patient presents with   Follow-up    Breathing is stable. He has some cough with ? Color sputum in the am.      HPI Marcus [name] 82 y.o. -returns for follow-up.  Last seen in January 2024.  Since the last 1 and half years he is been on supportive care.  He is declined to do antifibrotic's because of side effect profile.  He says he continues to feel stable.  See symptom score below.  His major problem appears to be his obesity.  He tried to go on Ozempic but apparently it was denied by insurance.  I encouraged him to talk to primary care physician and use justification of pulmonary fibrosis and also heart disease.  He is going to try to do that.  Otherwise doing well.  He again prefers to be on supportive care particularly because he is stable.  His pulmonary function test also shows stability for the last year or 2.  He is not interested in clinical trials as a care option because he is feeling well.  Last CT chest was a year ago.  He is now over 15 years of age.  Social issues - His wife went to [place] and got COVID.  He says many members of his family of  gotten COVID but he is here to get COVID     SYMPTOM SCALE -  06/09/2020  08/25/2020  10/28/2020  12/08/2020  08/18/2021  10/14/2021  01/03/2022  06/14/2022  09/20/2022  08/25/2023 Supportive care  O2 use ra ra ra ra ra a ra ra ra   Shortness of Breath 0 -> 5 scale with 5 being worst (score 6 If unable to do)    0       At rest 0 0 0 0 1.5 0 0 0 0 0  Simple tasks - showers, clothes change, eating, shaving 1 0 1 2 2 2 2 2 2 2   Household (dishes, doing bed, laundry) 2 1 2 3 1 3 3 3 1 1   Shopping 2 2 1 1  1. 2 2 3 1 1   Walking level at own pace 3 2 1 2  1.5 2 1 3 2 2   Walking up Stairs 3 2 2 2 2 3 1 3 1 2   Total (30-36) Dyspnea Score 11 7 7 10 9 12 9 14 7 8   How bad is your cough? 2 4 2 2 3 3 1 1 3 1   How bad is your fatigue 4 3 2 2 2 2 2 2 2 1   How bad is nausea 0 0 0 0 0 0 0 0 0 0   How bad is vomiting?  00 0 0 0 0 0 0 0 0 0   How bad is diarrhea? 0 0 0 0 2 3 0 0 0 0   How bad is anxiety? 0 0 0 0 0 0 0 0 0 0   How bad is depression 0 0 0 0 0 0 0 1 0 0        Simple office walk 185 feet x  3 laps goal with forehead probe 06/09/2020  08/25/2020  10/28/2020  05/28/2021  10/14/2021  09/20/2022  08/25/2023   O2 used ra  ra ra ra ra ra  Number laps completed 3  3 3 3  Did only 2 of 3 and got tired Sit stand x 10  Comments about pace nl 100% and 64/min moderate  avg    Resting Pulse Ox/HR 100% and 61/min  100% and 63 100% and 66 100% and 66 99% and HR 60 100% and HR 59  Final Pulse Ox/HR 97% and 98/min 98% and 80/min 99% and 100 98% and 1112 99% and 107 98% and HR 88 98% and HR 80  Desaturated </= 88% no        Desaturated <= 3% points yes        Got Tachycardic >/= 90/min yes        Symptoms at end of test x  Mod desypnea  Mild dyspnea    Miscellaneous comments x  Dyspnea started lap 1.NEver stopped  PFT     Latest Ref Rng & Units 08/24/2023    1:56 PM 09/20/2022    8:56 AM 06/14/2022    9:49 AM 01/03/2022    2:49 PM 10/28/2020    9:00 AM 04/02/2020    9:46 AM  ILD indicators   FVC-Pre L 3.52  P 3.46  3.78  3.67  3.69  3.82   FVC-Predicted Pre % 76  P 74  81  78  74  75   FVC-Post L     3.68  3.85   FVC-Predicted Post %     73  76   TLC L     5.72  5.76   TLC Predicted %     70  71   DLCO uncorrected ml/min/mmHg 20.13  P 21.97  19.97  20.48  21.84  23.34   DLCO UNC %Pred % 74  P 81  73  75  76  81   DLCO Corrected ml/min/mmHg 20.13  P 24.82  19.97  20.48  21.84  23.34   DLCO COR %Pred % 74  P 91  73  75  76  81     P Preliminary result      LAB RESULTS last 96 hours No results found.  LAB RESULTS last 90 days Recent Results (from the past 2160 hours)  Pulmonary function test     Status: None (Preliminary result)   Collection Time: 08/24/23  1:56 PM  Result Value Ref Range   FVC-Pre 3.52 L   FVC-%Pred-Pre 76 %   FEV1-Pre 2.85 L   FEV1-%Pred-Pre 86 %   FEV6-Pre 3.52 L   FEV6-%Pred-Pre 81 %   Pre FEV1/FVC ratio 81 %   FEV1FVC-%Pred-Pre 113 %   Pre FEV6/FVC Ratio 100 %   FEV6FVC-%Pred-Pre 106 %   FEF 25-75 Pre 3.03 L/sec   FEF2575-%Pred-Pre 134 %   DLCO unc 20.13 ml/min/mmHg   DLCO unc % pred 74 %   DLCO cor 20.13 ml/min/mmHg   DLCO cor % pred 74 %   DL/VA 4.09 ml/min/mmHg/L   DL/VA % pred 94 %         has a past medical history of Ascending aorta dilatation (HCC), Benign essential HTN (09/09/2013), Chronic kidney disease, Coronary artery disease (08/2013), Dyslipidemia, Elevated fasting glucose, Fatty liver, Glaucoma, Gout, Hyperlipidemia, Hypertension, Joint pain, LAD (lymphadenopathy), Obesity, Pure hypercholesterolemia (09/09/2013), PVD (peripheral vascular disease) (HCC) (06/12/2018), and Vision abnormalities.   reports that he quit smoking about 3 years ago. His smoking use included cigars. He has never used smokeless tobacco.  Past Surgical History:  Procedure Laterality Date   ADENOIDECTOMY     CATARACT EXTRACTION     CORONARY ANGIOPLASTY WITH STENT PLACEMENT  2009   EYE SURGERY     TONSILLECTOMY     WISDOM TOOTH EXTRACTION       Allergies  Allergen Reactions   Pirfenidone Rash   Ofev [Nintedanib] Diarrhea    Immunization History  Administered Date(s) Administered   Fluad Quad(high Dose 65+) 05/13/2020   Influenza, High Dose Seasonal PF 06/06/2019, 08/13/2021, 05/14/2023   Influenza-Unspecified 06/08/2020   PFIZER(Purple Top)SARS-COV-2 Vaccination 09/28/2019, 10/19/2019, 06/08/2020, 02/05/2021   Pneumococcal Conjugate-13 10/23/2014   Pneumococcal Polysaccharide-23 08/27/2012   Tdap 08/27/2012   Zoster, Live 08/29/2012, 09/28/2019, 03/11/2020    Family History  Problem Relation Age of Onset   Breast cancer Mother    Heart attack Father    Rheumatic fever Father    CAD Sister    Multiple  sclerosis Sister    CAD Brother    Neuropathy Neg Hx      Current Outpatient Medications:    allopurinol (ZYLOPRIM) 300 MG tablet, Take 300 mg by mouth daily., Disp: , Rfl:    amLODipine (NORVASC) 10 MG tablet, Take 1 tablet (10 mg total) by mouth daily., Disp: 90 tablet, Rfl: 1   aspirin 81 MG tablet, Take 81 mg by mouth daily., Disp: , Rfl:    atorvastatin (LIPITOR) 80 MG tablet, Take 1 tablet (80 mg total) by mouth daily., Disp: 90 tablet, Rfl: 1   cholecalciferol (VITAMIN D3) 25 MCG (1000 UNIT) tablet, Take 1,000 Units by mouth daily., Disp: , Rfl:    Cyanocobalamin (VITAMIN B-12 PO), Take 1 capsule by mouth daily., Disp: , Rfl:    Dextromethorphan-guaiFENesin (MUCINEX DM PO), Take 1 tablet by mouth daily., Disp: , Rfl:    dorzolamide-timolol (COSOPT) 22.3-6.8 MG/ML ophthalmic solution, Place 1 drop into the left eye 2 (two) times daily., Disp: , Rfl:    ferrous sulfate 325 (65 FE) MG EC tablet, Take 325 mg by mouth 3 (three) times daily with meals., Disp: , Rfl:    metoprolol succinate (TOPROL-XL) 100 MG 24 hr tablet, Take with or immediately following a meal., Disp: 90 tablet, Rfl: 1   Netarsudil-Latanoprost (ROCKLATAN) 0.02-0.005 % SOLN, As directed in left eye, Disp: , Rfl:    nitroGLYCERIN (NITROSTAT)  0.4 MG SL tablet, Place 0.4 mg under the tongue every 5 (five) minutes as needed for chest pain., Disp: , Rfl:    Omega-3 Fatty Acids (OMEGA 3 PO), Take 2 capsules by mouth in the morning and at bedtime. 2000 mg BID, Disp: , Rfl:    pimecrolimus (ELIDEL) 1 % cream, Apply 1 Application topically 2 (two) times daily. To affected area on the face, Disp: , Rfl:    tamsulosin (FLOMAX) 0.4 MG CAPS capsule, Take 0.4 mg by mouth daily., Disp: , Rfl:  No current facility-administered medications for this visit.  Facility-Administered Medications Ordered in Other Visits:    technetium tetrofosmin (TC-MYOVIEW) injection 30.3 millicurie, 30.3 millicurie, Intravenous, Once PRN, Turner, Traci R, MD      Objective:   Vitals:   08/25/23 1049  BP: (!) 142/66  Pulse: 81  SpO2: 100%  Weight: 298 lb 12.8 oz (135.5 kg)  Height: 6\' 3"  (1.905 m)    Estimated body mass index is 37.35 kg/m as calculated from the following:   Height as of this encounter: 6\' 3"  (1.905 m).   Weight as of this encounter: 298 lb 12.8 oz (135.5 kg).  @WEIGHTCHANGE @  [employer]   08/25/23 1049  Weight: 298 lb 12.8 oz (135.5 kg)     Physical Exam   General: No distress. Looks well O2 at rest: no Cane present: no Sitting in wheel chair: no Frail: no Obese: no Neuro: Alert and Oriented x 3. GCS 15. Speech normal Psych: Pleasant Resp:  Barrel Chest - no.  Wheeze - no, Crackles - YES, No overt respiratory distress CVS: Normal heart sounds. Murmurs - no Ext: Stigmata of Connective Tissue Disease - no HEENT: Normal upper airway. PEERL +. No post nasal drip        Assessment:       ICD-10-CM   1. IPF (idiopathic pulmonary fibrosis) (HCC)  R60.454          Plan:     Patient Instructions     ICD-10-CM   1. IPF (idiopathic pulmonary fibrosis) (HCC)  U98.119  2. Obesity (BMI 30.0-34.9)  E66.9     3. Anemia, unspecified type  D64.9     4. Stage 3a chronic kidney disease (HCC)  N18.31       IPF  (idiopathic pulmonary fibrosis) (HCC)  - stable x 2 years but progressive from before that  - on supportive care  Plan  - continue supportive care per shared decision making -no clinical trials at this moment part of shared decision making  Obesity (BMI 30.0-34.9)  - too bad ozempic was denied  Plan  - talk to PCP about weight loss drugs and see if lung and heart disease will get you approved  Anemia, unspecified type Stage 3a chronic kidney disease (HCC)  Plan  - per PCP Farris Has, MD   Followup - Do spirometry and DLCO in 6 months -Return to see Dr. Marchelle Gearing in 6 months  -Symptom score and walk test at follow-up   FOLLOWUP No follow-ups on file.    SIGNATURE    Dr. Kalman Shan, M.D., F.C.C.P,  Pulmonary and Critical Care Medicine Staff Physician, [facility] Director - Interstitial Lung Disease  Program  Pulmonary Fibrosis [facility] at [facility] [place], [place], [address]  Pager: [phone], If no answer or between  15:00h - 7:00h: call [phone] Telephone: [phone]  11:08 AM 08/25/2023

## 2023-08-24 NOTE — Progress Notes (Signed)
Spirometry/DLCO performed today. 

## 2023-08-24 NOTE — Patient Instructions (Signed)
Spirometry/DLCO performed today. 

## 2023-08-25 ENCOUNTER — Ambulatory Visit: Payer: Medicare HMO | Admitting: Internal Medicine

## 2023-08-25 VITALS — BP 142/66 | HR 81 | Ht 75.0 in | Wt 298.8 lb

## 2023-08-25 DIAGNOSIS — J84112 Idiopathic pulmonary fibrosis: Secondary | ICD-10-CM

## 2023-08-29 DIAGNOSIS — Z08 Encounter for follow-up examination after completed treatment for malignant neoplasm: Secondary | ICD-10-CM | POA: Diagnosis not present

## 2023-08-29 DIAGNOSIS — Z85828 Personal history of other malignant neoplasm of skin: Secondary | ICD-10-CM | POA: Diagnosis not present

## 2023-10-03 DIAGNOSIS — C44319 Basal cell carcinoma of skin of other parts of face: Secondary | ICD-10-CM | POA: Diagnosis not present

## 2023-10-03 DIAGNOSIS — C44219 Basal cell carcinoma of skin of left ear and external auricular canal: Secondary | ICD-10-CM | POA: Diagnosis not present

## 2023-10-24 ENCOUNTER — Encounter: Payer: Self-pay | Admitting: Cardiology

## 2023-10-24 ENCOUNTER — Ambulatory Visit: Payer: Medicare HMO | Attending: Cardiology | Admitting: Cardiology

## 2023-10-24 VITALS — BP 120/58 | HR 58 | Ht 75.0 in | Wt 300.4 lb

## 2023-10-24 DIAGNOSIS — R0609 Other forms of dyspnea: Secondary | ICD-10-CM | POA: Diagnosis not present

## 2023-10-24 DIAGNOSIS — R600 Localized edema: Secondary | ICD-10-CM

## 2023-10-24 DIAGNOSIS — I739 Peripheral vascular disease, unspecified: Secondary | ICD-10-CM | POA: Diagnosis not present

## 2023-10-24 DIAGNOSIS — G4733 Obstructive sleep apnea (adult) (pediatric): Secondary | ICD-10-CM | POA: Diagnosis not present

## 2023-10-24 DIAGNOSIS — I10 Essential (primary) hypertension: Secondary | ICD-10-CM | POA: Diagnosis not present

## 2023-10-24 DIAGNOSIS — N1831 Chronic kidney disease, stage 3a: Secondary | ICD-10-CM

## 2023-10-24 DIAGNOSIS — I2510 Atherosclerotic heart disease of native coronary artery without angina pectoris: Secondary | ICD-10-CM

## 2023-10-24 DIAGNOSIS — E7800 Pure hypercholesterolemia, unspecified: Secondary | ICD-10-CM | POA: Diagnosis not present

## 2023-10-24 DIAGNOSIS — Z79899 Other long term (current) drug therapy: Secondary | ICD-10-CM | POA: Diagnosis not present

## 2023-10-24 MED ORDER — METOPROLOL SUCCINATE ER 100 MG PO TB24: ORAL_TABLET | ORAL | 3 refills | Status: DC

## 2023-10-24 MED ORDER — METOPROLOL SUCCINATE ER 100 MG PO TB24: ORAL_TABLET | ORAL | 3 refills | Status: AC

## 2023-10-24 MED ORDER — ATORVASTATIN CALCIUM 80 MG PO TABS: 80.0000 mg | ORAL_TABLET | Freq: Every day | ORAL | 3 refills | Status: DC

## 2023-10-24 MED ORDER — AMLODIPINE BESYLATE 10 MG PO TABS: 10.0000 mg | ORAL_TABLET | Freq: Every day | ORAL | 3 refills | Status: DC

## 2023-10-24 MED ORDER — ATORVASTATIN CALCIUM 80 MG PO TABS: 80.0000 mg | ORAL_TABLET | Freq: Every day | ORAL | 3 refills | Status: AC

## 2023-10-24 MED ORDER — AMLODIPINE BESYLATE 10 MG PO TABS: 10.0000 mg | ORAL_TABLET | Freq: Every day | ORAL | 3 refills | Status: AC

## 2023-10-24 NOTE — Patient Instructions (Signed)
Medication Instructions:  Refilled cardiac meds Your physician recommends that you continue on your current medications as directed. Please refer to the Current Medication list given to you today.  *If you need a refill on your cardiac medications before your next appointment, please call your pharmacy*   Lab Work: FASTING Lipids, CMET If you have labs (blood work) drawn today and your tests are completely normal, you will receive your results only by: MyChart Message (if you have MyChart) OR A paper copy in the mail If you have any lab test that is abnormal or we need to change your treatment, we will call you to review the results.   Testing/Procedures: ECHO Your physician has requested that you have an echocardiogram. Echocardiography is a painless test that uses sound waves to create images of your heart. It provides your doctor with information about the size and shape of your heart and how well your heart's chambers and valves are working. This procedure takes approximately one hour. There are no restrictions for this procedure. Please do NOT wear cologne, perfume, aftershave, or lotions (deodorant is allowed). Please arrive 15 minutes prior to your appointment time.  Please note: We ask at that you not bring children with you during ultrasound (echo/ vascular) testing. Due to room size and safety concerns, children are not allowed in the ultrasound rooms during exams. Our front office staff cannot provide observation of children in our lobby area while testing is being conducted. An adult accompanying a patient to their appointment will only be allowed in the ultrasound room at the discretion of the ultrasound technician under special circumstances. We apologize for any inconvenience.   Follow-Up: At [facility], you and your health needs are our priority.  As part of our continuing mission to provide you with exceptional heart care, we have created designated Provider Care  Teams.  These Care Teams include your primary Cardiologist (physician) and Advanced Practice Providers (APPs -  Physician Assistants and Nurse Practitioners) who all work together to provide you with the care you need, when you need it.  We recommend signing up for the patient portal called "MyChart".  Sign up information is provided on this After Visit Summary.  MyChart is used to connect with patients for Virtual Visits (Telemedicine).  Patients are able to view lab/test results, encounter notes, upcoming appointments, etc.  Non-urgent messages can be sent to your provider as well.   To learn more about what you can do with MyChart, go to [email or web address].    Your next appointment:   1 year(s)  Provider:   Armanda Magic, MD     Other Instructions   1st Floor: - Lobby - Registration  - Pharmacy  - Lab - Cafe  2nd Floor: - PV Lab - Diagnostic Testing (echo, CT, nuclear med)  3rd Floor: - Vacant  4th Floor: - TCTS (cardiothoracic surgery) - AFib Clinic - Structural Heart Clinic - Vascular Surgery  - Vascular Ultrasound  5th Floor: - [facility] (general and EP) - Clinical Pharmacy for coumadin, hypertension, lipid, weight-loss medications, and med management appointments    Valet parking services will be available as well.

## 2023-10-24 NOTE — Addendum Note (Signed)
Addended by: Erick Alley on: 10/24/2023 11:54 AM   Modules accepted: Orders

## 2023-10-24 NOTE — Addendum Note (Signed)
Addended by: Erick Alley on: 10/24/2023 11:44 AM   Modules accepted: Orders

## 2023-10-24 NOTE — Progress Notes (Signed)
Cardiology Office Note:    Date:  10/24/2023   ID:  Marcus [name], DOB 1941-05-04, MRN [id number]  PCP:  Farris Has, MD  Cardiologist:  Armanda Magic, MD    Referring MD: Farris Has, MD   Chief Complaint  Patient presents with   Coronary Artery Disease   Hypertension   Hyperlipidemia   Sleep Apnea   Shortness of Breath    History of Present Illness:    Marcus [name] is a 83 y.o. male with a hx of  ASCAD, HTN and dyslipidemia. He has a history of elevated ALT and was evaluated by GI and US showed an enlarged liver.  He stopped ETOH and tylenol and his ALT normalized.  LE arterial dopplers 20017 showed 30-49% left common femoral stenosis.  Stress test 02/2020 showed no ischemia.  2D echo done for CP and SOB 2022 showed showed normal LV function with grade 1 diastolic dysfunction and mild AI. He was dx with idiopathic pulmonary fibrosis and has chronic DOE.  He is followed by Dr. Marchelle Gearing with pulmonary.  He had a sleep study in 2021 showing mild obstructive sleep apnea with an AHI of 7.6/h and CPAP titration was recommended but patient declined stating he did not want to use CPAP.  he is here today for followup and is doing well.  He has chronic SOB related to his pulmonary fibrosis that appear stable.  He denies any chest pain or pressure, PND, orthopnea, dizziness (except when he stands up too fast), palpitations or syncope. He has some LE edema at times.  He is compliant with his meds and is tolerating meds with no SE.    Past Medical History:  Diagnosis Date   Ascending aorta dilatation (HCC)    38mm gy echo 01/2020   Benign essential HTN 09/09/2013   Chronic kidney disease    Coronary artery disease 08/2013   S/P PCI of the LAD   Dyslipidemia    LDL goal < 70   Elevated fasting glucose    Fatty liver    Glaucoma    Gout    Hypertension    Joint pain    LAD (lymphadenopathy)    Obesity    PVD (peripheral vascular disease) (HCC) 06/12/2018     30-49% left common  femoral stenosis   Vision abnormalities     Past Surgical History:  Procedure Laterality Date   ADENOIDECTOMY     CATARACT EXTRACTION     CORONARY ANGIOPLASTY WITH STENT PLACEMENT  2009   EYE SURGERY     TONSILLECTOMY     WISDOM TOOTH EXTRACTION      Current Medications: Current Meds  Medication Sig   allopurinol (ZYLOPRIM) 300 MG tablet Take 300 mg by mouth daily.   amLODipine (NORVASC) 10 MG tablet Take 1 tablet (10 mg total) by mouth daily.   aspirin 81 MG tablet Take 81 mg by mouth daily.   atorvastatin (LIPITOR) 80 MG tablet Take 1 tablet (80 mg total) by mouth daily.   cholecalciferol (VITAMIN D3) 25 MCG (1000 UNIT) tablet Take 1,000 Units by mouth daily.   Cyanocobalamin (VITAMIN B-12 PO) Take 1 capsule by mouth daily.   Dextromethorphan-guaiFENesin (MUCINEX DM PO) Take 1 tablet by mouth daily.   dorzolamide-timolol (COSOPT) 22.3-6.8 MG/ML ophthalmic solution Place 1 drop into the left eye 2 (two) times daily.   ferrous sulfate 325 (65 FE) MG EC tablet Take 325 mg by mouth 3 (three) times daily with meals.   metoprolol succinate (TOPROL-XL) 100 MG  24 hr tablet Take with or immediately following a meal.   Netarsudil-Latanoprost (ROCKLATAN) 0.02-0.005 % SOLN As directed in left eye   nitroGLYCERIN (NITROSTAT) 0.4 MG SL tablet Place 0.4 mg under the tongue every 5 (five) minutes as needed for chest pain.   Omega-3 Fatty Acids (OMEGA 3 PO) Take 2 capsules by mouth in the morning and at bedtime. 2000 mg BID   pimecrolimus (ELIDEL) 1 % cream Apply 1 Application topically 2 (two) times daily. To affected area on the face   tamsulosin (FLOMAX) 0.4 MG CAPS capsule Take 0.4 mg by mouth daily.     Allergies:   Pirfenidone and Ofev [nintedanib]   Social History   Socioeconomic History   Marital status: Married    Spouse name: Not on file   Number of children: Not on file   Years of education: Not on file   Highest education level: Not on file  Occupational History   Not on  file  Tobacco Use   Smoking status: Former    Types: Cigars    Quit date: 02/07/2020    Years since quitting: 3.7   Smokeless tobacco: Never   Tobacco comments:    3-4 cigars daily.  off and on for 10 years.  Stopped smoking cigarettes at 23.  smoked from age [age], 2 packs per day.  Stopped smoking cigars back in May 2021.  Substance and Sexual Activity   Alcohol use: Yes    Alcohol/week: 1.0 standard drink of alcohol    Types: 1 Cans of beer per week    Comment: 2-3 beers daily   Drug use: No   Sexual activity: Not Currently  Other Topics Concern   Not on file  Social History Narrative   Not on file   Social Drivers of Health   Financial Resource Strain: Not on file  Food Insecurity: No Food Insecurity (08/19/2022)   Hunger Vital Sign    Worried About Running Out of Food in the Last Year: Never true    Ran Out of Food in the Last Year: Never true  Transportation Needs: No Transportation Needs (08/19/2022)   PRAPARE - Administrator, Civil Service (Medical): No    Lack of Transportation (Non-Medical): No  Physical Activity: Not on file  Stress: Not on file  Social Connections: Not on file     Family History: The patient's family history includes Breast cancer in his mother; CAD in his brother and sister; Heart attack in his father; Multiple sclerosis in his sister; Rheumatic fever in his father. There is no history of Neuropathy.  ROS:   Please see the history of present illness.    ROS  All other systems reviewed and negative.   EKGs/Labs/Other Studies Reviewed:    The following studies were reviewed today:  EKG Interpretation Date/Time:  Tuesday October 24 2023 11:19:18 EST Ventricular Rate:  58 PR Interval:  170 QRS Duration:  104 QT Interval:  400 QTC Calculation: 392 R Axis:   78  Text Interpretation: Sinus bradycardia When compared with ECG of 02-Mar-2001 11:08, No significant change was found Confirmed by Armanda Magic ([id number]) on 10/24/2023  11:32:57 AM    Recent Labs: No results found for requested labs within last 365 days.   Recent Lipid Panel    Component Value Date/Time   CHOL 115 08/17/2021 0916   CHOL 105 08/01/2014 0835   TRIG 186 (H) 08/17/2021 0916   TRIG 182 (H) 08/01/2014 0835   HDL 33 (L)  08/17/2021 0916   HDL 26 (L) 08/01/2014 0835   CHOLHDL 3.5 08/17/2021 0916   CHOLHDL 3.7 04/18/2016 0922   VLDL 41 (H) 04/18/2016 0922   LDLCALC 51 08/17/2021 0916   LDLCALC 43 08/01/2014 0835    Physical Exam:    VS:  BP (!) 120/58   Pulse (!) 58   Ht 6\' 3"  (1.905 m)   Wt (!) 300 lb 6.4 oz (136.3 kg)   SpO2 96%   BMI 37.55 kg/m     Wt Readings from Last 3 Encounters:  10/24/23 (!) 300 lb 6.4 oz (136.3 kg)  08/25/23 298 lb 12.8 oz (135.5 kg)  09/20/22 280 lb (127 kg)    GEN: Well nourished, well developed in no acute distress HEENT: Normal NECK: No JVD; No carotid bruits LYMPHATICS: No lymphadenopathy CARDIAC:RRR, no murmurs, rubs, gallops RESPIRATORY:  Clear to auscultation without rales, wheezing or rhonchi  ABDOMEN: Soft, non-tender, non-distended MUSCULOSKELETAL:  No edema; No deformity  SKIN: Warm and dry NEUROLOGIC:  Alert and oriented x 3 PSYCHIATRIC:  Normal affect  ASSESSMENT:    1. Atherosclerosis of native coronary artery of native heart without angina pectoris   2. Benign essential HTN   3. Pure hypercholesterolemia   4. PVD (peripheral vascular disease) (HCC)   5. OSA (obstructive sleep apnea)   6. DOE (dyspnea on exertion)   7. Stage 3a chronic kidney disease (HCC)    PLAN:    In order of problems listed above:  1.  ASCAD/DOE -s/p remote PCI of LAD -Lexiscan Myoview 2021 showed no ischemia -2D echo 02/2021 showed normal LV function with grade 1 diastolic dysfunction -He has a shortness of breath related to ILD but denies any chest pain -Continue prescription drug management with aspirin 81 mg daily, Toprol-XL 100 mg daily and statin therapy with as needed refills>>refilled  today  2.  HTN -BP controlled on exam today -Continue prescription drug managed with amlodipine 10 mg daily, Toprol-XL 100 mg daily with as needed refills>>Refilled today  3.  HLD -LDL goal < 70 -Check FLP and ALT -Continue prescription drug management with atorvastatin 80 mg daily with as needed refills  4.  PVD -LE arterial dopplers 20017 showed 30-49% left common femoral stenosis.   -He denies any recent claudication symptoms -Continue walking program -continue statin  5.  Obstructive sleep apnea -Mild by home sleep study 2021 -Patient refused CPAP therapy  6.  Chronic shortness of breath -Related to interstitial lung disease -Followed by pulmonary  7.  CKD stage 3a -SCr bumped last month to 1.73 -Repeat bmet  8.  Chronic LE edema -it is mild today -I have given him a Rx for TED hose -check 2D echo to make sure EF remains normal and no PHTN from IPF  Followup with me in 1 year  Medication Adjustments/Labs and Tests Ordered: Current medicines are reviewed at length with the patient today.  Concerns regarding medicines are outlined above.  Orders Placed This Encounter  Procedures   EKG 12-Lead    No orders of the defined types were placed in this encounter.   Signed, Armanda Magic, MD  10/24/2023 11:32 AM    [facility]

## 2023-10-25 DIAGNOSIS — H57813 Brow ptosis, bilateral: Secondary | ICD-10-CM | POA: Diagnosis not present

## 2023-10-25 DIAGNOSIS — H02423 Myogenic ptosis of bilateral eyelids: Secondary | ICD-10-CM | POA: Diagnosis not present

## 2023-10-25 DIAGNOSIS — H0279 Other degenerative disorders of eyelid and periocular area: Secondary | ICD-10-CM | POA: Diagnosis not present

## 2023-10-25 DIAGNOSIS — H02024 Mechanical entropion of left upper eyelid: Secondary | ICD-10-CM | POA: Diagnosis not present

## 2023-10-25 DIAGNOSIS — H02224 Mechanical lagophthalmos left upper eyelid: Secondary | ICD-10-CM | POA: Diagnosis not present

## 2023-10-25 DIAGNOSIS — H16212 Exposure keratoconjunctivitis, left eye: Secondary | ICD-10-CM | POA: Diagnosis not present

## 2023-10-25 LAB — LIPID PANEL
Chol/HDL Ratio: 3.2 {ratio} (ref 0.0–5.0)
Cholesterol, Total: 120 mg/dL (ref 100–199)
HDL: 37 mg/dL — ABNORMAL LOW (ref >39)
LDL Chol Calc (NIH): 57 mg/dL (ref 0–99)
Triglycerides: 150 mg/dL — ABNORMAL HIGH (ref 0–149)
VLDL Cholesterol Cal: 26 mg/dL (ref 5–40)

## 2023-10-25 LAB — COMPREHENSIVE METABOLIC PANEL
ALT: 25 [IU]/L (ref 0–44)
AST: 27 [IU]/L (ref 0–40)
Albumin: 4.1 g/dL (ref 3.7–4.7)
Alkaline Phosphatase: 66 [IU]/L (ref 44–121)
BUN/Creatinine Ratio: 17 (ref 10–24)
BUN: 33 mg/dL — ABNORMAL HIGH (ref 8–27)
Bilirubin Total: 0.8 mg/dL (ref 0.0–1.2)
CO2: 23 mmol/L (ref 20–29)
Calcium: 10.3 mg/dL — ABNORMAL HIGH (ref 8.6–10.2)
Chloride: 107 mmol/L — ABNORMAL HIGH (ref 96–106)
Creatinine, Ser: 1.98 mg/dL — ABNORMAL HIGH (ref 0.76–1.27)
Globulin, Total: 2.4 g/dL (ref 1.5–4.5)
Glucose: 118 mg/dL — ABNORMAL HIGH (ref 70–99)
Potassium: 5.6 mmol/L — ABNORMAL HIGH (ref 3.5–5.2)
Sodium: 144 mmol/L (ref 134–144)
Total Protein: 6.5 g/dL (ref 6.0–8.5)
eGFR: 33 mL/min/{1.73_m2} — ABNORMAL LOW (ref >59)

## 2023-10-30 ENCOUNTER — Telehealth: Payer: Self-pay

## 2023-10-30 NOTE — Telephone Encounter (Signed)
-----   Message from Armanda Magic sent at 10/25/2023  9:48 AM EST ----- Abnormal renal function but appears it baseline compared to a year ago.  Potassium increased.  Please make sure he is not taking any vitamins that have potassium in him and tell him to avoid high potassium food.  Please forward these labs to his PCP.  Lipids look good

## 2023-10-30 NOTE — Telephone Encounter (Signed)
Call to patient to advise of abnormal renal function but per Dr. Mayford Knife, it  appears it is baseline compared to a year ago. However, potassium increased. Advised that Lipids look good per Dr. Mayford Knife.   Patient verbalizes understanding and denies taking any multivitamins or potassium supplements. Patient states he has an upcoming visit with PCP and will examine diet to reduce K+ intake, then discuss retesting with PCP. Labs forwarded to PCP.

## 2023-11-10 DIAGNOSIS — H16202 Unspecified keratoconjunctivitis, left eye: Secondary | ICD-10-CM | POA: Diagnosis not present

## 2023-11-10 DIAGNOSIS — Z961 Presence of intraocular lens: Secondary | ICD-10-CM | POA: Diagnosis not present

## 2023-11-10 DIAGNOSIS — H401133 Primary open-angle glaucoma, bilateral, severe stage: Secondary | ICD-10-CM | POA: Diagnosis not present

## 2023-11-10 DIAGNOSIS — H35371 Puckering of macula, right eye: Secondary | ICD-10-CM | POA: Diagnosis not present

## 2023-11-10 DIAGNOSIS — Z9889 Other specified postprocedural states: Secondary | ICD-10-CM | POA: Diagnosis not present

## 2023-11-14 ENCOUNTER — Ambulatory Visit (HOSPITAL_COMMUNITY): Payer: Medicare HMO | Attending: Cardiology

## 2023-11-14 ENCOUNTER — Encounter: Payer: Self-pay | Admitting: Cardiology

## 2023-11-14 DIAGNOSIS — C44212 Basal cell carcinoma of skin of right ear and external auricular canal: Secondary | ICD-10-CM | POA: Diagnosis not present

## 2023-11-14 DIAGNOSIS — X32XXXD Exposure to sunlight, subsequent encounter: Secondary | ICD-10-CM | POA: Diagnosis not present

## 2023-11-14 DIAGNOSIS — R600 Localized edema: Secondary | ICD-10-CM | POA: Diagnosis not present

## 2023-11-14 DIAGNOSIS — C44319 Basal cell carcinoma of skin of other parts of face: Secondary | ICD-10-CM | POA: Diagnosis not present

## 2023-11-14 DIAGNOSIS — L570 Actinic keratosis: Secondary | ICD-10-CM | POA: Diagnosis not present

## 2023-11-14 DIAGNOSIS — I351 Nonrheumatic aortic (valve) insufficiency: Secondary | ICD-10-CM | POA: Insufficient documentation

## 2023-11-14 DIAGNOSIS — C4441 Basal cell carcinoma of skin of scalp and neck: Secondary | ICD-10-CM | POA: Diagnosis not present

## 2023-11-14 LAB — ECHOCARDIOGRAM COMPLETE
Area-P 1/2: 3.21 cm2
P 1/2 time: 586 ms
S' Lateral: 3.6 cm

## 2023-11-22 ENCOUNTER — Other Ambulatory Visit: Payer: Self-pay

## 2023-11-22 DIAGNOSIS — I351 Nonrheumatic aortic (valve) insufficiency: Secondary | ICD-10-CM

## 2023-12-27 DIAGNOSIS — H401133 Primary open-angle glaucoma, bilateral, severe stage: Secondary | ICD-10-CM | POA: Diagnosis not present

## 2023-12-27 DIAGNOSIS — H16202 Unspecified keratoconjunctivitis, left eye: Secondary | ICD-10-CM | POA: Diagnosis not present

## 2023-12-27 DIAGNOSIS — H35371 Puckering of macula, right eye: Secondary | ICD-10-CM | POA: Diagnosis not present

## 2023-12-27 DIAGNOSIS — Z9889 Other specified postprocedural states: Secondary | ICD-10-CM | POA: Diagnosis not present

## 2023-12-27 DIAGNOSIS — Z961 Presence of intraocular lens: Secondary | ICD-10-CM | POA: Diagnosis not present

## 2023-12-28 DIAGNOSIS — H0279 Other degenerative disorders of eyelid and periocular area: Secondary | ICD-10-CM | POA: Diagnosis not present

## 2023-12-28 DIAGNOSIS — H02423 Myogenic ptosis of bilateral eyelids: Secondary | ICD-10-CM | POA: Diagnosis not present

## 2023-12-28 DIAGNOSIS — H57813 Brow ptosis, bilateral: Secondary | ICD-10-CM | POA: Diagnosis not present

## 2023-12-28 DIAGNOSIS — H16212 Exposure keratoconjunctivitis, left eye: Secondary | ICD-10-CM | POA: Diagnosis not present

## 2023-12-28 DIAGNOSIS — H02224 Mechanical lagophthalmos left upper eyelid: Secondary | ICD-10-CM | POA: Diagnosis not present

## 2023-12-28 DIAGNOSIS — H02024 Mechanical entropion of left upper eyelid: Secondary | ICD-10-CM | POA: Diagnosis not present

## 2024-01-16 DIAGNOSIS — Z85828 Personal history of other malignant neoplasm of skin: Secondary | ICD-10-CM | POA: Diagnosis not present

## 2024-01-16 DIAGNOSIS — M109 Gout, unspecified: Secondary | ICD-10-CM | POA: Diagnosis not present

## 2024-01-16 DIAGNOSIS — N400 Enlarged prostate without lower urinary tract symptoms: Secondary | ICD-10-CM | POA: Diagnosis not present

## 2024-01-16 DIAGNOSIS — J8410 Pulmonary fibrosis, unspecified: Secondary | ICD-10-CM | POA: Diagnosis not present

## 2024-01-16 DIAGNOSIS — L218 Other seborrheic dermatitis: Secondary | ICD-10-CM | POA: Diagnosis not present

## 2024-01-16 DIAGNOSIS — R7301 Impaired fasting glucose: Secondary | ICD-10-CM | POA: Diagnosis not present

## 2024-01-16 DIAGNOSIS — I77810 Thoracic aortic ectasia: Secondary | ICD-10-CM | POA: Diagnosis not present

## 2024-01-16 DIAGNOSIS — L570 Actinic keratosis: Secondary | ICD-10-CM | POA: Diagnosis not present

## 2024-01-16 DIAGNOSIS — Z0000 Encounter for general adult medical examination without abnormal findings: Secondary | ICD-10-CM | POA: Diagnosis not present

## 2024-01-16 DIAGNOSIS — C44319 Basal cell carcinoma of skin of other parts of face: Secondary | ICD-10-CM | POA: Diagnosis not present

## 2024-01-16 DIAGNOSIS — C4441 Basal cell carcinoma of skin of scalp and neck: Secondary | ICD-10-CM | POA: Diagnosis not present

## 2024-01-16 DIAGNOSIS — I10 Essential (primary) hypertension: Secondary | ICD-10-CM | POA: Diagnosis not present

## 2024-01-16 DIAGNOSIS — D649 Anemia, unspecified: Secondary | ICD-10-CM | POA: Diagnosis not present

## 2024-01-16 DIAGNOSIS — N1832 Chronic kidney disease, stage 3b: Secondary | ICD-10-CM | POA: Diagnosis not present

## 2024-01-16 DIAGNOSIS — X32XXXD Exposure to sunlight, subsequent encounter: Secondary | ICD-10-CM | POA: Diagnosis not present

## 2024-01-16 DIAGNOSIS — E782 Mixed hyperlipidemia: Secondary | ICD-10-CM | POA: Diagnosis not present

## 2024-01-16 DIAGNOSIS — I739 Peripheral vascular disease, unspecified: Secondary | ICD-10-CM | POA: Diagnosis not present

## 2024-01-16 DIAGNOSIS — Z08 Encounter for follow-up examination after completed treatment for malignant neoplasm: Secondary | ICD-10-CM | POA: Diagnosis not present

## 2024-01-16 DIAGNOSIS — I25119 Atherosclerotic heart disease of native coronary artery with unspecified angina pectoris: Secondary | ICD-10-CM | POA: Diagnosis not present

## 2024-02-12 DIAGNOSIS — H401133 Primary open-angle glaucoma, bilateral, severe stage: Secondary | ICD-10-CM | POA: Diagnosis not present

## 2024-02-16 DIAGNOSIS — N289 Disorder of kidney and ureter, unspecified: Secondary | ICD-10-CM | POA: Diagnosis not present

## 2024-02-21 DIAGNOSIS — D485 Neoplasm of uncertain behavior of skin: Secondary | ICD-10-CM | POA: Diagnosis not present

## 2024-02-21 DIAGNOSIS — H16212 Exposure keratoconjunctivitis, left eye: Secondary | ICD-10-CM | POA: Diagnosis not present

## 2024-02-21 DIAGNOSIS — H57813 Brow ptosis, bilateral: Secondary | ICD-10-CM | POA: Diagnosis not present

## 2024-02-21 DIAGNOSIS — H0279 Other degenerative disorders of eyelid and periocular area: Secondary | ICD-10-CM | POA: Diagnosis not present

## 2024-02-21 DIAGNOSIS — H02224 Mechanical lagophthalmos left upper eyelid: Secondary | ICD-10-CM | POA: Diagnosis not present

## 2024-02-21 DIAGNOSIS — H02024 Mechanical entropion of left upper eyelid: Secondary | ICD-10-CM | POA: Diagnosis not present

## 2024-02-21 DIAGNOSIS — H02423 Myogenic ptosis of bilateral eyelids: Secondary | ICD-10-CM | POA: Diagnosis not present

## 2024-03-08 DIAGNOSIS — R0602 Shortness of breath: Secondary | ICD-10-CM | POA: Diagnosis not present

## 2024-03-08 DIAGNOSIS — J168 Pneumonia due to other specified infectious organisms: Secondary | ICD-10-CM | POA: Diagnosis not present

## 2024-03-08 DIAGNOSIS — J9811 Atelectasis: Secondary | ICD-10-CM | POA: Diagnosis not present

## 2024-03-08 DIAGNOSIS — R509 Fever, unspecified: Secondary | ICD-10-CM | POA: Diagnosis not present

## 2024-03-08 DIAGNOSIS — R059 Cough, unspecified: Secondary | ICD-10-CM | POA: Diagnosis not present

## 2024-03-08 DIAGNOSIS — Z743 Need for continuous supervision: Secondary | ICD-10-CM | POA: Diagnosis not present

## 2024-03-08 DIAGNOSIS — I10 Essential (primary) hypertension: Secondary | ICD-10-CM | POA: Diagnosis not present

## 2024-03-08 DIAGNOSIS — J189 Pneumonia, unspecified organism: Secondary | ICD-10-CM | POA: Diagnosis not present

## 2024-03-09 DIAGNOSIS — N1831 Chronic kidney disease, stage 3a: Secondary | ICD-10-CM | POA: Diagnosis not present

## 2024-03-09 DIAGNOSIS — R519 Headache, unspecified: Secondary | ICD-10-CM | POA: Diagnosis not present

## 2024-03-09 DIAGNOSIS — J189 Pneumonia, unspecified organism: Secondary | ICD-10-CM | POA: Diagnosis not present

## 2024-03-09 DIAGNOSIS — A419 Sepsis, unspecified organism: Secondary | ICD-10-CM | POA: Diagnosis not present

## 2024-03-09 DIAGNOSIS — J029 Acute pharyngitis, unspecified: Secondary | ICD-10-CM | POA: Diagnosis not present

## 2024-03-09 DIAGNOSIS — I10 Essential (primary) hypertension: Secondary | ICD-10-CM | POA: Diagnosis not present

## 2024-03-09 DIAGNOSIS — M436 Torticollis: Secondary | ICD-10-CM | POA: Diagnosis not present

## 2024-03-09 DIAGNOSIS — N179 Acute kidney failure, unspecified: Secondary | ICD-10-CM | POA: Diagnosis not present

## 2024-03-09 DIAGNOSIS — E876 Hypokalemia: Secondary | ICD-10-CM | POA: Diagnosis not present

## 2024-03-09 DIAGNOSIS — R509 Fever, unspecified: Secondary | ICD-10-CM | POA: Diagnosis not present

## 2024-03-10 DIAGNOSIS — R509 Fever, unspecified: Secondary | ICD-10-CM | POA: Diagnosis not present

## 2024-03-10 DIAGNOSIS — N179 Acute kidney failure, unspecified: Secondary | ICD-10-CM | POA: Diagnosis not present

## 2024-03-10 DIAGNOSIS — M436 Torticollis: Secondary | ICD-10-CM | POA: Diagnosis not present

## 2024-03-10 DIAGNOSIS — N1831 Chronic kidney disease, stage 3a: Secondary | ICD-10-CM | POA: Diagnosis not present

## 2024-03-10 DIAGNOSIS — R519 Headache, unspecified: Secondary | ICD-10-CM | POA: Diagnosis not present

## 2024-03-10 DIAGNOSIS — E876 Hypokalemia: Secondary | ICD-10-CM | POA: Diagnosis not present

## 2024-03-10 DIAGNOSIS — I10 Essential (primary) hypertension: Secondary | ICD-10-CM | POA: Diagnosis not present

## 2024-03-10 DIAGNOSIS — I509 Heart failure, unspecified: Secondary | ICD-10-CM | POA: Diagnosis not present

## 2024-03-10 DIAGNOSIS — J029 Acute pharyngitis, unspecified: Secondary | ICD-10-CM | POA: Diagnosis not present

## 2024-03-10 DIAGNOSIS — R1310 Dysphagia, unspecified: Secondary | ICD-10-CM | POA: Diagnosis not present

## 2024-03-10 DIAGNOSIS — A419 Sepsis, unspecified organism: Secondary | ICD-10-CM | POA: Diagnosis not present

## 2024-03-11 DIAGNOSIS — I10 Essential (primary) hypertension: Secondary | ICD-10-CM | POA: Diagnosis not present

## 2024-03-11 DIAGNOSIS — J029 Acute pharyngitis, unspecified: Secondary | ICD-10-CM | POA: Diagnosis not present

## 2024-03-11 DIAGNOSIS — R519 Headache, unspecified: Secondary | ICD-10-CM | POA: Diagnosis not present

## 2024-03-11 DIAGNOSIS — N179 Acute kidney failure, unspecified: Secondary | ICD-10-CM | POA: Diagnosis not present

## 2024-03-11 DIAGNOSIS — R509 Fever, unspecified: Secondary | ICD-10-CM | POA: Diagnosis not present

## 2024-03-11 DIAGNOSIS — N1831 Chronic kidney disease, stage 3a: Secondary | ICD-10-CM | POA: Diagnosis not present

## 2024-03-11 DIAGNOSIS — M436 Torticollis: Secondary | ICD-10-CM | POA: Diagnosis not present

## 2024-03-11 DIAGNOSIS — E876 Hypokalemia: Secondary | ICD-10-CM | POA: Diagnosis not present

## 2024-03-11 DIAGNOSIS — A419 Sepsis, unspecified organism: Secondary | ICD-10-CM | POA: Diagnosis not present

## 2024-03-13 DIAGNOSIS — M1990 Unspecified osteoarthritis, unspecified site: Secondary | ICD-10-CM | POA: Diagnosis not present

## 2024-03-13 DIAGNOSIS — A419 Sepsis, unspecified organism: Secondary | ICD-10-CM | POA: Diagnosis not present

## 2024-03-13 DIAGNOSIS — Z7982 Long term (current) use of aspirin: Secondary | ICD-10-CM | POA: Diagnosis not present

## 2024-03-13 DIAGNOSIS — N400 Enlarged prostate without lower urinary tract symptoms: Secondary | ICD-10-CM | POA: Diagnosis not present

## 2024-03-13 DIAGNOSIS — E785 Hyperlipidemia, unspecified: Secondary | ICD-10-CM | POA: Diagnosis not present

## 2024-03-13 DIAGNOSIS — I10 Essential (primary) hypertension: Secondary | ICD-10-CM | POA: Diagnosis not present

## 2024-03-13 DIAGNOSIS — J8410 Pulmonary fibrosis, unspecified: Secondary | ICD-10-CM | POA: Diagnosis not present

## 2024-03-13 DIAGNOSIS — J189 Pneumonia, unspecified organism: Secondary | ICD-10-CM | POA: Diagnosis not present

## 2024-03-14 DIAGNOSIS — M6281 Muscle weakness (generalized): Secondary | ICD-10-CM | POA: Diagnosis not present

## 2024-03-14 DIAGNOSIS — A419 Sepsis, unspecified organism: Secondary | ICD-10-CM | POA: Diagnosis not present

## 2024-03-14 DIAGNOSIS — N400 Enlarged prostate without lower urinary tract symptoms: Secondary | ICD-10-CM | POA: Diagnosis not present

## 2024-03-14 DIAGNOSIS — I10 Essential (primary) hypertension: Secondary | ICD-10-CM | POA: Diagnosis not present

## 2024-03-14 DIAGNOSIS — Z7982 Long term (current) use of aspirin: Secondary | ICD-10-CM | POA: Diagnosis not present

## 2024-03-14 DIAGNOSIS — J189 Pneumonia, unspecified organism: Secondary | ICD-10-CM | POA: Diagnosis not present

## 2024-03-14 DIAGNOSIS — M1990 Unspecified osteoarthritis, unspecified site: Secondary | ICD-10-CM | POA: Diagnosis not present

## 2024-03-14 DIAGNOSIS — E785 Hyperlipidemia, unspecified: Secondary | ICD-10-CM | POA: Diagnosis not present

## 2024-03-14 DIAGNOSIS — J8410 Pulmonary fibrosis, unspecified: Secondary | ICD-10-CM | POA: Diagnosis not present

## 2024-03-18 DIAGNOSIS — M1990 Unspecified osteoarthritis, unspecified site: Secondary | ICD-10-CM | POA: Diagnosis not present

## 2024-03-18 DIAGNOSIS — N400 Enlarged prostate without lower urinary tract symptoms: Secondary | ICD-10-CM | POA: Diagnosis not present

## 2024-03-18 DIAGNOSIS — J189 Pneumonia, unspecified organism: Secondary | ICD-10-CM | POA: Diagnosis not present

## 2024-03-18 DIAGNOSIS — A419 Sepsis, unspecified organism: Secondary | ICD-10-CM | POA: Diagnosis not present

## 2024-03-18 DIAGNOSIS — E785 Hyperlipidemia, unspecified: Secondary | ICD-10-CM | POA: Diagnosis not present

## 2024-03-18 DIAGNOSIS — I10 Essential (primary) hypertension: Secondary | ICD-10-CM | POA: Diagnosis not present

## 2024-03-18 DIAGNOSIS — Z7982 Long term (current) use of aspirin: Secondary | ICD-10-CM | POA: Diagnosis not present

## 2024-03-18 DIAGNOSIS — J8410 Pulmonary fibrosis, unspecified: Secondary | ICD-10-CM | POA: Diagnosis not present

## 2024-03-19 DIAGNOSIS — Z7982 Long term (current) use of aspirin: Secondary | ICD-10-CM | POA: Diagnosis not present

## 2024-03-19 DIAGNOSIS — J8410 Pulmonary fibrosis, unspecified: Secondary | ICD-10-CM | POA: Diagnosis not present

## 2024-03-19 DIAGNOSIS — M1990 Unspecified osteoarthritis, unspecified site: Secondary | ICD-10-CM | POA: Diagnosis not present

## 2024-03-19 DIAGNOSIS — J189 Pneumonia, unspecified organism: Secondary | ICD-10-CM | POA: Diagnosis not present

## 2024-03-19 DIAGNOSIS — E785 Hyperlipidemia, unspecified: Secondary | ICD-10-CM | POA: Diagnosis not present

## 2024-03-19 DIAGNOSIS — A419 Sepsis, unspecified organism: Secondary | ICD-10-CM | POA: Diagnosis not present

## 2024-03-19 DIAGNOSIS — I10 Essential (primary) hypertension: Secondary | ICD-10-CM | POA: Diagnosis not present

## 2024-03-19 DIAGNOSIS — N400 Enlarged prostate without lower urinary tract symptoms: Secondary | ICD-10-CM | POA: Diagnosis not present

## 2024-03-25 ENCOUNTER — Telehealth: Payer: Self-pay

## 2024-03-25 DIAGNOSIS — Z09 Encounter for follow-up examination after completed treatment for conditions other than malignant neoplasm: Secondary | ICD-10-CM | POA: Diagnosis not present

## 2024-03-25 DIAGNOSIS — N289 Disorder of kidney and ureter, unspecified: Secondary | ICD-10-CM | POA: Diagnosis not present

## 2024-03-25 DIAGNOSIS — H401113 Primary open-angle glaucoma, right eye, severe stage: Secondary | ICD-10-CM | POA: Diagnosis not present

## 2024-03-25 DIAGNOSIS — H401123 Primary open-angle glaucoma, left eye, severe stage: Secondary | ICD-10-CM | POA: Diagnosis not present

## 2024-03-25 DIAGNOSIS — I10 Essential (primary) hypertension: Secondary | ICD-10-CM | POA: Diagnosis not present

## 2024-03-25 NOTE — Telephone Encounter (Signed)
   Pre-operative Risk Assessment    Patient Name: Marcus [name]  DOB: September 13, 1940 MRN: [id number]   Date of last office visit: 10/24/23 DR TURNER Date of next office visit: NONE   Request for Surgical Clearance    Procedure:  BILATERAL BROW PTOSIS REPAIR, BILATERAL UPPER EYELID ENTROPION REPAIR, LEFT TARSORRHAPHY  Date of Surgery:  Clearance 05/02/24                                Surgeon:  DR REFUGIO NANNY Surgeon's Group or Practice Name:  [facility] Phone number:  [phone] Fax number:  [phone]   Type of Clearance Requested:   - Medical  - Pharmacy:  Hold Aspirin  NOT INDICATED   Type of Anesthesia:  Local    Additional requests/questions:    Signed, Nirav Sweda   03/25/2024, 4:29 PM

## 2024-03-26 DIAGNOSIS — X32XXXD Exposure to sunlight, subsequent encounter: Secondary | ICD-10-CM | POA: Diagnosis not present

## 2024-03-26 DIAGNOSIS — L218 Other seborrheic dermatitis: Secondary | ICD-10-CM | POA: Diagnosis not present

## 2024-03-26 DIAGNOSIS — C44319 Basal cell carcinoma of skin of other parts of face: Secondary | ICD-10-CM | POA: Diagnosis not present

## 2024-03-26 DIAGNOSIS — L570 Actinic keratosis: Secondary | ICD-10-CM | POA: Diagnosis not present

## 2024-03-27 ENCOUNTER — Telehealth: Payer: Self-pay

## 2024-03-27 NOTE — Telephone Encounter (Signed)
Patient has been scheduled for televisit.

## 2024-03-27 NOTE — Telephone Encounter (Signed)
 Patient has been scheduled for televisit and consent done     Patient Consent for Virtual Visit         Marcus [name] has provided verbal consent on 03/27/2024 for a virtual visit (video or telephone).   CONSENT FOR VIRTUAL VISIT FOR:  Marcus [name]  By participating in this virtual visit I agree to the following:  I hereby voluntarily request, consent and authorize [facility] and its employed or contracted physicians, physician assistants, nurse practitioners or other licensed health care professionals (the Practitioner), to provide me with telemedicine health care services (the "Services) as deemed necessary by the treating Practitioner. I acknowledge and consent to receive the Services by the Practitioner via telemedicine. I understand that the telemedicine visit will involve communicating with the Practitioner through live audiovisual communication technology and the disclosure of certain medical information by electronic transmission. I acknowledge that I have been given the opportunity to request an in-person assessment or other available alternative prior to the telemedicine visit and am voluntarily participating in the telemedicine visit.  I understand that I have the right to withhold or withdraw my consent to the use of telemedicine in the course of my care at any time, without affecting my right to future care or treatment, and that the Practitioner or I may terminate the telemedicine visit at any time. I understand that I have the right to inspect all information obtained and/or recorded in the course of the telemedicine visit and may receive copies of available information for a reasonable fee.  I understand that some of the potential risks of receiving the Services via telemedicine include:  Delay or interruption in medical evaluation due to technological equipment failure or disruption; Information transmitted may not be sufficient (e.g. poor resolution of images) to  allow for appropriate medical decision making by the Practitioner; and/or  In rare instances, security protocols could fail, causing a breach of personal health information.  Furthermore, I acknowledge that it is my responsibility to provide information about my medical history, conditions and care that is complete and accurate to the best of my ability. I acknowledge that Practitioner's advice, recommendations, and/or decision may be based on factors not within their control, such as incomplete or inaccurate data provided by me or distortions of diagnostic images or specimens that may result from electronic transmissions. I understand that the practice of medicine is not an exact science and that Practitioner makes no warranties or guarantees regarding treatment outcomes. I acknowledge that a copy of this consent can be made available to me via my patient portal [facility] MyChart), or I can request a printed copy by calling the office of [facility].    I understand that my insurance will be billed for this visit.   I have read or had this consent read to me. I understand the contents of this consent, which adequately explains the benefits and risks of the Services being provided via telemedicine.  I have been provided ample opportunity to ask questions regarding this consent and the Services and have had my questions answered to my satisfaction. I give my informed consent for the services to be provided through the use of telemedicine in my medical care

## 2024-03-27 NOTE — Telephone Encounter (Signed)
   Name: Marcus [name]  DOB: September 05, 1941  MRN: [id number]  Primary Cardiologist: Wilbert Bihari, MD   Preoperative team, please contact this patient and set up a phone call appointment for further preoperative risk assessment. Please obtain consent and complete medication review. Thank you for your help.Las seen by Dr.Turner on 10/24/2023  I confirm that guidance regarding antiplatelet and oral anticoagulation therapy has been completed and, if necessary, noted below.  Per office protocol, if patient is without any new symptoms or concerns at the time of their virtual visit, he may hold aspirin  for 7 days prior to procedure. Please resume aspirin  as soon as possible postprocedure, at the discretion of the surgeon.    I also confirmed the patient resides in the state of [place] . As per [facility] Medical Board telemedicine laws, the patient must reside in the state in which the provider is licensed.   Lamarr Satterfield, NP 03/27/2024, 9:39 AM [facility]

## 2024-04-22 ENCOUNTER — Ambulatory Visit: Attending: Internal Medicine

## 2024-04-22 DIAGNOSIS — Z01810 Encounter for preprocedural cardiovascular examination: Secondary | ICD-10-CM | POA: Diagnosis not present

## 2024-04-22 NOTE — Progress Notes (Signed)
 Virtual Visit via Telephone Note   Because of Marcus [name] co-morbid illnesses, he is at least at moderate risk for complications without adequate follow up.  This format is felt to be most appropriate for this patient at this time.  Due to technical limitations with video connection Web designer), today's appointment will be conducted as an audio only telehealth visit, and Marcus [name] verbally agreed to proceed in this manner.   All issues noted in this document were discussed and addressed.  No physical exam could be performed with this format.  Evaluation Performed:  Preoperative cardiovascular risk assessment _____________   Date:  04/22/2024   Patient ID:  Marcus [name], DOB 19-Oct-1940, MRN [id number] Patient Location:  Home Provider location:   Office  Primary Care Provider:  Kip Righter, MD Primary Cardiologist:  Marcus Bihari, MD  Chief Complaint / Patient Profile  83 y.o. y/o male with a h/o AAS CAD, hypertension, idiopathic pulmonary fibrosis with chronic DOE, chronic diastolic HF, OSA, PVD and dyslipidemia who is pending bilateral brow ptosis repair, bilateral upper eyelid entropion repair, left tarsorrhaphy with Marcus [name] on 05/02/24 and presents today for telephonic preoperative cardiovascular risk assessment. History of Present Illness  Marcus [name] is a 83 y.o. male who presents via audio/video conferencing for a telehealth visit today.  Pt was last seen in cardiology clinic on 10/24/23 by Dr. Wilbert [name].  At that time Marcus [name] was doing well, he was noted to have mild chronic dyspnea on exertion and mild chronic lower extremity edema.  The patient is now pending procedure as outlined above. Since his last visit, he has remained stable from a cardiac standpoint.  On chart review patient was admitted to [facility] from 03/09/2024 through 03/11/2024 in setting of possible sepsis and UTI.  Patient was treated with antibiotics and discharged home.  Patient  today reports that this has completely resolved and he is doing well.  He reports that his breathing is at baseline given history of pulmonary fibrosis and he currently has no lower extremity edema.  Today he denies chest pain, shortness of breath, lower extremity edema, fatigue, palpitations, melena, hematuria, hemoptysis, diaphoresis, weakness, presyncope, syncope, orthopnea, and PND.  He is able to achieve greater than 4 METS of activity assisting with some household chores, completing ADLs, climbing of stairs and walking. Past Medical History    Past Medical History:  Diagnosis Date   Aortic insufficiency    Mild by echo 11/2023   Ascending aorta dilatation (HCC)    38mm gy echo 01/2020   Benign essential HTN 09/09/2013   Chronic kidney disease    Coronary artery disease 08/2013   S/P PCI of the LAD   Dyslipidemia    LDL goal < 70   Elevated fasting glucose    Fatty liver    Glaucoma    Gout    Hypertension    Joint pain    LAD (lymphadenopathy)    Obesity    PVD (peripheral vascular disease) (HCC) 06/12/2018     30-49% left common femoral stenosis   Vision abnormalities    Past Surgical History:  Procedure Laterality Date   ADENOIDECTOMY     CATARACT EXTRACTION     CORONARY ANGIOPLASTY WITH STENT PLACEMENT  2009   EYE SURGERY     TONSILLECTOMY     WISDOM TOOTH EXTRACTION     Allergies Allergies  Allergen Reactions   Pirfenidone  Rash   Ofev  [Nintedanib] Diarrhea   Home Medications    Prior  to Admission medications   Medication Sig Start Date End Date Taking? Authorizing Provider  allopurinol (ZYLOPRIM) 300 MG tablet Take 300 mg by mouth daily.    [provider]  amLODipine  (NORVASC ) 10 MG tablet Take 1 tablet (10 mg total) by mouth daily. 10/24/23   Marcus Marcus SAUNDERS, MD  aspirin  81 MG tablet Take 81 mg by mouth daily.    [provider]  atorvastatin  (LIPITOR) 80 MG tablet Take 1 tablet (80 mg total) by mouth daily. 10/24/23   Marcus Marcus SAUNDERS, MD   cholecalciferol (VITAMIN D3) 25 MCG (1000 UNIT) tablet Take 1,000 Units by mouth daily.    [provider]  Cyanocobalamin  (VITAMIN B-12 PO) Take 1 capsule by mouth daily.    [provider]  Dextromethorphan-guaiFENesin [facility] DM PO) Take 1 tablet by mouth daily.    [provider]  dorzolamide -timolol  (COSOPT ) 22.3-6.8 MG/ML ophthalmic solution Place 1 drop into the left eye 2 (two) times daily. 05/02/16   [provider]  ferrous sulfate  325 (65 FE) MG EC tablet Take 325 mg by mouth 3 (three) times daily with meals.    [provider]  metoprolol  succinate (TOPROL -XL) 100 MG 24 hr tablet Take with or immediately following a meal. 10/24/23   Marcus [name], Marcus SAUNDERS, MD  Netarsudil-Latanoprost  (ROCKLATAN) 0.02-0.005 % SOLN As directed in left eye    [provider]  nitroGLYCERIN (NITROSTAT) 0.4 MG SL tablet Place 0.4 mg under the tongue every 5 (five) minutes as needed for chest pain.    [provider]  Omega-3 Fatty Acids (OMEGA 3 PO) Take 2 capsules by mouth in the morning and at bedtime. 2000 mg BID    [provider]  pimecrolimus (ELIDEL) 1 % cream Apply 1 Application topically 2 (two) times daily. To affected area on the face 01/11/19   [provider]  tamsulosin  (FLOMAX ) 0.4 MG CAPS capsule Take 0.4 mg by mouth daily. 04/27/20   [provider]    Physical Exam    Vital Signs:  Marcus [name] does not have vital signs available for review today.  Given telephonic nature of communication, physical exam is limited. AAOx3. NAD. Normal affect.  Speech and respirations are unlabored.  Accessory Clinical Findings    None  Assessment & Plan    1.  Preoperative Cardiovascular Risk Assessment:  [name] perioperative risk of a major cardiac event is 6.6% according to the Revised Cardiac Risk Index (RCRI).  His functional capacity is good at 5.07 METs according to the Duke Activity Status Index  (DASI). Recommendations: According to ACC/AHA guidelines, no further cardiovascular testing needed.  The patient may proceed to surgery at acceptable risk.   Antiplatelet and/or Anticoagulation Recommendations: Regarding ASA therapy, we recommend continuation of ASA throughout the perioperative period.  However, if the surgeon feels that cessation of ASA is required in the perioperative period, it may be stopped 5-7 days prior to surgery with a plan to resume it as soon as felt to be feasible from a surgical standpoint in the post-operative period.   The patient was advised that if he develops new symptoms prior to surgery to contact our office to arrange for a follow-up visit, and he verbalized understanding.  A copy of this note will be routed to requesting surgeon.  Time:   Today, I have spent 10 minutes with the patient with telehealth technology discussing medical history, symptoms, and management plan.    Litha Lamartina D Zhane Donlan, NP  04/22/2024, 10:59 AM

## 2024-04-30 DIAGNOSIS — N289 Disorder of kidney and ureter, unspecified: Secondary | ICD-10-CM | POA: Diagnosis not present

## 2024-05-02 DIAGNOSIS — H57813 Brow ptosis, bilateral: Secondary | ICD-10-CM | POA: Diagnosis not present

## 2024-05-02 DIAGNOSIS — D485 Neoplasm of uncertain behavior of skin: Secondary | ICD-10-CM | POA: Diagnosis not present

## 2024-05-02 DIAGNOSIS — H02423 Myogenic ptosis of bilateral eyelids: Secondary | ICD-10-CM | POA: Diagnosis not present

## 2024-05-02 DIAGNOSIS — H02024 Mechanical entropion of left upper eyelid: Secondary | ICD-10-CM | POA: Diagnosis not present

## 2024-05-02 DIAGNOSIS — H16212 Exposure keratoconjunctivitis, left eye: Secondary | ICD-10-CM | POA: Diagnosis not present

## 2024-05-02 DIAGNOSIS — D23112 Other benign neoplasm of skin of right lower eyelid, including canthus: Secondary | ICD-10-CM | POA: Diagnosis not present

## 2024-05-02 DIAGNOSIS — H0279 Other degenerative disorders of eyelid and periocular area: Secondary | ICD-10-CM | POA: Diagnosis not present

## 2024-05-02 DIAGNOSIS — H02224 Mechanical lagophthalmos left upper eyelid: Secondary | ICD-10-CM | POA: Diagnosis not present

## 2024-05-28 DIAGNOSIS — L718 Other rosacea: Secondary | ICD-10-CM | POA: Diagnosis not present

## 2024-05-28 DIAGNOSIS — C44319 Basal cell carcinoma of skin of other parts of face: Secondary | ICD-10-CM | POA: Diagnosis not present

## 2024-05-31 DIAGNOSIS — Z23 Encounter for immunization: Secondary | ICD-10-CM | POA: Diagnosis not present

## 2024-06-20 ENCOUNTER — Telehealth: Payer: Self-pay | Admitting: *Deleted

## 2024-06-20 DIAGNOSIS — J849 Interstitial pulmonary disease, unspecified: Secondary | ICD-10-CM

## 2024-06-20 NOTE — Telephone Encounter (Signed)
 Copied from CRM [id number]. Topic: Appointments - Appointment Scheduling >> Jun 14, 2024 11:37 AM Rilla NOVAK wrote: Patient was inpatient in [place] x 3 days.  States he needs to see Dr Geronimo because he was diagnosed with lung issues (states sepsis??).  Does know he has an order for a PFT. But wants to talk with Dr Levin. Please call patient [phone].  I called and spoke with the pt  He is not acutely ill Was hospitalized end of June 2025 with PNA  I got him scheduled for PFT and ov with MR 07/02/24  Nothing further needed

## 2024-06-26 DIAGNOSIS — H02024 Mechanical entropion of left upper eyelid: Secondary | ICD-10-CM | POA: Diagnosis not present

## 2024-06-26 DIAGNOSIS — H02224 Mechanical lagophthalmos left upper eyelid: Secondary | ICD-10-CM | POA: Diagnosis not present

## 2024-06-26 DIAGNOSIS — H57813 Brow ptosis, bilateral: Secondary | ICD-10-CM | POA: Diagnosis not present

## 2024-06-26 DIAGNOSIS — D485 Neoplasm of uncertain behavior of skin: Secondary | ICD-10-CM | POA: Diagnosis not present

## 2024-06-26 DIAGNOSIS — H16212 Exposure keratoconjunctivitis, left eye: Secondary | ICD-10-CM | POA: Diagnosis not present

## 2024-06-26 DIAGNOSIS — H0279 Other degenerative disorders of eyelid and periocular area: Secondary | ICD-10-CM | POA: Diagnosis not present

## 2024-06-26 DIAGNOSIS — H02423 Myogenic ptosis of bilateral eyelids: Secondary | ICD-10-CM | POA: Diagnosis not present

## 2024-06-27 DIAGNOSIS — Z23 Encounter for immunization: Secondary | ICD-10-CM | POA: Diagnosis not present

## 2024-07-01 NOTE — Progress Notes (Unsigned)
 IOV 06/09/2020  Subjective:  Patient ID: Marcus [name], male , DOB: 1941-06-16 , age 83 y.o. , MRN: [id number] , ADDRESS: [address] [place] [address]  PCP Morgan Drivers, MD Cardiologist Dr. Randine Bihari   06/09/2020 -   Chief Complaint  Patient presents with   Consult    sob for the last year, worse for the last 6 month.     HPI Marcus [name] 83 y.o. -83 year old male with a history of atherosclerotic coronary artery disease [status post remote PCI of LAD], hypertension and hyperlipidemia.  History of transaminitis that resolved after stopping ethanol and Tylenol .  Lower extremity Dopplers in 2017 30-49% left common femoral arterial stenosis.  He saw Dr. Bihari cardiology in May 2021 with new onset of shortness of breath that is progressive.  Associated with exertional chest pressure.  At baseline he goes to [place] a lot and walks 3 miles [place] the beach but as of May 2021 he was only able to walk 5 feet and had to stop halfway through that walk.  Also notices walking up stairs relieved by rest.  Overall dyspnea on exertion for 1 year.  Worse in the last 6 months but he also says that he some better in the last few months.  Denies any proximal nocturnal dyspnea orthopnea or lower extremity edema dizziness or palpitations or syncope.  Because of this he underwent nuclear medicine cardiac stress test February 14, 2020 that was low risk with ejection fraction 65%.  But he has had persistent symptoms and therefore has been referred here.  He then had pulmonary function test April 02, 2020 that shows mild restriction with FVC 3.82 L / 75% total lung capacity 5.76/71% but normal DLCO.  But because of persistent symptoms has been referred here.  Of note his echo showed grade 1 diastolic dysfunction.  Of note he also has a chronic cough during the same time.  -He tells me that recently his [facility] duct died out and after that the cough improved when the [facility] duct was off.  He is now  going to get his [facility] duct evaluated.    He also reports 10 pound weight gain during this time.  Normally is around 290 pounds.  Now he is at 300 pounds.  He is also been more sedentary because of the pandemic.  He says during this time his wife has noticed that he occasionally snores.  He does fall easily fatigued during the daytime.  He also has disrupted sleep pattern.  I did a stop bang questionnaire and it is 6 out of 8 suggesting significantly elevated risk for sleep apnea.  He has never had a sleep study.`  He is going back to [place], [place] where he owns another home.  He will not be able to do test in the next few weeks.   Results for Marcus [name] (MRN [id number]) as of 06/09/2020 14:16  Ref. Range 03/23/2017 09:13  Please Note (HCV): Unknown Comment  Anti Nuclear Antibody (ANA) Latest Ref Range: Negative  Negative  ANCA Proteinase 3 Latest Ref Range: 0.0 - 3.5 U/mL <3.5  Atypical pANCA Latest Ref Range: Neg:<1:20 titer <1:20  ENA SSA (RO) Ab Latest Ref Range: 0.0 - 0.9 AI <0.2  ENA SSB (LA) Ab Latest Ref Range: 0.0 - 0.9 AI <0.2  Deamidated Gliadin Abs, IgG Latest Ref Range: 0 - 19 units 2  Myeloperoxidase Ab Latest Ref Range: 0.0 - 9.0 U/mL <9.0  RA Latex  Turbid. Latest Ref Range: 0.0 - 13.9 IU/mL <10.0  Transglutaminase IgA Latest Ref Range: 0 - 3 U/mL <2  Cytoplasmic (C-ANCA) Latest Ref Range: Neg:<1:20 titer <1:20  P-ANCA Latest Ref Range: Neg:<1:20 titer <1:20  Antigliadin Abs, IgA Latest Ref Range: 0 - 19 units 6  IgG (Immunoglobin G), Serum Latest Ref Range: 700 - 1,600 mg/dL 174  IgM (Immunoglobulin M), Srm Latest Ref Range: 15 - 143 mg/dL 873  IgA/Immunoglobulin A, Serum Latest Ref Range: 61 - 437 mg/dL 687  Lyme IgG/IgM Ab Latest Ref Range: 0.00 - 0.90 ISR <0.91    NP visit Nov 2021   07/23/2020  - Visit   83 year old male former smoker followed in our office for dyspnea on exertion and chronic cough.  He is established with Dr. Geronimo.  He was seen for an  initial consult in September/2021.  Plan of care from that consult with Dr. Geronimo was as follows: Obtain high-resolution CT chest, do simple walk test, CBC with differential, IgE.  Stop bang score 6.  Was referred to Dr. Shlomo for sleep study.  Patient presenting today as follow-up.  Patient reporting that he has been scheduled for home sleep study with cardiology on 08/11/2020.  Insurance would not pay for an in lab study.  Patient denies any sort of family history of lung disease.  He does report potential exposures when he was in the service for 2 years 1 of those years being in [place].  He reports a previous history of a primary care provider reporting that when he returned he may have had malaria and he was on malaria medications.  He is unsure of the validity of this.  Patient also worked in copper rod production for around 3 years.  He was around acid tanks for around 1 to 2 years during the rod washing.  He did not wear a mask.  He reports there is lots of smoke, copper dust as well as he to wear special close due to the exposures of the acid.  Patient is also currently dealing with an HVAC issue.  There is concerned there may be mold in this.  Both him and his wife have had respiratory symptoms of increased cough when the machine was running.  Is currently turned off and is being prepared to be cleaned.  He reports that the initial inspection by the HVAC team felt that there was no mold present.  Patient denies any persistent issues of acid reflux.  He does have a history of GERD and has a history of hiatal hernia.  He was treated with Nexium for around 6 to 8 weeks.  He reports he has been off this medication for many years.  He does not have breakthrough reflux symptoms or require Tums.  Patient continues to have aspects of shortness of breath occasionally.  Especially with physical exertion.  Patient also has an occasional cough.  Cough is more present in the morning when he is waking up.   Sometimes cough is productive.  He does have significant clear nasal drainage.  He is occasionally using a nasal medication that he believes is Flonase that his wife purchased over-the-counter.   OV 08/25/2020   Subjective:  Patient ID: Marcus [name], male , DOB: 25-Jun-1941, age 21 y.o. years. , MRN: [id number],  ADDRESS: [address] [place] [address] PCP  Morgan Drivers, MD Providers : Treatment Team:  Attending Provider: Geronimo Amel, MD Patient Care Team: Morgan Drivers, MD as PCP - General (Family  Medicine) Shlomo Wilbert SAUNDERS, MD as PCP - Cardiology (Cardiology)    Chief Complaint  Patient presents with   Follow-up    ILD, doing ok, occ cough with some mucous       HPI Marcus [name] 83 y.o. -returns for follow-up to discuss his test results.  He is here with his wife who I am meeting for the first time.  His high-resolution CT chest is interpreted as indeterminate for UIP.  I personally visualized this I personally thought it was probable UIP.  I took a second opinion from Dr. Marolyn Bollard thoracic radiologist who feels that patient could get classified as probable UIP on the CT scan.  Long discussion was held with the patient but he did not bring his ILD questionnaire.  He had to bring this after the visit.  Therefore parts of this dictation happened after his visit but on the same day.  His main concern is that he will die prematurely.  [facility] Comprehensive ILD Questionnaire  Symptoms: He tells me that he had insidious onset of shortness of breath for the last 12 months.  Since it started it is the same.  He has level 1 dyspnea for household work, shopping and walking at a level pace.  Level 2 dyspnea on walking up stairs.  He does have difficulty keeping up with others of his age.  He has neuropathy and hip soreness.  He also has a cough since March 2021.  It is the same since it started moderate in severity.  It is dry.  He does clear his throat and he  does feel a tickle in his throat.  There is no hemoptysis or sputum production.  No wheezing no nausea no vomiting no diarrhea.  The cough is mostly in the morning.  His symptoms do feel better when he stops activity for 5 minutes.  It makes him feel more short of breath when he does vigorous activity.  No associated symptoms.   Past Medical History : He has a history of coronary artery disease with 1 stent. He is undergoing a sleep apnea work-up.  He has chronic kidney disease stage III according to his history. Malaria in [place]. Polio as a kid   He does not have rheumatoid arthritis.  No scleroderma no lupus no polymyositis no Sjogren's.  No other vasculitis.  No thyroid  disease no diabetes.  No stroke no seizures.  No hepatitis no pneumonia.  His creatinine in May 2021 was 1.58 mg percent with a GFR of 42-48.  In October 2019 his creatinine was 1.35 mg percent.   Of note he says his dental hygienist has noted persistent redness in his hard palate.  He is asking me to refer him to ENT specialist and I have obliged  ROS: He does have arthralgia related to gout.  He has seborrhea.  He denies any oral ulcers or heartburn or vomiting or nausea.  Denies any weight loss.  He has obesity.  No recurrent fever no dryness.  No fatigue   FAMILY HISTORY of LUNG DISEASE: No pulmonary fibrosis no COPD.  His sister has autoimmune disease Brother has asthma   EXPOSURE HISTORY: He smokes cigarettes between 1960 and 1966 2 packs.  He also smoked cigars for 20 years.  No passive smoking.  No electronic cigarettes no marijuana no vaping no cocaine no intravenous drug use. Was      HOME and HOBBY DETAILS : Single-family home [place] for the last 10 years.  Age of the home is 25 years.  It is a 5 bedroom house.  There is no dampness.  No mold or mildew no humidifier use no CPAP use no nebulizer use.  No steam iron use no Jacuzzi use no misting Fountain.  No pet birds or parakeets no pet  gerbils or hamsters.  No feather pillows no mold in the [facility] duct.  No music habits.  No gardening habits.  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  During this time he was exposed to gas fumes and chemicals he reports that sulfuric acid copper out washing.  Otherwise negative for organic and inorganic antigens.   PULMONARY TOXICITY HISTORY (27 items): Negative     Radiology below were read by Dr. Newell Eke is indeterminate for UIP.  My personal opinion this is probable UIP.  Dr. Marolyn Bollard radiologist can also concur with me it is probable UIP   Narrative & Impression  CLINICAL DATA:  Shortness of breath. Productive cough and shortness of breath exertion for approximately 1 year.   EXAM: CT CHEST WITHOUT CONTRAST   TECHNIQUE: Multidetector CT imaging of the chest was performed following the standard protocol without intravenous contrast. High resolution imaging of the lungs, as well as inspiratory and expiratory imaging, was performed.   COMPARISON:  None.   FINDINGS: Cardiovascular: Atherosclerotic calcification of the aorta, aortic valve and coronary arteries. Pulmonic trunk is enlarged. Heart is at the upper limits of normal in size to mildly enlarged. No pericardial effusion.   Mediastinum/Nodes: No pathologically enlarged mediastinal or axillary lymph nodes. Hilar regions are difficult to evaluate without IV contrast. Esophagus is unremarkable.   Lungs/Pleura: Mild basilar predominant subpleural reticulation, ground-glass and traction bronchiectasis/bronchiolectasis. Findings persist on prone imaging. No definitive honeycombing. No air trapping. 3 mm left upper lobe nodule (3/57). 4 mm peripheral left lower lobe nodule (3/106). Mild centrilobular emphysema. No pleural fluid. Airway is unremarkable.   Upper Abdomen: Visualized portions of the liver,  gallbladder, adrenal glands, kidneys, spleen, pancreas, stomach and bowel are grossly unremarkable. No upper abdominal adenopathy.   Musculoskeletal: Degenerative changes in the spine.   IMPRESSION: 1. Mild basilar predominant subpleural fibrosis may be due to nonspecific interstitial pneumonitis or usual interstitial pneumonitis. Findings are indeterminate for UIP per consensus guidelines: Diagnosis of Idiopathic Pulmonary Fibrosis: An Official ATS/ERS/JRS/ALAT Clinical Practice Guideline. Am JINNY Honey Crit Care Med Vol 198, Iss 5, [phone], May 13 2017. 2. Pulmonary nodules measure up to 4 mm. No follow-up needed if patient is low-risk (and has no known or suspected primary neoplasm). Non-contrast chest CT can be considered in 12 months if patient is high-risk. This recommendation follows the consensus statement: Guidelines for Management of Incidental Pulmonary Nodules Detected on CT Images: From the Fleischner Society 2017; Radiology 2017; 284:228-243. 3. Aortic atherosclerosis (ICD10-I70.0). Coronary artery calcification. 4. Enlarged pulmonic trunk, indicative of pulmonary arterial hypertension. 5.  Emphysema (ICD10-J43.9).     Electronically Signed   By: Newell Eke M.D.   On: 07/15/2020 14:33    Results for SHAKEEM, STERN (MRN [id number]) as of 08/25/2020 10:28  Ref. Range 07/23/2020 09:56 07/23/2020 09:58  Anti Nuclear Antibody (ANA) Latest Ref Range: NEGATIVE  POSITIVE (A)   ANA Pattern 1 Unknown Nuclear, Homogeneous (A)   ANA Titer 1 Latest Units: titer 1:40 (H)   Cyclic Citrullin Peptide Ab Latest Units: UNITS <16  RA Latex Turbid. Latest Ref Range: <14 IU/mL <14   ANA,IFA RA DIAG PNL W/RFLX TIT/PATN Unknown Rpt (A)   Anti-Jo-1 Ab (RDL) Latest Ref Range: <20 Units  <20  Anti-PL-7 Ab (RDL) Latest Ref Range: Negative   Negative  Anti-PL-12 Ab (RDL) Latest Ref Range: Negative   Negative  Anti-EJ Ab (RDL) Latest Ref Range: Negative   Negative  Anti-OJ Ab (RDL)  Latest Ref Range: Negative   Negative  Anti-SRP Ab (RDL) Latest Ref Range: Negative   Negative  Anti-Mi-2 Ab (RDL) Latest Ref Range: Negative   Negative  Anti-TIF-1gamma Ab (RDL) Latest Ref Range: <20 Units  <20  Anti-MDA-5 Ab (CADM-140)(RDL) Latest Ref Range: <20 Units  <20  Anti-NXP-2 (P140) Ab (RDL) Latest Ref Range: <20 Units  <20  Anti-SAE1 Ab, IgG (RDL) Latest Ref Range: <20 Units  <20  Anti-PM/Scl-100 Ab (RDL) Latest Ref Range: <20 Units  <20  Anti-Ku Ab (RDL) Latest Ref Range: Negative   Negative  Anti-SS-A 52kD Ab, IgG (RDL) Latest Ref Range: <20 Units  <20  Anti-U1 RNP Ab (RDL) Latest Ref Range: <20 Units  <20  Anti-U2 RNP Ab (RDL) Latest Ref Range: Negative   Negative  Anti-U3 RNP (Fibrillarin)(RDL) Latest Ref Range: Negative   Negative  SSA (Ro) (ENA) Antibody, IgG Latest Ref Range: <1.0 NEG AI <1.0 NEG   SSB (La) (ENA) Antibody, IgG Latest Ref Range: <1.0 NEG AI <1.0 NEG   Scleroderma (Scl-70) (ENA) Antibody, IgG Latest Ref Range: <1.0 NEG AI <1.0 NEG     OV 10/28/2020  Subjective:  Patient ID: Marcus [name], male , DOB: 1941-03-09 , age 20 y.o. , MRN: [id number] , ADDRESS: [address] [place] [address] PCP Morgan Drivers, MD Patient Care Team: Morgan Drivers, MD as PCP - General (Family Medicine) Shlomo Wilbert SAUNDERS, MD as PCP - Cardiology (Cardiology)  This Provider for this visit: Treatment Team:  Attending Provider: Geronimo Amel, MD    10/28/2020 -   Chief Complaint  Patient presents with   Follow-up    Get pft results.  Gets bloody nose when he blows his nose, small amount of blood, just since starting Esbriet .    IPF dx 08/25/2020: High PRob  clinical suspicion is you have IPF based on age greater than 67, male gender, Caucasian ethnicity, negative serology profile, history of acid reflux, previous exposure to smoke and copper and prior history of smoking and prob UIP on CT.  Start esbriet  Januaryc 08, 2022 HPI Marcus [name] 83 y.o. -follow-up  idiopathic pulmonary fibrosis new diagnosis December 2021. We started him on pirfenidone  January 2022. He says he is tolerating it really well. His symptom score is stable. In fact he feels that pirfenidone  is helping him. He feels his lungs have opened up. He feels that the antifibrotic actions have kicked in. He does admit this could be psychological. He did complain of some bloody nose when he blows up. He wondered if it is because of pirfenidone . Explained to him pirfenidone  does not have any antiplatelet agents. Explained that likely this is due to the cold winter and dry air. He shuttles between [place] and [place]. Did explain to him that if he were to relocate to [place] I would set him up with excellent ILD specialist at [facility]. We discussed patient support group. He seems interested. We discussed pulmonary rehabilitation but he has back and spine issues and knee issues that make it difficult. He wants to reflect on this. We discussed clinical trials as a care  option including specifically all the details below. He wants to reflect on this but he feels that shuttling between [place] in [place] can be difficult to do clinical trials. However once is stable on the drug and sometimes past he might be able to do it.  There are no other new issues. Last visit I did indicate to him to consider stopping his omega-3 because of concern of omega-3 making acid reflux worse which is a risk factor for IPF. However he says it has helped his triglycerides. He wants to continue on this. His acid reflux is not super active. I have supported him in this decision.     PFT    OV 12/08/2020  Subjective:  Patient ID: Marcus [name], male , DOB: 23-Nov-1940 , age 40 y.o. , MRN: [id number] , ADDRESS: [address] [place] [address] PCP Morgan Drivers, MD Patient Care Team: Morgan Drivers, MD as PCP - General (Family Medicine) Shlomo Wilbert SAUNDERS, MD as PCP - Cardiology (Cardiology)  This  Provider for this visit: Treatment Team:  Attending Provider: Geronimo Amel, MD   IPF dx 08/25/2020 (reconfirmed MDD 11/17/20) : High PRob  clinical suspicion is you have IPF based on age greater than 8, male gender, Caucasian ethnicity, negative serology profile, history of acid reflux, previous exposure to smoke and copper and prior history of smoking and prob UIP on CT.   Start esbriet  Januaryc 08, 2022   12/08/2020 -   Chief Complaint  Patient presents with   Follow-up    Pt states he is doing okay since last visit and states breathing is about the same.     HPI Marcus [name] 83 y.o. -+ presents for follow-up.  Last visit was 1 month ago.  This visit was supposed to be a televisit but he showed up at the front door.  Therefore he preferred to do a face-to-face visit.  We discussed in the clinical conference and confirmation is that he is IPF.  He says he is tolerating pirfenidone  well.  He is going to start his third month of pirfenidone  in the first week of April 2022.  He says occasionally bites his tongue at night.  This is associated with starting losartan  6 months ago but is not on ACE inhibitor.  He will discuss this with the cardiologist.  Also says that tongue biting started after the pirfenidone  but it is not a known side effect of pirfenidone .  Overall he is feeling stable.  He is not interested in clinical trials of pulmonary rehabilitation at this point.  He shuttling between [place] in [place].  His symptom scores are listed below.    Telel visit 01/19/21  83 yo former smoker followed for IPF Medical history significant for coronary artery disease, hypertension and hyperlipidemia.  Today's telemedicine visit is for a 6-week follow-up.  Patient has presumed IPF with interstitial changes consistent with UIP on high-resolution CT chest.  Patient was started on Esbriet  in January 2022.  Patient says he is tolerating well.  He has had no nausea vomiting or diarrhea.   Appetite is good no weight loss.  Patient says he still gets short of breath with activities but tries to remain active.  He has minimum cough. Patient aware that he needs to return for lab work today.  Patient denies any flare of cough or shortness of breath.  No change in his activity level.    OV 05/28/2021  Subjective:  Patient ID: Marcus [name], male , DOB: 1940-11-02 , age  83 y.o. , MRN: [id number] , ADDRESS: [address] [place] [address] PCP Morgan Drivers, MD Patient Care Team: Morgan Drivers, MD as PCP - General (Family Medicine) Shlomo Wilbert SAUNDERS, MD as PCP - Cardiology (Cardiology)  This Provider for this visit: Treatment Team:  Attending Provider: Geronimo Amel, MD    05/28/2021 -   Chief Complaint  Patient presents with   Follow-up    Pt states he has been doing okay since last visit. States that he stopped taking Esbriet  about a month ago. Pt states that his breathing is about the same.    IPF dx 08/25/2020 (reconfirmed MDD 11/17/20) : High PRob  clinical suspicion is you have IPF based on age greater than 44, male gender, Caucasian ethnicity, negative serology profile, history of acid reflux, previous exposure to smoke and copper and prior history of smoking and prob UIP on CT.   Start esbriet  Januaryc 08, 2022 - stopped summer 2022 afte sun rash x 2 times   Starting ofev   HPI Marcus [name] 83 y.o. -       OV 08/18/2021  Subjective:  Patient ID: Marcus [name], male , DOB: 09-23-1940 , age 33 y.o. , MRN: [id number] , ADDRESS: [address] [place] [address] PCP Morgan Drivers, MD Patient Care Team: Morgan Drivers, MD as PCP - General (Family Medicine) Shlomo Wilbert SAUNDERS, MD as PCP - Cardiology (Cardiology)  This Provider for this visit: Treatment Team:  Attending Provider: Geronimo Amel, MD    08/18/2021 -   Chief Complaint  Patient presents with   Follow-up    F/U after starting Ofev . States his BP has increased since  starting Ofev  on November 1st. Has had occasional episodes of diarrhea.     IPF dx 08/25/2020 (reconfirmed MDD 11/17/20) : High PRob  clinical suspicion is you have IPF based on age greater than 108, male gender, Caucasian ethnicity, negative serology profile, history of acid reflux, previous exposure to smoke and copper and prior history of smoking and prob UIP on CT.   Start esbriet  Januaryc 08, 2022 - stopped summer 2022 afte sun rash x 2 times   Starting ofev   HPI Marcus [name] 83 y.o. -returns for follow-up.  In the interim because he failed pirfenidone  we switched him to nintedanib.  He is tolerating it well except for some mild diarrhea but 2 weeks after starting this his blood pressure was high the cardiology visit.  I reviewed the notes.  He started nintedanib around late October 2022/early November 2022 and by mid November there was increase in blood pressure [the third patient I am seeing with this issue this year].  Cardiologist Dr. Shlomo has added amlodipine  5 mg/day to the regimen with is not controlling the blood pressure.  I did advise him that he could contact and increase it to 10 mg/day with a side effect being edema.  He is going to look into this.  His respiratory status is stable.  He had liver function test monitoring and he has a new increase in ALT.  He is on allopurinol and Lipitor which can also increase his liver enzymes but he has been on this for a long time.  Only new intervention is nintedanib.  Very likely and nintedanib is increasing the liver enzymes but is only a mild enzyme increase in 1 enzyme which is ALT.  He is agreed for close monitoring.  I did not ask him about his alcohol intake.  He has not had PFTs.  We  are holding off on PFTs because of his recent eye surgery which has gone well but the recovery is slow but improving.   He had high-resolution CT scan of the chest recently in November 2022.  Compared to 1 year ago there is no progression.  There are no  other new findings.  And CT scan of the chest in 1 year.  Recent Labs  Lab 08/17/21 0916  AST 35  ALT 52*  ALKPHOS 60  BILITOT 0.5  PROT 6.5  ALBUMIN 4.3      CT Chest data 07/27/21  CLINICAL DATA:  83 year old male with history of interstitial lung disease. Follow-up study.   EXAM: CT CHEST WITHOUT CONTRAST   TECHNIQUE: Multidetector CT imaging of the chest was performed following the standard protocol without intravenous contrast. High resolution imaging of the lungs, as well as inspiratory and expiratory imaging, was performed.   COMPARISON:  Chest CT 07/15/2020.   FINDINGS: Cardiovascular: Heart size is normal. There is no significant pericardial fluid, thickening or pericardial calcification. There is aortic atherosclerosis, as well as atherosclerosis of the great vessels of the mediastinum and the coronary arteries, including calcified atherosclerotic plaque in the left main, left anterior descending, left circumflex and right coronary arteries.   Mediastinum/Nodes: No pathologically enlarged mediastinal or hilar lymph nodes. Please note that accurate exclusion of hilar adenopathy is limited on noncontrast CT scans. Esophagus is unremarkable in appearance. No axillary lymphadenopathy.   Lungs/Pleura: High-resolution images demonstrate areas of ground-glass attenuation, septal thickening, mild subpleural reticulation, mild cylindrical bronchiectasis, thickening of the peribronchovascular interstitium and regional architectural distortion and volume loss. These findings are most evident in the lung bases, particularly in the lower lobes. Bronchiectasis is now more apparent in these regions, but no other significant progression of disease when compared to the prior study. No honeycombing. Inspiratory and expiratory imaging is unremarkable.   Upper Abdomen: Aortic atherosclerosis.   Musculoskeletal: There are no aggressive appearing lytic or blastic lesions  noted in the visualized portions of the skeleton.   IMPRESSION: 1. The appearance of the lungs remains compatible with interstitial lung disease, with a spectrum of findings categorized as probable usual interstitial pneumonia (UIP) per current ATS guidelines. No significant progression of disease compared to the prior study. 2. Aortic atherosclerosis, in addition to left main and 3 vessel coronary artery disease. Please note that although the presence of coronary artery calcium  documents the presence of coronary artery disease, the severity of this disease and any potential stenosis cannot be assessed on this non-gated CT examination. Assessment for potential risk factor modification, dietary therapy or pharmacologic therapy may be warranted, if clinically indicated.   Aortic Atherosclerosis (ICD10-I70.0).     Electronically Signed   By: Toribio Aye M.D.   On: 07/28/2021 10:58    No results found.    09/10/2021 Patient was contacted today for virtual follow-up/ Clinically stable pulmonary fibrosis. He has been on OFEV  for 2 months. He was last seen by MR on 08/18/21. He had some mild diarrhea arou. BP elevated with cardiology, Dr. Shlomo added Amlodipine  5mg  daily. Patient reports that his blood pressure reading this morning was 140/62 compared to previous readings in 150-160/70s. He is still having diarrhea.  He will go 2-3 days without any GI symptoms. When diarrhea is severe he will take imodium which does help. On average he takes 6 imodium tablets a week. ALT on CMET was elevated on 08/17/21. He has been on Allopurional and lipitor for a long time. He did  a repeat lab test which showed LFTs which were back to normal. He need another hepatic functoin panel in 1 week. He consumes 2-3 glasses of wine a day and 2-3 pints of beer a week.       OV 10/14/2021  Subjective:  Patient ID: Marcus [name], male , DOB: 01-13-1941 , age 55 y.o. , MRN: [id number] , ADDRESS: [address] [place] [address] PCP Morgan Drivers, MD Patient Care Team: Morgan Drivers, MD as PCP - General (Family Medicine) Shlomo Wilbert SAUNDERS, MD as PCP - Cardiology (Cardiology)  This Provider for this visit: Treatment Team:  Attending Provider: Geronimo Amel, MD    10/14/2021 -   IPF follow-up on nintedanib.  Previous intolerance to pirfenidone . Chief Complaint  Patient presents with   Follow-up    Pt states he is about the same since last visit.     HPI Marcus [name] 83 y.o. -returns for follow-up.  He has intermittent mild transaminitis while on nintedanib.  This is no attributable to alcohol and also Lipitor.  Most recent liver function test is normal.  However he is having significant diarrhea.  He uses 6-7 Imodium each week.  He wants to stop nintedanib and give it a holiday for a few weeks and then reassess.  He did admit to drinking 2 cups of coffee a day and also 1 soda a week.  Beyond this he denies any other increased sugar intake or carbohydrate intake.  He does have high triglycerides and is on fish oil.  I recommended he stop fish oil and avoid coffee to help with his diarrhea.  He says he will stop the fish oil at least temporarily although is worried about the impact on his triglycerides.  I did indicate to him the best treatment for hypertriglyceridemia is low carbohydrate food.  In any event he wants to give nintedanib holiday.  We agreed to do a 2-week holiday.  He from a respiratory standpoint is stable.  His walking desaturation test is stable.  We are holding off on pulmonary function test at the moment because of recent cataract surgery.  He does have significant cough and is on Mucinex.  He says he has had 4 shots of COVID-vaccine.  Is wondering whether he should get another booster.  I indicated to him the current active strain is from [place] the BX strain.  There is no mRNA vaccine for this.  Therefore we took a shared decision making to hold off.  However if he got  COVID we will do antiviral.    .    OV 01/03/2022  Subjective:  Patient ID: Marcus [name], male , DOB: 05-04-1941 , age 37 y.o. , MRN: [id number] , ADDRESS: [address] [place] [address] PCP Morgan Drivers, MD Patient Care Team: Morgan Drivers, MD as PCP - General (Family Medicine) Shlomo Wilbert SAUNDERS, MD as PCP - Cardiology (Cardiology)  This Provider for this visit: Treatment Team:  Attending Provider: Geronimo Amel, MD    01/03/2022 -   Chief Complaint  Patient presents with   Follow-up    PFT performed today.  Pt states he is about the same since last visit. Pt stopped taking OFEV  about 1 month ago due to having side effect of diarrhea.    HPI Marcus [name] 83 y.o. -returns for follow-up.  He tells me that he could not tolerate nintedanib because of all the side effects.  He says he stopped taking nintedanib over a month ago and his  quality of life is much better.  At this point in time he just wants to do expectant follow-up.  We discussed clinical trials as a care option but he finds it too restrictive on his lifestyle.  We discussed protocol with inhaled treprostinil is a phase 3 study for IPF.  It is less intense than the other studies.  However he does not like the idea that he has to do a nebulizer 4 times daily and each time at least 9-12 times puffs.  However he took the consent form for this.  He is currently taking some kind of a gummy that increases metabolism and he says it makes him feel better.  He wants to lose weight.  We discussed Ozempic.  He says he will bring this up with his primary care physician.       OV 06/14/2022  Subjective:  Patient ID: Marcus [name], male , DOB: 1940/11/07 , age 74 y.o. , MRN: [id number] , ADDRESS: [address] [place] [address] PCP Morgan Drivers, MD Patient Care Team: Morgan Drivers, MD as PCP - General (Family Medicine) Shlomo Wilbert SAUNDERS, MD as PCP - Cardiology (Cardiology)  This Provider for this  visit: Treatment Team:  Attending Provider: Geronimo Amel, MD  06/14/2022 -   Chief Complaint  Patient presents with   Follow-up    Spiro/DLCO done today. Breathing is unchanged since the last visit. He tends to have a cough in the morning- non prod.      HPI Marcus [name] 83 y.o. -IVF follow-up.  He is on supportive care.  He presents with his wife who I am meeting for the first time.  At this point in time he feels his dyspnea is stable.  He says he has been dealing with other health issues that include obesity.  He has been on a keto diet and lost 35 pounds.  He has not entertained some of the newer medications.  He also deals with significant back pain and is using a cane.  He is also had facial swelling and has had a CT scan and apparently there is a dental abscess.  He is also needing tarsorrhaphy along with suffering from glaucoma.  This eye surgery was postponed because his wife had COVID.  Is not exposed to his wife with COVID.  His wife had COVID while she was traveling to [place] but the eye doctors misunderstood this and postponed his eye surgery.  At this point in time he is not interested in clinical trial.  Previously intolerant to approved antifibrotic's.  In the future he could be interested in clinical trials.  He wanted a handicap placard which we did for him.  CT Chest data   OV 09/20/2022  Subjective:  Patient ID: Marcus [name], male , DOB: 1941-06-25 , age 54 y.o. , MRN: [id number] , ADDRESS: [address] [place] [address] PCP Kip Righter, MD Patient Care Team: Kip Righter, MD as PCP - General (Family Medicine) Shlomo Wilbert SAUNDERS, MD as PCP - Cardiology (Cardiology)  This Provider for this visit: Treatment Team:  Attending Provider: Geronimo Amel, MD     09/20/2022 -   Chief Complaint  Patient presents with   Follow-up    Cough persistent.  Worse in mornings     HPI Marcus [name] 83 y.o. -returns for follow-up.  Presents with his  wife.  History is given by him but also review of the records external and also by the wife who is being an independent  historian.  After his last visit in December 2023 he was admitted with parotid abscess on the left side.  I reviewed the note.  He did have some acute kidney injury and this went back to baseline.  He has since seen primary care doctor.  Also some mild anemia.  In terms of his shortness of breath he feels he is stable.  Symptom scores are stable.  Walking desaturation test he stopped a little bit more prematurely because he was tired but there is no chest pain or worsening shortness of breath.  He had pulmonary function test that shows his FVC is declined on 8% but his DLCO is stable.  He still has only mild disease.  We again discussed approach to IPF.  He again prefers to have supportive care and expectant approach with serial monitoring.  He is not interested in rechallenge with antifibrotic's or trying an interventional clinical trial at this time  We discussed the risk of osteoporosis in older men and also potential that the eye PF is an independent risk factor for osteoporosis.  [employer] is offering patient's a DEXA bone scan as part of the recent study.  He is interested in this.  Wife wanted to review weight loss options.  In particular he feels the weighing scale today is out of calibration.  He says he gave him the wrong weight of 280 pounds.  Recently weighed 10 pounds lighter at the primary care and his personal home.  He is no longer doing the keto diet.  He is trying to visit with a nutritionist.  We did discuss the new weight loss drugs.  Asked him to talk about this with the primary care doctor.    OV 08/25/2023  Subjective:  Patient ID: Marcus [name], male , DOB: Jul 04, 1941 , age 63 y.o. , MRN: [id number] , ADDRESS: [address] [place] [address] PCP Kip Righter, MD Patient Care Team: Kip Righter, MD as PCP - General (Family Medicine) Shlomo Wilbert SAUNDERS, MD as PCP - Cardiology (Cardiology)  This Provider for this visit: Treatment Team:  Attending Provider: Geronimo Amel, MD  08/25/2023 -   Chief Complaint  Patient presents with   Follow-up    Breathing is stable. He has some cough with ? Color sputum in the am.      HPI Marcus [name] 83 y.o. -returns for follow-up.  Last seen in January 2024.  Since the last 1 and half years he is been on supportive care.  He is declined to do antifibrotic's because of side effect profile.  He says he continues to feel stable.  See symptom score below.  His major problem appears to be his obesity.  He tried to go on Ozempic but apparently it was denied by insurance.  I encouraged him to talk to primary care physician and use justification of pulmonary fibrosis and also heart disease.  He is going to try to do that.  Otherwise doing well.  He again prefers to be on supportive care particularly because he is stable.  His pulmonary function test also shows stability for the last year or 2.  He is not interested in clinical trials as a care option because he is feeling well.  Last CT chest was a year ago.  He is now over 33 years of age.  Social issues - His wife went to [place] and got COVID.  He says many members of his family of gotten COVID but he is here to get COVID  OV 07/02/2024  Subjective:  Patient ID: Marcus [name], male , DOB: July 07, 1941 , age 22 y.o. , MRN: [id number] , ADDRESS: [address] [place] [address] PCP Kip Righter, MD Patient Care Team: Kip Righter, MD as PCP - General (Family Medicine) Shlomo Wilbert SAUNDERS, MD as PCP - Cardiology (Cardiology)  This Provider for this visit: Treatment Team:  Attending Provider: Geronimo Amel, MD    07/02/2024 -   Chief Complaint  Patient presents with   Interstitial Lung Disease    Hospital f/u 03/08/2024- 03/11/2024 Pt stated since LOV breathing has been about the same  SOB occurs always w/ exertion   Dry cough in the morning, or when sitting down or laying down for long period of times     IPF dx 08/25/2020 (reconfirmed MDD 11/17/20) : High PRob  clinical suspicion is you have IPF based on age greater than 70, male gender, Caucasian ethnicity, negative serology profile, history of acid reflux, previous exposure to smoke and copper and prior history of smoking and prob UIP on CT.   -On October 2023 visit the wife recollected mold in the house was in [place] in the 1990s.  There is also a mold recently and he got rid of it.   IPF follow-up - Previous intolerance to pirfenidone . - Quit Ofev  March 2023 - declined participation in cough study for IPF March 2023 - On expectant approach since sprim/summer 2023   - last CT Nov 2022 and then December 2023l June 2025 [facility]   HPI Marcus [name] 83 y.o. -presents for IPF follow-up.  Is on supportive care.  I personally last saw him almost a year ago [10 months ago].  In the interim he was hospitalized for 3 days ending March 11, 2024 at [facility] by the hospitalist service for sepsis and it states that meningitis was ruled out.  There is a potential viral process.  They gave him some empiric cefdinir.  He was dehydrated and blood pressure was low.  Last set of labs on external medical record review was at [facility] March 10, 2024 where he was anemic with a hemoglobin 12 g% and elevated creatinine of 1.4 mg percent.  [In February his creatinine was 1.98 mg percent.  At [facility] they did do an echocardiogram that on external record review shows it to be normal.  A CT neck that was normal.  He appears to have had a lumbar puncture.  He had a normal renal ultrasound.*Stable he is getting short he had a CT chest without contrast that reported as no pneumonia but mild interstitial lung disease. No nodule.*.  He is telling me that he does not want to do CBC and Creat because PCP did it   In terms of his IPF his FVC and DLCO are stable for the  last 2 years but decline compared to 2-1/2 and 3 years ago.  Personally visualized.  In terms of his symptoms of shortness of breath persist.  It appears out of proportion.  He has declined pulmonary rehabilitation.   SYMPTOM SCALE -  06/09/2020  08/25/2020  10/28/2020  12/08/2020  08/18/2021  10/14/2021  01/03/2022  06/14/2022  09/20/2022  08/25/2023 Supportive care 07/02/2024   O2 use ra ra ra ra ra a ra ra ra  ra  Shortness of Breath 0 -> 5 scale with 5 being worst (score 6 If unable to do)    0        At rest 0 0 0 0 1.5  0 0 0 0 0 0  Simple tasks - showers, clothes change, eating, shaving 1 0 1 2 2 2 2 2 2 2  2  Household (dishes, doing bed, laundry) 2 1 2 3 1 3 3 3 1 1 3   Shopping 2 2 1 1  1. 2 2 3 1 1 2   Walking level at own pace 3 2 1 2  1.5 2 1 3 2 2 1   Walking up Stairs 3 2 2 2 2 3 1 3 1 2 3   Total (30-36) Dyspnea Score 11 7 7 10 9 12 9 14 7 8 11   How bad is your cough? 2 4 2 2 3 3 1 1 3 1 3   How bad is your fatigue 4 3 2 2 2 2 2 2 2 1 2   How bad is nausea 0 0 0 0 0 0 0 0 0 0  0  How bad is vomiting?  00 0 0 0 0 0 0 0 0 0  0  How bad is diarrhea? 0 0 0 0 2 3 0 0 0 0  0  How bad is anxiety? 0 0 0 0 0 0 0 0 0 0  0  How bad is depression 0 0 0 0 0 0 0 1 0 0  1          SIT STAND TEST - goal 15 times   07/02/2024    O2 used ra   PRobe - finter or forehead finger   Number sit and stand completed - goal 15 15   Time taken to complete 50 sec   Resting Pulse Ox/HR/Dyspnea  99% and 61/min and dyspnea of 0/10    Peak measures 99 % and 86/min and dyspnea of 5/10   Final Pulse Ox/HR 98% and 77/min and dyspnea of 4/10   Desaturated </= 88% no   Desaturated <= 3% points no   Got Tachycardic >/= 90/min no   Miscellaneous comments dyspneic      PFT     Latest Ref Rng & Units 07/02/2024    8:28 AM 08/24/2023    1:56 PM 09/20/2022    8:56 AM 06/14/2022    9:49 AM 01/03/2022    2:49 PM 10/28/2020    9:00 AM 04/02/2020    9:46 AM  PFT Results  FVC-Pre L 3.58  P 3.52  3.46  3.78   3.67  3.69  3.82   FVC-Predicted Pre % 78  P 76  74  81  78  74  75   FVC-Post L      3.68  3.85   FVC-Predicted Post %      73  76   Pre FEV1/FVC % % 80  P 81  81  81  81  80  80   Post FEV1/FCV % %      83  83   FEV1-Pre L 2.85  P 2.85  2.81  3.07  2.96  2.94  3.04   FEV1-Predicted Pre % 87  P 86  84  92  87  81  83   FEV1-Post L      3.06  3.20   DLCO uncorrected ml/min/mmHg 19.10  P 20.13  21.97  19.97  20.48  21.84  23.34   DLCO UNC% % 71  P 74  81  73  75  76  81   DLCO corrected ml/min/mmHg  20.13  24.82  19.97  20.48  21.84  23.34   DLCO COR %Predicted %  74  91  73  75  76  81   DLVA Predicted % 91  P 94  119  90  93  99  103   TLC L      5.72  5.76   TLC % Predicted %      70  71   RV % Predicted %      72  72     P Preliminary result       LAB RESULTS last 96 hours No results found.       has a past medical history of Aortic insufficiency, Ascending aorta dilatation, Benign essential HTN (09/09/2013), Chronic kidney disease, Coronary artery disease (08/2013), Dyslipidemia, Elevated fasting glucose, Fatty liver, Glaucoma, Gout, Hypertension, Joint pain, LAD (lymphadenopathy), Obesity, PVD (peripheral vascular disease) (06/12/2018), and Vision abnormalities.   reports that he quit smoking about 4 years ago. His smoking use included cigars and cigarettes. He started smoking about 10 years ago. He has a 20 pack-year smoking history. He has never used smokeless tobacco.  Past Surgical History:  Procedure Laterality Date   ADENOIDECTOMY     CATARACT EXTRACTION     CORONARY ANGIOPLASTY WITH STENT PLACEMENT  2009   EYE SURGERY     TONSILLECTOMY     WISDOM TOOTH EXTRACTION      Allergies  Allergen Reactions   Pirfenidone  Rash   Ofev  [Nintedanib] Diarrhea    Immunization History  Administered Date(s) Administered   Fluad Quad(high Dose 65+) 05/13/2020   INFLUENZA, HIGH DOSE SEASONAL PF 06/06/2019, 08/13/2021, 05/14/2023   Influenza-Unspecified 06/08/2020    PFIZER(Purple Top)SARS-COV-2 Vaccination 09/28/2019, 10/19/2019, 06/08/2020, 02/05/2021   Pfizer(Comirnaty)Fall Seasonal Vaccine 12 years and older 06/27/2024   Pneumococcal Conjugate-13 10/23/2014   Pneumococcal Polysaccharide-23 08/27/2012   Tdap 08/27/2012   Zoster, Live 08/29/2012, 09/28/2019, 03/11/2020    Family History  Problem Relation Age of Onset   Breast cancer Mother    Heart attack Father    Rheumatic fever Father    CAD Sister    Multiple sclerosis Sister    CAD Brother    Neuropathy Neg Hx      Current Outpatient Medications:    allopurinol (ZYLOPRIM) 300 MG tablet, Take 300 mg by mouth daily., Disp: , Rfl:    amLODipine  (NORVASC ) 10 MG tablet, Take 1 tablet (10 mg total) by mouth daily., Disp: 90 tablet, Rfl: 3   aspirin  81 MG tablet, Take 81 mg by mouth daily., Disp: , Rfl:    atorvastatin  (LIPITOR) 80 MG tablet, Take 1 tablet (80 mg total) by mouth daily., Disp: 90 tablet, Rfl: 3   cholecalciferol (VITAMIN D3) 25 MCG (1000 UNIT) tablet, Take 1,000 Units by mouth daily., Disp: , Rfl:    Cyanocobalamin  (VITAMIN B-12 PO), Take 1 capsule by mouth daily., Disp: , Rfl:    Dextromethorphan-guaiFENesin (MUCINEX DM PO), Take 1 tablet by mouth daily., Disp: , Rfl:    dorzolamide -timolol  (COSOPT ) 22.3-6.8 MG/ML ophthalmic solution, Place 1 drop into the left eye 2 (two) times daily., Disp: , Rfl:    ferrous sulfate  325 (65 FE) MG tablet, Take 325 mg by mouth daily with breakfast., Disp: , Rfl:    metoprolol  succinate (TOPROL -XL) 100 MG 24 hr tablet, Take with or immediately following a meal., Disp: 90 tablet, Rfl: 3   Netarsudil-Latanoprost  (ROCKLATAN) 0.02-0.005 % SOLN, As directed in left eye, Disp: , Rfl:    Omega-3 Fatty Acids (OMEGA 3 PO), Take 2 capsules by mouth in the morning and at bedtime. 2000  mg BID, Disp: , Rfl:    tamsulosin  (FLOMAX ) 0.4 MG CAPS capsule, Take 0.4 mg by mouth daily., Disp: , Rfl:    ferrous sulfate  325 (65 FE) MG EC tablet, Take 325 mg by mouth 3  (three) times daily with meals. (Patient not taking: Reported on 07/02/2024), Disp: , Rfl:    nitroGLYCERIN (NITROSTAT) 0.4 MG SL tablet, Place 0.4 mg under the tongue every 5 (five) minutes as needed for chest pain. (Patient not taking: Reported on 07/02/2024), Disp: , Rfl:    pimecrolimus (ELIDEL) 1 % cream, Apply 1 Application topically 2 (two) times daily. To affected area on the face (Patient not taking: Reported on 07/02/2024), Disp: , Rfl:  No current facility-administered medications for this visit.  Facility-Administered Medications Ordered in Other Visits:    technetium tetrofosmin  (TC-MYOVIEW ) injection 30.3 millicurie, 30.3 millicurie, Intravenous, Once PRN, Turner, Traci R, MD      Objective:   Vitals:   07/02/24 0939  BP: 136/64  Pulse: (!) 57  Temp: 98 F (36.7 C)  TempSrc: Oral  SpO2: 99%  Weight: 299 lb (135.6 kg)  Height: 6' 1.5 (1.867 m)    Estimated body mass index is 38.91 kg/m as calculated from the following:   Height as of this encounter: 6' 1.5 (1.867 m).   Weight as of this encounter: 299 lb (135.6 kg).  @WEIGHTCHANGE @  Filed Weights   07/02/24 0939  Weight: 299 lb (135.6 kg)     Physical Exam   General: No distress. Looks well O2 at rest: no Cane present: no Sitting in wheel chair: no Frail: no Obese: YES Neuro: Alert and Oriented x 3. GCS 15. Speech normal Psych: Pleasant Resp:  Barrel Chest - no.  Wheeze - no, Crackles - no, No overt respiratory distress CVS: Normal heart sounds. Murmurs - no Ext: Stigmata of Connective Tissue Disease - no HEENT: Normal upper airway. PEERL +. No post nasal drip        Assessment/     Assessment & Plan ILD (interstitial lung disease) (HCC)  IPF (idiopathic pulmonary fibrosis) [facility])  Hospital discharge follow-up  Chronic kidney disease, unspecified CKD stage  History of anemia    PLAN Patient Instructions     ICD-10-CM   1. IPF (idiopathic pulmonary fibrosis) (HCC)  J84.112      2. Obesity (BMI 30.0-34.9)  E66.9     3. Anemia, unspecified type  D64.9     4. Stage 3a chronic kidney disease (HCC)  N18.31       IPF (idiopathic pulmonary fibrosis) (HCC) Formr smoker  - stable x 2.5 years but progressive from before that  - on supportive care  Plan  - continue supportive care per shared decision making -no clinical trials at this moment part of shared decision making -Respect declining pulmonary rehabilitation. - 6 months do spiro/dlco - At follow-up we can decide on Nerandomilast [deferred for this visit]   Obesity (BMI 30.0-34.9)  - too bad ozempic was denied  Plan  - *Managed through primary care physician.  Anemia, unspecified type Stage 3a chronic kidney disease (HCC)  -Noted that you declined blood work with us  today because primary care physician is monitoring this.  Plan  - per PCP Kip Righter, MD   Followup - Do spirometry and DLCO in 6 months -Return to see Dr. Geronimo in 6 months  -Symptom score and walk test at follow-up   - 15-minute visit    FOLLOWUP    Return for [facility] in 6  months 15-minute visit but after spirometry and DLCO.SABRA    SIGNATURE    Dr. Dorethia Cave, M.D., F.C.C.P,  Pulmonary and Critical Care Medicine Staff Physician, [facility] Center Director - Interstitial Lung Disease  Program  Pulmonary Fibrosis [facility] at [facility] [place], [place], [address]  Pager: [phone], If no answer or between  15:00h - 7:00h: call [phone] Telephone: [phone]  10:11 AM 07/02/2024   Moderate Complexity MDM OFFICE  2021 E/M guidelines, first released in 2021, with minor revisions added in 2023 and 2024 Must meet the requirements for 2 out of 3 dimensions to qualify.    Number and complexity of problems addressed Amount and/or complexity of data reviewed Risk of complications and/or morbidity  One or more chronic illness with mild exacerbation, OR  progression, OR  side effects of treatment  Two or more stable chronic illnesses  One undiagnosed new problem with uncertain prognosis  One acute illness with systemic symptoms   One Acute complicated injury Must meet the requirements for 1 of 3 of the categories)  Category 1: Tests and documents, historian  Any combination of 3 of the following:  Assessment requiring an independent historian  Review of prior external note(s) from each unique source  Review of results of each unique test  Ordering of each unique test    Category 2: Interpretation of tests   Independent interpretation of a test performed by another physician/other qualified health care professional (not separately reported)  Category 3: Discuss management/tests  Discussion of management or test interpretation with external physician/other qualified health care professional/appropriate source (not separately reported) Moderate risk of morbidity from additional diagnostic testing or treatment Examples only:  Prescription drug management  Decision regarding minor surgery with identfied patient or procedure risk factors  Decision regarding elective major surgery without identified patient or procedure risk factors  Diagnosis or treatment significantly limited by social determinants of health             HIGh Complexity  OFFICE   2021 E/M guidelines, first released in 2021, with minor revisions added in 2023. Must meet the requirements for 2 out of 3 dimensions to qualify.    Number and complexity of problems addressed Amount and/or complexity of data reviewed Risk of complications and/or morbidity  Severe exacerbation of chronic illness  Acute or chronic illnesses that may pose a threat to life or bodily function, e.g., multiple trauma, acute MI, pulmonary embolus, severe respiratory distress, progressive rheumatoid arthritis, psychiatric illness with potential threat to self or others,  peritonitis, acute renal failure, abrupt change in neurological status Must meet the requirements for 2 of 3 of the categories)  Category 1: Tests and documents, historian  Any combination of 3 of the following:  Assessment requiring an independent historian  Review of prior external note(s) from each unique source  Review of results of each unique test  Ordering of each unique test    Category 2: Interpretation of tests    Independent interpretation of a test performed by another physician/other qualified health care professional (not separately reported)  Category 3: Discuss management/tests  Discussion of management or test interpretation with external physician/other qualified health care professional/appropriate source (not separately reported)  HIGH risk of morbidity from additional diagnostic testing or treatment Examples only:  Drug therapy requiring intensive monitoring for toxicity  Decision for elective major surgery with identified pateint or procedure risk factors  Decision regarding hospitalization or escalation  of level of care  Decision for DNR or to de-escalate care   Parenteral controlled  substances            LEGEND - Independent interpretation involves the interpretation of a test for which there is a CPT code, and an interpretation or report is customary. When a review and interpretation of a test is performed and documented by the provider, but not separately reported (billed), then this would represent an independent interpretation. This report does not need to conform to the usual standards of a complete report of the test. This does not include interpretation of tests that do not have formal reports such as a complete blood count with differential and blood cultures. Examples would include reviewing a chest radiograph and documenting in the medical record an interpretation, but not separately reporting (billing) the interpretation of the  chest radiograph.   An appropriate source includes professionals who are not health care professionals but may be involved in the management of the patient, such as a Clinical research associate, upper officer, case manager or teacher, and does not include discussion with family or informal caregivers.    - SDOH: SDOH are the conditions in the environments where people are born, live, learn, work, play, worship, and age that affect a wide range of health, functioning, and quality-of-life outcomes and risks. (e.g., housing, food insecurity, transportation, etc.). SDOH-related Z codes ranging from Z55-Z65 are the ICD-10-CM diagnosis codes used to document SDOH data Z55 - Problems related to education and literacy Z56 - Problems related to employment and unemployment Z57 - Occupational exposure to risk factors Z58 - Problems related to physical environment Z59 - Problems related to housing and economic circumstances [phone] - Problems related to social environment [phone] - Problems related to upbringing [phone] - Other problems related to primary support group, including family circumstances Z82 - Problems related to certain psychosocial circumstances Z65 - Problems related to other psychosocial circumstances

## 2024-07-02 ENCOUNTER — Encounter: Payer: Self-pay | Admitting: Internal Medicine

## 2024-07-02 ENCOUNTER — Ambulatory Visit: Admitting: Internal Medicine

## 2024-07-02 ENCOUNTER — Ambulatory Visit

## 2024-07-02 VITALS — BP 136/64 | HR 57 | Temp 98.0°F | Ht 73.5 in | Wt 299.0 lb

## 2024-07-02 DIAGNOSIS — J849 Interstitial pulmonary disease, unspecified: Secondary | ICD-10-CM

## 2024-07-02 DIAGNOSIS — J84112 Idiopathic pulmonary fibrosis: Secondary | ICD-10-CM | POA: Diagnosis not present

## 2024-07-02 DIAGNOSIS — N189 Chronic kidney disease, unspecified: Secondary | ICD-10-CM

## 2024-07-02 DIAGNOSIS — Z09 Encounter for follow-up examination after completed treatment for conditions other than malignant neoplasm: Secondary | ICD-10-CM

## 2024-07-02 DIAGNOSIS — Z862 Personal history of diseases of the blood and blood-forming organs and certain disorders involving the immune mechanism: Secondary | ICD-10-CM

## 2024-07-02 LAB — PULMONARY FUNCTION TEST
DL/VA % pred: 91 %
DL/VA: 3.48 ml/min/mmHg/L
DLCO unc % pred: 71 %
DLCO unc: 19.1 ml/min/mmHg
FEF 25-75 Pre: 2.85 L/s
FEF2575-%Pred-Pre: 129 %
FEV1-%Pred-Pre: 87 %
FEV1-Pre: 2.85 L
FEV1FVC-%Pred-Pre: 111 %
FEV6-%Pred-Pre: 82 %
FEV6-Pre: 3.53 L
FEV6FVC-%Pred-Pre: 105 %
FVC-%Pred-Pre: 78 %
FVC-Pre: 3.58 L
Pre FEV1/FVC ratio: 80 %
Pre FEV6/FVC Ratio: 99 %

## 2024-07-02 NOTE — Patient Instructions (Addendum)
 ICD-10-CM   1. IPF (idiopathic pulmonary fibrosis) (HCC)  J84.112     2. Obesity (BMI 30.0-34.9)  E66.9     3. Anemia, unspecified type  D64.9     4. Stage 3a chronic kidney disease (HCC)  N18.31       IPF (idiopathic pulmonary fibrosis) (HCC) Formr smoker  - stable x 2.5 years but progressive from before that  - on supportive care  Plan  - continue supportive care per shared decision making -no clinical trials at this moment part of shared decision making -Respect declining pulmonary rehabilitation. - 6 months do spiro/dlco - At follow-up we can decide on Nerandomilast [deferred for this visit]   Obesity (BMI 30.0-34.9)  - too bad ozempic was denied  Plan  - *Managed through primary care physician.  Anemia, unspecified type Stage 3a chronic kidney disease (HCC)  -Noted that you declined blood work with us  today because primary care physician is monitoring this.  Plan  - per PCP Kip Righter, MD   Followup - Do spirometry and DLCO in 6 months -Return to see Dr. Geronimo in 6 months  -Symptom score and walk test at follow-up   - 15-minute visit

## 2024-07-02 NOTE — Progress Notes (Signed)
Spiro/DLCO performed today. 

## 2024-07-02 NOTE — Patient Instructions (Signed)
Spiro/DLCO performed today. 

## 2024-07-03 DIAGNOSIS — H401113 Primary open-angle glaucoma, right eye, severe stage: Secondary | ICD-10-CM | POA: Diagnosis not present

## 2024-07-03 DIAGNOSIS — H401123 Primary open-angle glaucoma, left eye, severe stage: Secondary | ICD-10-CM | POA: Diagnosis not present

## 2024-07-30 DIAGNOSIS — Z85828 Personal history of other malignant neoplasm of skin: Secondary | ICD-10-CM | POA: Diagnosis not present

## 2024-07-30 DIAGNOSIS — Z08 Encounter for follow-up examination after completed treatment for malignant neoplasm: Secondary | ICD-10-CM | POA: Diagnosis not present

## 2024-11-04 ENCOUNTER — Ambulatory Visit: Admitting: Cardiology

## 2024-11-20 ENCOUNTER — Ambulatory Visit (HOSPITAL_COMMUNITY)
